# Patient Record
Sex: Female | Born: 1950 | Race: White | Hispanic: No | Smoking: Current every day smoker
Health system: Southern US, Community
[De-identification: ages and names within clinical notes are randomized; demographics above are authoritative.]

## PROBLEM LIST (undated history)

## (undated) DIAGNOSIS — J449 Chronic obstructive pulmonary disease, unspecified: Secondary | ICD-10-CM

## (undated) DIAGNOSIS — F17200 Nicotine dependence, unspecified, uncomplicated: Secondary | ICD-10-CM

## (undated) DIAGNOSIS — J189 Pneumonia, unspecified organism: Secondary | ICD-10-CM

## (undated) DIAGNOSIS — J45909 Unspecified asthma, uncomplicated: Secondary | ICD-10-CM

## (undated) DIAGNOSIS — M810 Age-related osteoporosis without current pathological fracture: Secondary | ICD-10-CM

## (undated) DIAGNOSIS — J439 Emphysema, unspecified: Secondary | ICD-10-CM

## (undated) DIAGNOSIS — T7840XA Allergy, unspecified, initial encounter: Secondary | ICD-10-CM

## (undated) DIAGNOSIS — U071 COVID-19: Secondary | ICD-10-CM

## (undated) DIAGNOSIS — F419 Anxiety disorder, unspecified: Secondary | ICD-10-CM

## (undated) DIAGNOSIS — H269 Unspecified cataract: Secondary | ICD-10-CM

## (undated) HISTORY — DX: Unspecified asthma, uncomplicated: J45.909

## (undated) HISTORY — DX: Allergy, unspecified, initial encounter: T78.40XA

## (undated) HISTORY — DX: Chronic obstructive pulmonary disease, unspecified: J44.9

## (undated) HISTORY — DX: Anxiety disorder, unspecified: F41.9

## (undated) HISTORY — PX: TONSILLECTOMY: SUR1361

## (undated) HISTORY — DX: Pneumonia, unspecified organism: J18.9

## (undated) HISTORY — DX: Emphysema, unspecified: J43.9

## (undated) HISTORY — DX: Age-related osteoporosis without current pathological fracture: M81.0

## (undated) HISTORY — DX: Unspecified cataract: H26.9

## (undated) HISTORY — DX: Nicotine dependence, unspecified, uncomplicated: F17.200

## (undated) HISTORY — DX: COVID-19: U07.1

---

## 2003-06-09 ENCOUNTER — Other Ambulatory Visit: Admission: RE | Admit: 2003-06-09 | Discharge: 2003-06-09 | Payer: Self-pay | Admitting: Family Medicine

## 2003-06-09 ENCOUNTER — Encounter: Payer: Self-pay | Admitting: Family Medicine

## 2004-08-13 ENCOUNTER — Ambulatory Visit: Payer: Self-pay | Admitting: Family Medicine

## 2004-11-01 ENCOUNTER — Ambulatory Visit: Payer: Self-pay | Admitting: Family Medicine

## 2005-02-02 ENCOUNTER — Ambulatory Visit: Payer: Self-pay | Admitting: Family Medicine

## 2005-02-11 ENCOUNTER — Ambulatory Visit: Payer: Self-pay | Admitting: Family Medicine

## 2005-03-14 ENCOUNTER — Ambulatory Visit: Payer: Self-pay | Admitting: Family Medicine

## 2005-04-12 ENCOUNTER — Ambulatory Visit: Payer: Self-pay | Admitting: Family Medicine

## 2005-09-16 ENCOUNTER — Ambulatory Visit: Payer: Self-pay | Admitting: Family Medicine

## 2006-05-16 ENCOUNTER — Ambulatory Visit: Payer: Self-pay | Admitting: Family Medicine

## 2006-05-29 ENCOUNTER — Ambulatory Visit: Payer: Self-pay | Admitting: Family Medicine

## 2006-10-24 DIAGNOSIS — J302 Other seasonal allergic rhinitis: Secondary | ICD-10-CM | POA: Insufficient documentation

## 2006-10-24 DIAGNOSIS — J309 Allergic rhinitis, unspecified: Secondary | ICD-10-CM | POA: Insufficient documentation

## 2006-10-31 ENCOUNTER — Ambulatory Visit: Payer: Self-pay | Admitting: Internal Medicine

## 2007-05-25 ENCOUNTER — Ambulatory Visit: Payer: Self-pay | Admitting: Family Medicine

## 2007-05-25 DIAGNOSIS — Z720 Tobacco use: Secondary | ICD-10-CM | POA: Insufficient documentation

## 2007-05-25 DIAGNOSIS — F17200 Nicotine dependence, unspecified, uncomplicated: Secondary | ICD-10-CM

## 2007-07-16 ENCOUNTER — Ambulatory Visit: Payer: Self-pay | Admitting: Internal Medicine

## 2007-07-16 ENCOUNTER — Encounter: Payer: Self-pay | Admitting: Family Medicine

## 2007-07-16 ENCOUNTER — Observation Stay (HOSPITAL_COMMUNITY): Admission: EM | Admit: 2007-07-16 | Discharge: 2007-07-17 | Payer: Self-pay | Admitting: Emergency Medicine

## 2007-07-16 ENCOUNTER — Telehealth (INDEPENDENT_AMBULATORY_CARE_PROVIDER_SITE_OTHER): Payer: Self-pay | Admitting: *Deleted

## 2007-07-17 ENCOUNTER — Encounter: Payer: Self-pay | Admitting: Family Medicine

## 2007-08-01 ENCOUNTER — Ambulatory Visit: Payer: Self-pay | Admitting: Family Medicine

## 2007-08-01 DIAGNOSIS — J4489 Other specified chronic obstructive pulmonary disease: Secondary | ICD-10-CM | POA: Insufficient documentation

## 2007-08-01 DIAGNOSIS — J449 Chronic obstructive pulmonary disease, unspecified: Secondary | ICD-10-CM | POA: Insufficient documentation

## 2007-08-30 ENCOUNTER — Ambulatory Visit: Payer: Self-pay | Admitting: Family Medicine

## 2007-08-30 DIAGNOSIS — J019 Acute sinusitis, unspecified: Secondary | ICD-10-CM | POA: Insufficient documentation

## 2007-09-05 ENCOUNTER — Telehealth: Payer: Self-pay | Admitting: Family Medicine

## 2008-01-21 ENCOUNTER — Ambulatory Visit: Payer: Self-pay | Admitting: Family Medicine

## 2008-01-21 DIAGNOSIS — J069 Acute upper respiratory infection, unspecified: Secondary | ICD-10-CM | POA: Insufficient documentation

## 2008-08-15 ENCOUNTER — Ambulatory Visit: Payer: Self-pay | Admitting: Family Medicine

## 2008-08-15 DIAGNOSIS — H6120 Impacted cerumen, unspecified ear: Secondary | ICD-10-CM | POA: Insufficient documentation

## 2008-09-09 ENCOUNTER — Telehealth: Payer: Self-pay | Admitting: Family Medicine

## 2009-05-29 ENCOUNTER — Encounter: Payer: Self-pay | Admitting: Family Medicine

## 2009-06-15 ENCOUNTER — Ambulatory Visit: Payer: Self-pay | Admitting: Family Medicine

## 2010-04-15 ENCOUNTER — Encounter: Payer: Self-pay | Admitting: Family Medicine

## 2010-07-27 NOTE — Miscellaneous (Signed)
Summary: flu vaccine   Clinical Lists Changes  Observations: Added new observation of FLU VAX: Historical at [facility] (04/08/2010 16:11)      Immunization History:  Influenza Immunization History:    Influenza:  historical at [facility] (04/08/2010)

## 2010-11-09 NOTE — Discharge Summary (Signed)
Sierra Brown, Sierra Brown NO.:  [id number]   MEDICAL RECORD NO.:  [id number]          PATIENT TYPE:  INP   LOCATION:  4705                         FACILITY:  [facility]   PHYSICIAN:  Valerie A. Felicity Coyer, MDDATE OF BIRTH:  [birth year]   DATE OF ADMISSION:  07/16/2007  DATE OF DISCHARGE:  07/17/2007                               DISCHARGE SUMMARY   DISCHARGE DIAGNOSES:  1. Chest pain/dyspnea on exertion, suspected secondary to mild chronic      obstructive pulmonary disease exacerbation plus or minus pleurisy.  2. Tobacco abuse.   HISTORY OF PRESENT ILLNESS:  Sierra Brown is a 60 year old female who  was admitted on 07/16/2007 with chief complaint of chest pain and  dyspnea on exertion.  She complained of sensation of a cinder block  sitting on her chest.  She also noted increased dyspnea on exertion  which had been present for 1 week prior to this admission.  She also  noted some left lateral chest discomfort which is worse when she is  smoking a cigarette.  She also complained of just no energy at all.  She reported recent upper respiratory infection/ear infection  approximately 1 month ago and stated this has not gotten better.  she  was admitted for further evaluation and treatment.   PAST MEDICAL HISTORY:  1. Tobacco abuse.  2. Underlying COPD which the patient was unaware of.   HOSPITAL COURSE:  Dyspnea on exertion/chest heaviness.  The patient was  admitted. She underwent serial cardiac enzymes. Her troponins were  negative.  Her room air saturations remain stable at 98 to 100 percent.  Chest x-ray performed on admission showed COPD and old granulomatous  disease without any acute changes.  EKG showed normal sinus rhythm  without any acute changes.  A D-dimer was performed which was within  normal limits.  A TSH was also within normal limits.  She had no white  counts and no fever.  She remained in normal sinus rhythm while on  telemetry.  She did respond to IV  Toradol as well as some nebulizer  treatments which improved her symptoms.  At this time, the patient is  stable for discharge to home.  We will initiate once daily Spiriva and I  will also give her a prescription for p.r.n. Xopenex.  Will continue  NSAIDs for 72 additional hours post discharge.   MEDICATIONS AT TIME OF DISCHARGE:  1. Ibuprofen 400 mg p.o. every 6 hours times 3 days.  2. Spiriva 18 micrograms 1 inhalation daily.  3. Xopenex 45 micrograms 2 puffs every 6 hours as needed.   DISPOSITION:  The patient is to be discharge to home .   FOLLOWUP:  She is to follow up with Dr. __________  in 1 to 2 weeks and  contact the office for an appointment.  She is also to call the  emergency room  should she develop worsening chest pain, shortness of  breath.      Sierra Craze, NP      Sierra Brown. Felicity Coyer, MD  Electronically Signed    MO/MEDQ  D:  07/17/2007  T:  07/17/2007  Job:  [id number]

## 2011-03-18 LAB — POCT CARDIAC MARKERS
CKMB, poc: 1.1
Myoglobin, poc: 57.4
Operator id: 198171
Troponin i, poc: <0.05

## 2011-03-18 LAB — I-STAT 8, (EC8 V) (CONVERTED LAB)
BUN: 11
Bicarbonate: 26.5 — ABNORMAL HIGH
Chloride: 107
Operator id: 198171
pCO2, Ven: 46.2
pH, Ven: 7.367 — ABNORMAL HIGH

## 2011-03-18 LAB — CK TOTAL AND CKMB (NOT AT ARMC)
CK, MB: 9.9 — ABNORMAL HIGH
Relative Index: 2.6 — ABNORMAL HIGH
Total CK: 374 — ABNORMAL HIGH

## 2011-03-18 LAB — DIFFERENTIAL
Basophils Absolute: 0.1
Eosinophils Absolute: 0.2
Eosinophils Relative: 3
Lymphocytes Relative: 30
Monocytes Absolute: 0.5

## 2011-03-18 LAB — CBC
HCT: 37.7
MCHC: 34.8
Platelets: 269
Platelets: 291
RBC: 4.13
RDW: 12.6
WBC: 6.7

## 2011-03-18 LAB — CARDIAC PANEL(CRET KIN+CKTOT+MB+TROPI)
CK, MB: 1.1
Total CK: 49

## 2011-03-18 LAB — POCT I-STAT CREATININE
Creatinine, Ser: 0.9
Operator id: 198171

## 2011-03-18 LAB — B-NATRIURETIC PEPTIDE (CONVERTED LAB): Pro B Natriuretic peptide (BNP): <30

## 2011-08-18 ENCOUNTER — Encounter: Payer: Self-pay | Admitting: Family Medicine

## 2011-08-18 ENCOUNTER — Ambulatory Visit (INDEPENDENT_AMBULATORY_CARE_PROVIDER_SITE_OTHER): Payer: BC Managed Care – PPO | Admitting: Family Medicine

## 2011-08-18 DIAGNOSIS — J019 Acute sinusitis, unspecified: Secondary | ICD-10-CM

## 2011-08-18 DIAGNOSIS — F17200 Nicotine dependence, unspecified, uncomplicated: Secondary | ICD-10-CM

## 2011-08-18 DIAGNOSIS — J0100 Acute maxillary sinusitis, unspecified: Secondary | ICD-10-CM

## 2011-08-18 MED ORDER — AZITHROMYCIN 250 MG PO TABS: ORAL_TABLET | ORAL | Status: AC

## 2011-08-18 NOTE — Progress Notes (Signed)
  Patient Name: Sierra Brown Date of Birth: January 06, 1951 Age: 61 y.o. Medical Record Number: [id number] Gender: female Date of Encounter: 08/18/2011  History of Present Illness:  Sierra Brown is a 61 y.o. very pleasant female patient who presents with the following:  Pulse 80 on recheck  Pressure and paon on the right, tight in chest, cough and thick. Ear pain. Taking some Allegra-D. Has been feeling bad fora week.  I initially started out mostly URI symptoms, runny nose and drainage. Now she has some quite significant and severe sinus pressure and pain, and maxillary greater than frontal on the right side.  She is having some chills and sweats. She took some Sudafed this morning and her pulses up to about 105 intermittently. She feels somewhat nauseous. She noticed this after taking her Sudafed.  Past Medical History, Surgical History, Social History, Family History, Problem List, Medications, and Allergies have been reviewed and updated if relevant.  Review of Systems: ROS: GEN: Acute illness details above GI: Tolerating PO intake GU: maintaining adequate hydration and urination Pulm: No SOB Interactive and getting along well at home.  Otherwise, ROS is as per the HPI.   Physical Examination: Filed Vitals:   08/18/11 1430  BP: 110/72  Pulse: 111  Temp: 98.1 F (36.7 C)  TempSrc: Oral  Height: 5\' 6"  (1.676 m)  Weight: 104 lb (47.174 kg)  SpO2: 95%    Body mass index is 16.79 kg/(m^2).   Gen: WDWN, NAD; alert,appropriate and cooperative throughout exam  HEENT: Normocephalic and atraumatic. Throat clear, w/o exudate, no LAD, R TM clear, L TM - good landmarks, No fluid present. rhinnorhea.  Left frontal and maxillary sinuses: non-Tender Right frontal and maxillary sinuses: Tendermax > frontal  Neck: No ant or post LAD CV: RRR, No M/G/R Pulm: Breathing comfortably in no resp distress. no w/c/r Abd: S,NT,ND,+BS Extr: no c/c/e Psych: full affect,  pleasant   Assessment and Plan: 1. Acute sinusitis  azithromycin (ZITHROMAX Z-PAK) 250 MG tablet    Acute sinusitis: ABX as below.  Refer to the patient instructions sections for details of plan shared with patient.  Reviewed symptomatic care as well as ABX in this case.

## 2013-04-14 ENCOUNTER — Ambulatory Visit: Payer: Self-pay

## 2019-06-18 ENCOUNTER — Ambulatory Visit: Payer: Medicare HMO | Attending: Internal Medicine

## 2019-06-18 DIAGNOSIS — Z20822 Contact with and (suspected) exposure to covid-19: Secondary | ICD-10-CM

## 2019-06-20 ENCOUNTER — Telehealth: Payer: Self-pay

## 2019-06-20 LAB — NOVEL CORONAVIRUS, NAA: SARS-CoV-2, NAA: NOT DETECTED

## 2019-06-20 NOTE — Telephone Encounter (Signed)
Caller given negative result and verbalized understanding  

## 2019-07-18 ENCOUNTER — Ambulatory Visit: Payer: Medicare HMO | Attending: Internal Medicine

## 2019-07-18 DIAGNOSIS — Z20822 Contact with and (suspected) exposure to covid-19: Secondary | ICD-10-CM

## 2019-07-19 LAB — NOVEL CORONAVIRUS, NAA: SARS-CoV-2, NAA: NOT DETECTED

## 2020-06-22 ENCOUNTER — Other Ambulatory Visit: Payer: Self-pay

## 2020-06-22 ENCOUNTER — Ambulatory Visit
Admission: EM | Admit: 2020-06-22 | Discharge: 2020-06-22 | Disposition: A | Discharge status: Home / Self Care | Payer: Medicare HMO | Attending: Family Medicine | Admitting: Family Medicine

## 2020-06-22 DIAGNOSIS — R030 Elevated blood-pressure reading, without diagnosis of hypertension: Secondary | ICD-10-CM

## 2020-06-22 DIAGNOSIS — R002 Palpitations: Secondary | ICD-10-CM | POA: Diagnosis not present

## 2020-06-22 DIAGNOSIS — I10 Essential (primary) hypertension: Secondary | ICD-10-CM

## 2020-06-22 NOTE — ED Triage Notes (Signed)
Pt states she didn't feel like herself and had husband check BP.  Was approx 150s/60s.  Intermittent episodes of not feeling right accompanied by nausea.  No CP.  Feels like heart is racing.  Provider notified.

## 2020-06-22 NOTE — Discharge Instructions (Signed)
Nothing concerning on your EKG today.  I am checking some basic lab work. Recommend Ensure that you are drinking fluids, stay hydrated.  Decrease caffeine, stimulants like smoking. Monitor your blood pressures throughout the day and keep a log Decrease sodium If your symptoms continue or worsen you will need to go to the ER for further evaluation.  Otherwise I am referring you to primary care for follow-up

## 2020-06-22 NOTE — ED Provider Notes (Addendum)
Renaldo Fiddler    CSN: [id number] Arrival date & time: 06/22/20  0941      History   Chief Complaint Chief Complaint  Patient presents with   Hypertension    HPI MARITSSA HAUGHTON is a 69 y.o. female.   Patient is a 69 year old female past medical history of pneumonia, COPD, tobacco use, allergy.  She presents today with complaints of elevated blood pressure readings at home, feels like her heart is racing at times.  She has had some mild nausea.  No chest pain, shortness of breath, cough, chest congestion, fevers.  No dizziness, headaches, blurred vision.  No trouble with speech, weakness in extremities, numbness, tingling.  No urinary symptoms.  She has been under a mild amount of stress recently with her husband had a car accident.  Blood pressures have been running in the 150 systolic at home.  No known history of high blood pressure.  Has not seen a primary care provider" a while".  Current everyday smoker.      Past Medical History:  Diagnosis Date   Allergy    allerlgic rhinitis   COPD (chronic obstructive pulmonary disease) (HCC)    Pneumonia    Tobacco use disorder     Patient Active Problem List   Diagnosis Date Noted   CERUMEN IMPACTION, BILATERAL 08/15/2008   URI 01/21/2008   SINUSITIS- ACUTE-NOS 08/30/2007   COPD 08/01/2007   TOBACCO ABUSE 05/25/2007   ALLERGIC RHINITIS 10/24/2006    Past Surgical History:  Procedure Laterality Date   CESAREAN SECTION     TONSILLECTOMY      OB History   No obstetric history on file.      Home Medications    Prior to Admission medications   Not on File    Family History Family History  Problem Relation Age of Onset   Ulcerative colitis Mother        colostomy    Social History Social History   Tobacco Use   Smoking status: Smoker, Current Status Unknown     Allergies   Codeine, Levofloxacin, Penicillins, and Sulfonamide derivatives   Review of Systems Review of  Systems   Physical Exam Triage Vital Signs ED Triage Vitals  Enc Vitals Group     BP 06/22/20 1010 (!) 164/80     Pulse Rate 06/22/20 1010 79     Resp 06/22/20 1010 16     Temp 06/22/20 1010 98.2 F (36.8 C)     Temp Source 06/22/20 1010 Oral     SpO2 06/22/20 1010 97 %     Weight --      Height --      Head Circumference --      Peak Flow --      Pain Score 06/22/20 1030 0     Pain Loc --      Pain Edu? --      Excl. in GC? --    No data found.  Updated Vital Signs BP (!) 164/80 (BP Location: Right Arm)    Pulse 79    Temp 98.2 F (36.8 C) (Oral)    Resp 16    SpO2 97%   Visual Acuity Right Eye Distance:   Left Eye Distance:   Bilateral Distance:    Right Eye Near:   Left Eye Near:    Bilateral Near:     Physical Exam Vitals and nursing note reviewed.  Constitutional:      General: She is not in acute  distress.    Appearance: Normal appearance. She is not ill-appearing, toxic-appearing or diaphoretic.  HENT:     Head: Normocephalic.     Nose: Nose normal.  Eyes:     Conjunctiva/sclera: Conjunctivae normal.  Cardiovascular:     Rate and Rhythm: Normal rate and regular rhythm.     Heart sounds: Normal heart sounds.  Pulmonary:     Effort: Pulmonary effort is normal.     Breath sounds: Normal breath sounds.  Musculoskeletal:        General: Normal range of motion.     Cervical back: Normal range of motion.  Skin:    General: Skin is warm and dry.     Findings: No rash.  Neurological:     General: No focal deficit present.     Mental Status: She is alert.  Psychiatric:        Mood and Affect: Mood normal.      UC Treatments / Results  Labs (all labs ordered are listed, but only abnormal results are displayed) Labs Reviewed  NOVEL CORONAVIRUS, NAA  TSH  CBC WITH DIFFERENTIAL/PLATELET  COMPREHENSIVE METABOLIC PANEL    EKG   Radiology No results found.  Procedures Procedures (including critical care time)  Medications Ordered in  UC Medications - No data to display  Initial Impression / Assessment and Plan / UC Course  I have reviewed the triage vital signs and the nursing notes.  Pertinent labs & imaging results that were available during my care of the patient were reviewed by me and considered in my medical decision making (see chart for details).     Palpitations, elevated blood pressure reading.  Blood pressure 164/80 here today.  She has been running in the 150s at home consistently. We will have her continue monitoring these at home. She is not having any concerning signs or symptoms today to include chest pain, shortness of breath, lower extremity swelling or edema EKG with normal sinus rhythm, normal rate.  Nothing concerning today.  No concern for ACS or PE today. Recommended decrease caffeine, stimulants like smoking cigarettes. Decrease sodium.  Make sure she stays hydrated. For any continued or worsening problems she will need to go to the ER. Referral placed for primary care Covid test pending Labs pending  Final Clinical Impressions(s) / UC Diagnoses   Final diagnoses:  Palpitations  Elevated BP without diagnosis of hypertension     Discharge Instructions     Nothing concerning on your EKG today.  I am checking some basic lab work. Recommend Ensure that you are drinking fluids, stay hydrated.  Decrease caffeine, stimulants like smoking. Monitor your blood pressures throughout the day and keep a log Decrease sodium If your symptoms continue or worsen you will need to go to the ER for further evaluation.  Otherwise I am referring you to primary care for follow-up    ED Prescriptions    None     PDMP not reviewed this encounter.      Janace Aris, NP 06/22/20 1058

## 2020-06-23 LAB — COMPREHENSIVE METABOLIC PANEL
ALT: 8 IU/L (ref 0–32)
AST: 14 IU/L (ref 0–40)
Albumin/Globulin Ratio: 2 (ref 1.2–2.2)
Albumin: 4.4 g/dL (ref 3.8–4.8)
Alkaline Phosphatase: 99 IU/L (ref 44–121)
BUN/Creatinine Ratio: 17 (ref 12–28)
BUN: 13 mg/dL (ref 8–27)
Bilirubin Total: 0.3 mg/dL (ref 0.0–1.2)
CO2: 21 mmol/L (ref 20–29)
Calcium: 9.6 mg/dL (ref 8.7–10.3)
Chloride: 105 mmol/L (ref 96–106)
Creatinine, Ser: 0.77 mg/dL (ref 0.57–1.00)
GFR calc Af Amer: 92 mL/min/{1.73_m2} (ref >59)
GFR calc non Af Amer: 80 mL/min/{1.73_m2} (ref >59)
Globulin, Total: 2.2 g/dL (ref 1.5–4.5)
Glucose: 88 mg/dL (ref 65–99)
Potassium: 4.5 mmol/L (ref 3.5–5.2)
Sodium: 141 mmol/L (ref 134–144)
Total Protein: 6.6 g/dL (ref 6.0–8.5)

## 2020-06-23 LAB — CBC WITH DIFFERENTIAL/PLATELET
Basophils Absolute: 0.1 10*3/uL (ref 0.0–0.2)
Basos: 1 %
EOS (ABSOLUTE): 0.1 10*3/uL (ref 0.0–0.4)
Eos: 1 %
Hematocrit: 42.3 % (ref 34.0–46.6)
Hemoglobin: 14.3 g/dL (ref 11.1–15.9)
Immature Grans (Abs): 0 10*3/uL (ref 0.0–0.1)
Immature Granulocytes: 0 %
Lymphocytes Absolute: 1.2 10*3/uL (ref 0.7–3.1)
Lymphs: 14 %
MCH: 31.6 pg (ref 26.6–33.0)
MCHC: 33.8 g/dL (ref 31.5–35.7)
MCV: 93 fL (ref 79–97)
Monocytes Absolute: 0.6 10*3/uL (ref 0.1–0.9)
Monocytes: 8 %
Neutrophils Absolute: 6.3 10*3/uL (ref 1.4–7.0)
Neutrophils: 76 %
Platelets: 287 10*3/uL (ref 150–450)
RBC: 4.53 x10E6/uL (ref 3.77–5.28)
RDW: 12 % (ref 11.7–15.4)
WBC: 8.3 10*3/uL (ref 3.4–10.8)

## 2020-06-23 LAB — TSH: TSH: 1.02 u[IU]/mL (ref 0.450–4.500)

## 2020-06-24 LAB — SARS-COV-2, NAA 2 DAY TAT

## 2020-06-24 LAB — NOVEL CORONAVIRUS, NAA: SARS-CoV-2, NAA: NOT DETECTED

## 2020-08-04 NOTE — Progress Notes (Addendum)
New patient visit   Patient: Sierra Brown   DOB: 02/20/1951   70 y.o. Female  MRN: [id number] Visit Date: 08/05/2020  Today's healthcare provider: Jairo Ben, FNP   Chief Complaint  Patient presents with  . New Patient (Initial Visit)   Subjective    [name] is a 70 y.o. female who presents today as a new patient to establish care.  HPI  Patient presents in office today accompanied by her husband. Patient reports that she feels poorly today and does have concerns to address. Patients spouse reports that for over the past 6 weeks or more patient has complained of heart palpitations and elevated heart rate.Patient reports that she was seen at walk in clinic for evaluation and had ekg and labs drawn. Patient denies chest pain but reports tightness feeling in her left arm, weakness, malaise and shortness of breath on exertion. Patients husband reports that blood pressure readings at home show a systolic of 160 and diastolic ranging anywhere between 52-110. Patient reports decreased appetite due to ongoing nausea, unexplained weight loss, sleeping over 12hrs a days and increased stress.    seen in ED on 06/22/21 for palpitations.  She has not had pain in chest. She has had papitations and she has had some since she was seen at the ED.  She is having palpitations everyday. Lasts 1- 2 days sometimes, and has no energy during this time. Husband " says this is not her"   She has nausea. she has been taking mecleazine and ensure.  Gatorade.  She has caffiene daily. she is trying to cut out caffiene 1- 3 coffee in the morning one pepsi in the evening.  smoker 1-2 ppd for over thirty years.  She has some stress looking after her mom.  She does not relate the palpittaions with the anxiety. she does have alot on her place.  She at times feels like her left arm has a tight blood pressure cuff on it.   She is busy.  She has a tooth that is bothering her, lower tooth   bothers her sometimes, not hurting now.  mother with atrial fibrillation.  She denies any current palpitations, or current , Patient  denies any fever, body aches,chills, rash, chest pain, shortness of breath, nausea, vomiting, or diarrhea.  Denies dizziness, lightheadedness, pre syncopal or syncopal episodes.     Past Medical History:  Diagnosis Date  . Allergy    allerlgic rhinitis  . Cataract   . COPD (chronic obstructive pulmonary disease) (HCC)   . Pneumonia   . Tobacco use disorder    Past Surgical History:  Procedure Laterality Date  . CESAREAN SECTION    . TONSILLECTOMY     Family Status  Relation Name Status  . Mother  (Not Specified)       colostomy  . Neg Hx  (Not Specified)   Family History  Problem Relation Age of Onset  . Ulcerative colitis Mother        colostomy  . Hypertension Mother   . Heart Problems Mother   . Breast cancer Neg Hx    Social History   Socioeconomic History  . Marital status: Married    Spouse name: Not on file  . Number of children: Not on file  . Years of education: Not on file  . Highest education level: Not on file  Occupational History  . Not on file  Tobacco Use  . Smoking status: Current Every Day Smoker  Packs/day: 0.75    Years: 49.00    Pack years: 36.75    Types: Cigarettes  . Smokeless tobacco: Never Used  Substance and Sexual Activity  . Alcohol use: Not on file  . Drug use: Not on file  . Sexual activity: Not on file  Other Topics Concern  . Not on file  Social History Narrative  . Not on file   Social Determinants of Health   Financial Resource Strain: Not on file  Food Insecurity: Not on file  Transportation Needs: Not on file  Physical Activity: Not on file  Stress: Not on file  Social Connections: Not on file   Outpatient Medications Prior to Visit  Medication Sig  . fluticasone (FLONASE) 50 MCG/ACT nasal spray Place into the nose.  . Loratadine 10 MG CAPS Take by mouth.   No  facility-administered medications prior to visit.   Allergies  Allergen Reactions  . Codeine     REACTION: nausea and vomiting Other reaction(s): Other (See Comments) REACTION: nausea and vomiting  . Levofloxacin     REACTION: nausea Other reaction(s): Other (See Comments) REACTION: nausea  . Sulfonamide Derivatives     REACTION: hives  . Penicillins Rash    REACTION: hives Other reaction(s): Other (See Comments) REACTION: hives   . Sulfa Antibiotics Rash    REACTION: hives    Immunization History  Administered Date(s) Administered  . Influenza Whole 03/28/2007, 04/08/2010  . Influenza-Unspecified 03/27/2013  . Pneumococcal Polysaccharide-23 08/01/2007  . Td 06/27/1997    Health Maintenance  Topic Date Due  . Hepatitis C Screening  Never done  . COVID-19 Vaccine (1) Never done  . COLONOSCOPY (Pts 45-5866yrs Insurance coverage will need to be confirmed)  Never done  . TETANUS/TDAP  06/28/2007  . DEXA SCAN  Never done  . PNA vac Low Risk Adult (1 of 2 - PCV13) 05/08/2018  . INFLUENZA VACCINE  01/26/2020  . MAMMOGRAM  09/02/2022  . HPV VACCINES  Aged Out    Patient Care Team: Idris Edmundson, Eula FriedMichelle S, FNP as PCP - General (Family Medicine)  Review of Systems  Constitutional: Positive for appetite change (decreased. ), fatigue and unexpected weight change. Negative for activity change, chills, diaphoresis and fever.  HENT: Negative for congestion, dental problem, drooling, ear discharge, ear pain, facial swelling, hearing loss, mouth sores, nosebleeds, postnasal drip, rhinorrhea, sinus pressure, sinus pain, sneezing, trouble swallowing and voice change.   Respiratory: Positive for shortness of breath. Negative for apnea, cough, choking, chest tightness, wheezing and stridor.   Cardiovascular: Positive for palpitations. Negative for chest pain and leg swelling.  Gastrointestinal: Positive for nausea.  Genitourinary: Negative.   Musculoskeletal: Positive for arthralgias.     Last CBC Lab Results  Component Value Date   WBC 10.9 (H) 08/05/2020   HGB 14.7 08/05/2020   HCT 43.2 08/05/2020   MCV 93 08/05/2020   MCH 31.5 08/05/2020   RDW 11.8 08/05/2020   PLT 335 08/05/2020   Last metabolic panel Lab Results  Component Value Date   GLUCOSE 68 08/05/2020   NA 141 08/05/2020   K 4.8 08/05/2020   CL 102 08/05/2020   CO2 23 08/05/2020   BUN 13 08/05/2020   CREATININE 0.76 08/05/2020   GFRNONAA 81 08/05/2020   GFRAA 94 08/05/2020   CALCIUM 10.0 08/05/2020   PROT 7.0 08/05/2020   ALBUMIN 4.7 08/05/2020   LABGLOB 2.3 08/05/2020   AGRATIO 2.0 08/05/2020   BILITOT 0.4 08/05/2020   ALKPHOS 94 08/05/2020   AST  17 08/05/2020   ALT 13 08/05/2020   Last lipids Lab Results  Component Value Date   CHOL 201 (H) 08/05/2020   HDL 56 08/05/2020   LDLCALC 111 (H) 08/05/2020   TRIG 196 (H) 08/05/2020   CHOLHDL 3.6 08/05/2020   Last hemoglobin A1c No results found for: HGBA1C Last thyroid functions Lab Results  Component Value Date   TSH 0.999 08/05/2020   Last vitamin D Lab Results  Component Value Date   VD25OH 23.3 (L) 08/05/2020   Last vitamin B12 and Folate Lab Results  Component Value Date   VITAMINB12 337 08/05/2020   FOLATE >20.0 08/05/2020      Objective    BP (!) 148/72   Pulse 93   Temp 98.2 F (36.8 C) (Oral)   Resp 18   Ht 5\' 4"  (1.626 m)   Wt 101 lb 6.4 oz (46 kg)   SpO2 98%   BMI 17.41 kg/m  Physical Exam Vitals and nursing note reviewed.  Constitutional:      General: She is not in acute distress.    Appearance: Normal appearance. She is not ill-appearing, toxic-appearing or diaphoretic.     Comments: Patient is alert and oriented and responsive to questions Engages in eye contact with provider. Speaks in full sentences without any pauses without any shortness of breath or distress.    HENT:     Head: Normocephalic and atraumatic.     Right Ear: Tympanic membrane, ear canal and external ear normal.     Left  Ear: Tympanic membrane, ear canal and external ear normal.     Nose: Nose normal.     Mouth/Throat:     Mouth: Mucous membranes are moist.     Pharynx: Oropharynx is clear.  Eyes:     General: No scleral icterus.       Right eye: No discharge.        Left eye: No discharge.     Conjunctiva/sclera: Conjunctivae normal.     Pupils: Pupils are equal, round, and reactive to light.  Neck:     Vascular: Carotid bruit (mild on right turbulence heard. ) present.  Cardiovascular:     Rate and Rhythm: Normal rate and regular rhythm.     Pulses: Normal pulses.     Heart sounds: Normal heart sounds. No murmur heard. No friction rub. No gallop.   Pulmonary:     Effort: Pulmonary effort is normal. No respiratory distress.     Breath sounds: Normal breath sounds. No stridor. No wheezing, rhonchi or rales.  Chest:     Chest wall: No tenderness.  Abdominal:     General: Bowel sounds are normal. There is no distension.     Palpations: Abdomen is soft. There is no mass.     Tenderness: There is no abdominal tenderness. There is no right CVA tenderness, left CVA tenderness, guarding or rebound.     Hernia: No hernia is present.  Genitourinary:    Comments: Deferred.  Musculoskeletal:        General: No tenderness. Normal range of motion.     Cervical back: Normal range of motion and neck supple.     Right lower leg: No edema.     Left lower leg: No edema.  Lymphadenopathy:     Cervical: No cervical adenopathy.  Skin:    General: Skin is warm.     Findings: No erythema, lesion or rash.  Neurological:     General: No focal deficit present.  Mental Status: She is alert and oriented to person, place, and time.     Motor: No weakness.     Gait: Gait normal.  Psychiatric:        Mood and Affect: Mood normal.        Behavior: Behavior normal.        Thought Content: Thought content normal.        Judgment: Judgment normal.      Depression Screen PHQ 2/9 Scores 08/05/2020  PHQ - 2 Score  0  PHQ- 9 Score 7   Results for orders placed or performed in visit on 08/05/20  CBC with Differential/Platelet  Result Value Ref Range   WBC 10.9 (H) 3.4 - 10.8 x10E3/uL   RBC 4.66 3.77 - 5.28 x10E6/uL   Hemoglobin 14.7 11.1 - 15.9 g/dL   Hematocrit 13.0 86.5 - 46.6 %   MCV 93 79 - 97 fL   MCH 31.5 26.6 - 33.0 pg   MCHC 34.0 31.5 - 35.7 g/dL   RDW 78.4 69.6 - 29.5 %   Platelets 335 150 - 450 x10E3/uL   Neutrophils 74 Not Estab. %   Lymphs 15 Not Estab. %   Monocytes 8 Not Estab. %   Eos 1 Not Estab. %   Basos 1 Not Estab. %   Neutrophils Absolute 8.2 (H) 1.4 - 7.0 x10E3/uL   Lymphocytes Absolute 1.6 0.7 - 3.1 x10E3/uL   Monocytes Absolute 0.9 0.1 - 0.9 x10E3/uL   EOS (ABSOLUTE) 0.1 0.0 - 0.4 x10E3/uL   Basophils Absolute 0.1 0.0 - 0.2 x10E3/uL   Immature Granulocytes 1 Not Estab. %   Immature Grans (Abs) 0.1 0.0 - 0.1 x10E3/uL  Comprehensive metabolic panel  Result Value Ref Range   Glucose 68 65 - 99 mg/dL   BUN 13 8 - 27 mg/dL   Creatinine, Ser 2.84 0.57 - 1.00 mg/dL   GFR calc non Af Amer 81 >59 mL/min/1.73   GFR calc Af Amer 94 >59 mL/min/1.73   BUN/Creatinine Ratio 17 12 - 28   Sodium 141 134 - 144 mmol/L   Potassium 4.8 3.5 - 5.2 mmol/L   Chloride 102 96 - 106 mmol/L   CO2 23 20 - 29 mmol/L   Calcium 10.0 8.7 - 10.3 mg/dL   Total Protein 7.0 6.0 - 8.5 g/dL   Albumin 4.7 3.8 - 4.8 g/dL   Globulin, Total 2.3 1.5 - 4.5 g/dL   Albumin/Globulin Ratio 2.0 1.2 - 2.2   Bilirubin Total 0.4 0.0 - 1.2 mg/dL   Alkaline Phosphatase 94 44 - 121 IU/L   AST 17 0 - 40 IU/L   ALT 13 0 - 32 IU/L  Lipid panel  Result Value Ref Range   Cholesterol, Total 201 (H) 100 - 199 mg/dL   Triglycerides 132 (H) 0 - 149 mg/dL   HDL 56 >44 mg/dL   VLDL Cholesterol Cal 34 5 - 40 mg/dL   LDL Chol Calc (NIH) 010 (H) 0 - 99 mg/dL   Chol/HDL Ratio 3.6 0.0 - 4.4 ratio  TSH  Result Value Ref Range   TSH 0.999 0.450 - 4.500 uIU/mL  Lyme Ab/Western Blot Reflex  Result Value Ref Range   Lyme  IgG/IgM Ab <0.91 0.00 - 0.90 ISR   LYME DISEASE AB, QUANT, IGM <0.80 0.00 - 0.79 index  VITAMIN D 25 Hydroxy (Vit-D Deficiency, Fractures)  Result Value Ref Range   Vit D, 25-Hydroxy 23.3 (L) 30.0 - 100.0 ng/mL  Urine Microscopic  Result Value Ref  Range   WBC, UA None seen 0 - 5 /hpf   RBC 0-2 0 - 2 /hpf   Epithelial Cells (non renal) None seen 0 - 10 /hpf   Casts None seen None seen /lpf   Bacteria, UA None seen None seen/Few  B12 and Folate Panel  Result Value Ref Range   Vitamin B-12 337 232 - 1,245 pg/mL   Folate >20.0 >3.0 ng/mL  POCT urinalysis dipstick  Result Value Ref Range   Color, UA yellow    Clarity, UA clear    Glucose, UA Negative Negative   Bilirubin, UA negative    Ketones, UA negative    Spec Grav, UA <=1.005 (A) 1.010 - 1.025   Blood, UA non hemolyzed moderate    pH, UA 5.0 5.0 - 8.0   Protein, UA Negative Negative   Urobilinogen, UA 0.2 0.2 or 1.0 E.U./dL   Nitrite, UA negative    Leukocytes, UA Negative Negative   Appearance     Odor      Assessment & Plan       Bruit of right carotid artery - Plan: CBC with Differential/Platelet, Comprehensive metabolic panel, Lipid panel, TSH, Ambulatory referral to Cardiology, hydrochlorothiazide (HYDRODIURIL) 12.5 MG tablet, aspirin EC 81 MG tablet  Screening for blood or protein in urine - Plan: POCT urinalysis dipstick  History of palpitations in adulthood  Tobacco abuse - Plan: CT CHEST LUNG CA SCREEN LOW DOSE W/O CM  Palpitations - Plan: B12 and Folate Panel, US Carotid Duplex Bilateral, EKG 12-Lead  Tick bite, unspecified site, sequela - Plan: Lyme Ab/Western Blot Reflex, VITAMIN D 25 Hydroxy (Vit-D Deficiency, Fractures)  Screening mammogram for breast cancer - Plan: MM 3D SCREEN BREAST BILATERAL, CANCELED: MM Digital Screening  Nausea - Plan: Ambulatory referral to Gastroenterology, omeprazole (PRILOSEC) 20 MG capsule, B12 and Folate Panel  Dental infection  Hematuria, unspecified type - Plan:  Urine Microscopic  . Meds ordered this encounter  Medications  . DISCONTD: clarithromycin (BIAXIN) 500 MG tablet    Sig: Take 1 tablet (500 mg total) by mouth 2 (two) times daily.    Dispense:  20 tablet    Refill:  0  . hydrochlorothiazide (HYDRODIURIL) 12.5 MG tablet    Sig: Take 1 tablet (12.5 mg total) by mouth daily.    Dispense:  90 tablet    Refill:  0  . omeprazole (PRILOSEC) 20 MG capsule    Sig: Take 1 capsule (20 mg total) by mouth daily.    Dispense:  90 capsule    Refill:  0  . aspirin EC 81 MG tablet    Sig: Take 1 tablet (81 mg total) by mouth daily. Swallow whole.    Dispense:  90 tablet    Refill:  2   EKG reviewed by me, improved since previous in ED. Non specific changes.   She will seek dental care for tooth that is hurting, she denies ay jaw pain.  She denies any pain during office visit.    Orders Placed This Encounter  Procedures  . CT CHEST LUNG CA SCREEN LOW DOSE W/O CM    Order Specific Question:   Reason for Exam (SYMPTOM  OR DIAGNOSIS REQUIRED)    Answer:   history of smoking  . US Carotid Duplex Bilateral    Order Specific Question:   Reason for exam:    Answer:   mild left carotid bruit heard.    Order Specific Question:   Preferred imaging location?  Answer:   [facility]  . MM 3D SCREEN BREAST BILATERAL    Order Specific Question:   Reason for Exam (SYMPTOM  OR DIAGNOSIS REQUIRED)    Answer:   screening    Order Specific Question:   Preferred imaging location?    Answer:   [facility]  . CBC with Differential/Platelet  . Comprehensive metabolic panel  . Lipid panel  . TSH  . Lyme Ab/Western Blot Reflex  . VITAMIN D 25 Hydroxy (Vit-D Deficiency, Fractures)  . B12 and Folate Panel  . Urine Microscopic  . B12 and Folate Panel  . Ambulatory referral to Cardiology    Referral Priority:   Urgent    Referral Type:   Consultation    Referral Reason:   Specialty Services Required    Referred to Provider:   Chrystie Nose,  MD    Requested Specialty:   Cardiology    Number of Visits Requested:   1  . Ambulatory referral to Gastroenterology    Referral Priority:   Routine    Referral Type:   Consultation    Referral Reason:   Specialty Services Required    Referred to Provider:   Midge Minium, MD    Number of Visits Requested:   1  . POCT urinalysis dipstick  . EKG 12-Lead   Return in about 1 week (around 08/12/2020), or if symptoms worsen or fail to improve, for at any time for any worsening symptoms, Go to Emergency room/ urgent care if worse.      The entirety of the information documented in the History of Present Illness, Review of Systems and Physical Exam were personally obtained by me. Portions of this information were initially documented by the CMA and reviewed by me for thoroughness and accuracy.   Red Flags discussed. The patient was given clear instructions to go to ER or return to medical center if any red flags develop, symptoms do not improve, worsen or new problems develop. They verbalized understanding.    Jairo Ben, FNP  [facility] [phone] (phone) [phone] (fax)  [facility]

## 2020-08-05 ENCOUNTER — Other Ambulatory Visit: Payer: Self-pay

## 2020-08-05 ENCOUNTER — Ambulatory Visit (INDEPENDENT_AMBULATORY_CARE_PROVIDER_SITE_OTHER): Payer: Medicare HMO | Admitting: Adult Health

## 2020-08-05 ENCOUNTER — Encounter: Payer: Self-pay | Admitting: Adult Health

## 2020-08-05 VITALS — BP 148/72 | HR 93 | Temp 98.2°F | Resp 18 | Ht 64.0 in | Wt 101.4 lb

## 2020-08-05 DIAGNOSIS — R110 Nausea: Secondary | ICD-10-CM

## 2020-08-05 DIAGNOSIS — W57XXXS Bitten or stung by nonvenomous insect and other nonvenomous arthropods, sequela: Secondary | ICD-10-CM | POA: Diagnosis not present

## 2020-08-05 DIAGNOSIS — R0989 Other specified symptoms and signs involving the circulatory and respiratory systems: Secondary | ICD-10-CM | POA: Insufficient documentation

## 2020-08-05 DIAGNOSIS — R319 Hematuria, unspecified: Secondary | ICD-10-CM | POA: Diagnosis not present

## 2020-08-05 DIAGNOSIS — W57XXXA Bitten or stung by nonvenomous insect and other nonvenomous arthropods, initial encounter: Secondary | ICD-10-CM | POA: Insufficient documentation

## 2020-08-05 DIAGNOSIS — Z8679 Personal history of other diseases of the circulatory system: Secondary | ICD-10-CM

## 2020-08-05 DIAGNOSIS — Z720 Tobacco use: Secondary | ICD-10-CM

## 2020-08-05 DIAGNOSIS — R002 Palpitations: Secondary | ICD-10-CM | POA: Diagnosis not present

## 2020-08-05 DIAGNOSIS — Z1231 Encounter for screening mammogram for malignant neoplasm of breast: Secondary | ICD-10-CM | POA: Diagnosis not present

## 2020-08-05 DIAGNOSIS — K047 Periapical abscess without sinus: Secondary | ICD-10-CM | POA: Insufficient documentation

## 2020-08-05 DIAGNOSIS — Z1389 Encounter for screening for other disorder: Secondary | ICD-10-CM | POA: Insufficient documentation

## 2020-08-05 LAB — POCT URINALYSIS DIPSTICK
Bilirubin, UA: NEGATIVE
Glucose, UA: NEGATIVE
Ketones, UA: NEGATIVE
Leukocytes, UA: NEGATIVE
Nitrite, UA: NEGATIVE
Protein, UA: NEGATIVE
Spec Grav, UA: <=1.005 — AB (ref 1.010–1.025)
Urobilinogen, UA: 0.2 E.U./dL
pH, UA: 5 (ref 5.0–8.0)

## 2020-08-05 MED ORDER — HYDROCHLOROTHIAZIDE 12.5 MG PO TABS: 12.5000 mg | ORAL_TABLET | Freq: Every day | ORAL | 0 refills | Status: DC

## 2020-08-05 MED ORDER — CLARITHROMYCIN 500 MG PO TABS: 500.0000 mg | ORAL_TABLET | Freq: Two times a day (BID) | ORAL | 0 refills | Status: DC

## 2020-08-05 MED ORDER — ASPIRIN EC 81 MG PO TBEC: 81.0000 mg | DELAYED_RELEASE_TABLET | Freq: Every day | ORAL | 2 refills | Status: DC

## 2020-08-05 MED ORDER — OMEPRAZOLE 20 MG PO CPDR
20.0000 mg | DELAYED_RELEASE_CAPSULE | Freq: Every day | ORAL | 0 refills | Status: DC
Start: 2020-08-05 — End: 2021-02-15

## 2020-08-05 NOTE — Patient Instructions (Signed)
Call to schedule your screening mammogram. Your orders have been placed for your exam.  Let our office know if you have questions, concerns, or any difficulty scheduling.  If normal results then yearly screening mammograms are recommended unless you notice  Changes in your breast then you should schedule a follow up office visit. If abnormal results  Further imaging will be warranted and sooner follow up as determined by the radiologist at the [facility].   [facility] [address] [place], [place] [address]  Main: [phone]     Palpitations Palpitations are feelings that your heartbeat is not normal. Your heartbeat may feel like it is:  Uneven.  Faster than normal.  Fluttering.  Skipping a beat. This is usually not a serious problem. In some cases, you may need tests to rule out any serious problems. Follow these instructions at home: Pay attention to any changes in your condition. Take these actions to help manage your symptoms: Eating and drinking  Avoid: ? Coffee, tea, soft drinks, and energy drinks. ? Chocolate. ? Alcohol. ? Diet pills. Lifestyle  Try to lower your stress. These things can help you relax: ? Yoga. ? Deep breathing and meditation. ? Exercise. ? Using words and images to create positive thoughts (guided imagery). ? Using your mind to control things in your body (biofeedback).  Do not use drugs.  Get plenty of rest and sleep. Keep a regular bed time.   General instructions  Take over-the-counter and prescription medicines only as told by your doctor.  Do not use any products that contain nicotine or tobacco, such as cigarettes and e-cigarettes. If you need help quitting, ask your doctor.  Keep all follow-up visits as told by your doctor. This is important. You may need more tests if palpitations do not go away or get worse.   Contact a doctor if:  Your symptoms last more than 24 hours.  Your symptoms  occur more often. Get help right away if you:  Have chest pain.  Feel short of breath.  Have a very bad headache.  Feel dizzy.  Pass out (faint). Summary  Palpitations are feelings that your heartbeat is uneven or faster than normal. It may feel like your heart is fluttering or skipping a beat.  Avoid food and drinks that may cause palpitations. These include caffeine, chocolate, and alcohol.  Try to lower your stress. Do not smoke or use drugs.  Get help right away if you faint or have chest pain, shortness of breath, a severe headache, or dizziness. This information is not intended to replace advice given to you by your health care provider. Make sure you discuss any questions you have with your health care provider. Document Revised: 07/26/2017 Document Reviewed: 07/26/2017 Elsevier Patient Education  2021 [employer].

## 2020-08-06 ENCOUNTER — Other Ambulatory Visit: Payer: Self-pay | Admitting: Adult Health

## 2020-08-06 DIAGNOSIS — E559 Vitamin D deficiency, unspecified: Secondary | ICD-10-CM

## 2020-08-06 DIAGNOSIS — D72829 Elevated white blood cell count, unspecified: Secondary | ICD-10-CM

## 2020-08-06 DIAGNOSIS — E538 Deficiency of other specified B group vitamins: Secondary | ICD-10-CM

## 2020-08-06 LAB — URINALYSIS, MICROSCOPIC ONLY
Bacteria, UA: NONE SEEN
Casts: NONE SEEN /lpf
Epithelial Cells (non renal): NONE SEEN /hpf (ref 0–10)
WBC, UA: NONE SEEN /hpf (ref 0–5)

## 2020-08-06 MED ORDER — VITAMIN D (ERGOCALCIFEROL) 1.25 MG (50000 UNIT) PO CAPS: 50000.0000 [IU] | ORAL_CAPSULE | ORAL | 0 refills | Status: DC

## 2020-08-06 MED ORDER — B-12 1000 MCG SL SUBL: 1.0000 | SUBLINGUAL_TABLET | Freq: Every day | SUBLINGUAL | 1 refills | Status: DC

## 2020-08-06 NOTE — Progress Notes (Signed)
Meds ordered this encounter  Medications  . Vitamin D, Ergocalciferol, (DRISDOL) 1.25 MG (50000 UNIT) CAPS capsule    Sig: Take 1 capsule (50,000 Units total) by mouth every 7 (seven) days. (taking one tablet per week) walk in lab in office 1-2 weeks after completing prescription.    Dispense:  12 capsule    Refill:  0  . Cyanocobalamin (B-12) 1000 MCG SUBL    Sig: Place 1 tablet under the tongue daily.    Dispense:  90 tablet    Refill:  1   Cbc in one month = 09/03/20 others  In 3 months around 11/03/20. Orders Placed This Encounter  Procedures  . CBC  . VITAMIN D 25 Hydroxy (Vit-D Deficiency, Fractures)    Standing Status:   Future    Standing Expiration Date:   12/04/2020  . B12    Standing Status:   Future    Standing Expiration Date:   12/04/2020

## 2020-08-06 NOTE — Progress Notes (Signed)
CBC has mild elevation in WBC and neutrophils, take antibiotic for suspected dental infection and lets repeat CBC in one month.  CMP is within normal limits.  Total cholesterol. Triglycerides and LDL elevated.  Discuss lifestyle modification with patient e.g. increase exercise, fiber, fruits, vegetables, lean meat, and omega 3/fish intake and decrease saturated fat.  If patient following strict diet and exercise program already please schedule follow up appointment with primary care physician TSH within normal limits.   Vitamin  D is low, this can contribute to poor sleep and fatigue, will send in prescription for Vitamin D at 50,000 units by mouth once every 7 days/(once weekly) for 12 weeks. Advise recheck lab Vitamin D in 1-2 weeks after completing vitamin d prescription. Lab iis walk in and is closed during lunch during regular office hours.  B12 is low end normal will send in prescription to see if helps with energy.  Urine microscopic within normal.  Lyme test still pending.   Recheck CBC in one months.  Will need recheck on vitamin D and b12 in 3- 4 months.

## 2020-08-07 LAB — COMPREHENSIVE METABOLIC PANEL
ALT: 13 IU/L (ref 0–32)
AST: 17 IU/L (ref 0–40)
Albumin/Globulin Ratio: 2 (ref 1.2–2.2)
Albumin: 4.7 g/dL (ref 3.8–4.8)
Alkaline Phosphatase: 94 IU/L (ref 44–121)
BUN/Creatinine Ratio: 17 (ref 12–28)
BUN: 13 mg/dL (ref 8–27)
Bilirubin Total: 0.4 mg/dL (ref 0.0–1.2)
CO2: 23 mmol/L (ref 20–29)
Calcium: 10 mg/dL (ref 8.7–10.3)
Chloride: 102 mmol/L (ref 96–106)
Creatinine, Ser: 0.76 mg/dL (ref 0.57–1.00)
GFR calc Af Amer: 94 mL/min/{1.73_m2} (ref >59)
GFR calc non Af Amer: 81 mL/min/{1.73_m2} (ref >59)
Globulin, Total: 2.3 g/dL (ref 1.5–4.5)
Glucose: 68 mg/dL (ref 65–99)
Potassium: 4.8 mmol/L (ref 3.5–5.2)
Sodium: 141 mmol/L (ref 134–144)
Total Protein: 7 g/dL (ref 6.0–8.5)

## 2020-08-07 LAB — CBC WITH DIFFERENTIAL/PLATELET
Basophils Absolute: 0.1 10*3/uL (ref 0.0–0.2)
Basos: 1 %
EOS (ABSOLUTE): 0.1 10*3/uL (ref 0.0–0.4)
Eos: 1 %
Hematocrit: 43.2 % (ref 34.0–46.6)
Hemoglobin: 14.7 g/dL (ref 11.1–15.9)
Immature Grans (Abs): 0.1 10*3/uL (ref 0.0–0.1)
Immature Granulocytes: 1 %
Lymphocytes Absolute: 1.6 10*3/uL (ref 0.7–3.1)
Lymphs: 15 %
MCH: 31.5 pg (ref 26.6–33.0)
MCHC: 34 g/dL (ref 31.5–35.7)
MCV: 93 fL (ref 79–97)
Monocytes Absolute: 0.9 10*3/uL (ref 0.1–0.9)
Monocytes: 8 %
Neutrophils Absolute: 8.2 10*3/uL — ABNORMAL HIGH (ref 1.4–7.0)
Neutrophils: 74 %
Platelets: 335 10*3/uL (ref 150–450)
RBC: 4.66 x10E6/uL (ref 3.77–5.28)
RDW: 11.8 % (ref 11.7–15.4)
WBC: 10.9 10*3/uL — ABNORMAL HIGH (ref 3.4–10.8)

## 2020-08-07 LAB — VITAMIN D 25 HYDROXY (VIT D DEFICIENCY, FRACTURES): Vit D, 25-Hydroxy: 23.3 ng/mL — ABNORMAL LOW (ref 30.0–100.0)

## 2020-08-07 LAB — LYME AB/WESTERN BLOT REFLEX
LYME DISEASE AB, QUANT, IGM: <0.8 index (ref 0.00–0.79)
Lyme IgG/IgM Ab: <0.91 {ISR} (ref 0.00–0.90)

## 2020-08-07 LAB — TSH: TSH: 0.999 u[IU]/mL (ref 0.450–4.500)

## 2020-08-07 LAB — B12 AND FOLATE PANEL
Folate: >20 ng/mL (ref >3.0)
Vitamin B-12: 337 pg/mL (ref 232–1245)

## 2020-08-07 LAB — LIPID PANEL
Chol/HDL Ratio: 3.6 ratio (ref 0.0–4.4)
Cholesterol, Total: 201 mg/dL — ABNORMAL HIGH (ref 100–199)
HDL: 56 mg/dL (ref >39)
LDL Chol Calc (NIH): 111 mg/dL — ABNORMAL HIGH (ref 0–99)
Triglycerides: 196 mg/dL — ABNORMAL HIGH (ref 0–149)
VLDL Cholesterol Cal: 34 mg/dL (ref 5–40)

## 2020-08-08 NOTE — Progress Notes (Signed)
Lyme's negative

## 2020-08-13 ENCOUNTER — Telehealth: Payer: Self-pay | Admitting: *Deleted

## 2020-08-13 NOTE — Telephone Encounter (Signed)
Attempted to contact and schedule lung screening scan. However there is no voicemail or answer.

## 2020-08-19 ENCOUNTER — Telehealth: Payer: Self-pay | Admitting: Adult Health

## 2020-08-19 NOTE — Telephone Encounter (Signed)
Copied from CRM [id number]. Topic: Medicare AWV >> Aug 19, 2020  3:17 PM Claudette Laws R wrote: Reason for CRM:   Left message for patient to call back and schedule Medicare Annual Wellness Visit (AWV) in office.   If not able to come in the office, please offer to do virtually or by telephone.   No hx of AWV - AWV-I eligible as of 04/28/2019  Please schedule at anytime with [facility] Health Advisor.   40 minute appointment  Any questions, please contact me at [phone]

## 2020-08-20 ENCOUNTER — Encounter: Payer: Self-pay | Admitting: *Deleted

## 2020-08-20 ENCOUNTER — Telehealth: Payer: Self-pay

## 2020-08-20 ENCOUNTER — Telehealth: Payer: Self-pay | Admitting: *Deleted

## 2020-08-20 DIAGNOSIS — Z122 Encounter for screening for malignant neoplasm of respiratory organs: Secondary | ICD-10-CM

## 2020-08-20 DIAGNOSIS — F17200 Nicotine dependence, unspecified, uncomplicated: Secondary | ICD-10-CM

## 2020-08-20 DIAGNOSIS — Z87891 Personal history of nicotine dependence: Secondary | ICD-10-CM

## 2020-08-20 NOTE — Telephone Encounter (Signed)
Copied from CRM [id number]. Topic: Quick Communication - See Telephone Encounter >> Aug 20, 2020  8:35 AM Aretta Nip wrote: CRM for notification. See Telephone encounter for: 08/20/20. Copied CRM.Marland KitchenMarland KitchenCopied from CRM [id number]. Topic: Medicare AWV >> Aug 19, 2020  3:17 PM Claudette Laws R wrote: Reason for CRM:   Left message for patient to call back and schedule Medicare Annual Wellness Visit (AWV) in office.   If not able to come in the office, please offer to do virtually or by telephone.   No hx of AWV - AWV-I eligible as of 04/28/2019  Please schedule at anytime with [facility] Health Advisor.   40 minute appointment  Any questions, please contact me at [phone]  Pt is calling back to sch and wants to sch with her appt on 3/9 if possible call back [phone] if possible call today

## 2020-08-20 NOTE — Telephone Encounter (Signed)
Received referral for initial lung cancer screening scan. Contacted patient and obtained smoking history,(current, 36.75 pack year) as well as answering questions related to screening process. Patient denies signs of lung cancer such as weight loss or hemoptysis. Patient denies comorbidity that would prevent curative treatment if lung cancer were found. Patient is scheduled for shared decision making visit and CT scan on 09/08/20 at 130pm.

## 2020-08-24 ENCOUNTER — Other Ambulatory Visit: Payer: Self-pay

## 2020-08-24 ENCOUNTER — Ambulatory Visit: Payer: Medicare HMO | Admitting: Internal Medicine

## 2020-08-24 ENCOUNTER — Encounter: Payer: Self-pay | Admitting: Internal Medicine

## 2020-08-24 VITALS — BP 124/60 | HR 85 | Ht 64.0 in | Wt 106.6 lb

## 2020-08-24 DIAGNOSIS — R0989 Other specified symptoms and signs involving the circulatory and respiratory systems: Secondary | ICD-10-CM

## 2020-08-24 DIAGNOSIS — F17200 Nicotine dependence, unspecified, uncomplicated: Secondary | ICD-10-CM | POA: Diagnosis not present

## 2020-08-24 DIAGNOSIS — Z8249 Family history of ischemic heart disease and other diseases of the circulatory system: Secondary | ICD-10-CM

## 2020-08-24 DIAGNOSIS — R69 Illness, unspecified: Secondary | ICD-10-CM | POA: Diagnosis not present

## 2020-08-24 NOTE — Patient Instructions (Signed)
Medication Instructions:  Your physician recommends that you continue on your current medications as directed. Please refer to the Current Medication list given to you today.  *If you need a refill on your cardiac medications before your next appointment, please call your pharmacy*   Testing/Procedures: Your physician has requested that you have a carotid duplex. This test is an ultrasound of the carotid arteries in your neck. It looks at blood flow through these arteries that supply the brain with blood. Allow one hour for this exam. There are no restrictions or special instructions.  -- [address] (Dr. Hilty's office)   Follow-Up: At [facility], you and your health needs are our priority.  As part of our continuing mission to provide you with exceptional heart care, we have created designated Provider Care Teams.  These Care Teams include your primary Cardiologist (physician) and Advanced Practice Providers (APPs -  Physician Assistants and Nurse Practitioners) who all work together to provide you with the care you need, when you need it.  We recommend signing up for the patient portal called "MyChart".  Sign up information is provided on this After Visit Summary.  MyChart is used to connect with patients for Virtual Visits (Telemedicine).  Patients are able to view lab/test results, encounter notes, upcoming appointments, etc.  Non-urgent messages can be sent to your provider as well.   To learn more about what you can do with MyChart, go to [email or web address].    Your next appointment:   4 week(s) - after testing  The format for your next appointment:   In Person  Provider:   You may see Dr. Rennis Golden or one of the following Advanced Practice Providers on your designated Care Team:    Azalee Course, PA-C  Micah Flesher, [place] or   Judy Pimple, [place]    Other Instructions

## 2020-08-24 NOTE — Progress Notes (Signed)
OFFICE CONSULT NOTE  Chief Complaint:  Carotid bruit  Primary Care Physician: Berniece Pap, FNP  HPI:  Sierra Brown is a 70 y.o. female who is being seen today for the evaluation of carotid bruit at the request of Flinchum, Eula Fried, F*. This is a pleasant 70 year old female whose husband is a patient of mine.  She has a history of COPD, prior pneumonia and tobacco abuse and recently had an exam by her PCP which noted a carotid bruit.  She was then referred to cardiology for further evaluation.  Heart disease is in her family including mother who had hypertension, PAD and A. fib and her brother who had a stroke.  She also reports a recent episode of increased blood pressure and palpitations.  She was started on HCTZ which definitely improved her symptoms and now her palpitations are almost gone.  Blood pressure was excellent today at 124/60.  Her recent lipid profile does show some increased cholesterol with total 201, triglycerides 196, HDL 56 and LDL 111.  PMHx:  Past Medical History:  Diagnosis Date  . Allergy    allerlgic rhinitis  . Cataract   . COPD (chronic obstructive pulmonary disease) (HCC)   . Pneumonia   . Tobacco use disorder     Past Surgical History:  Procedure Laterality Date  . CESAREAN SECTION    . TONSILLECTOMY      FAMHx:  Family History  Problem Relation Age of Onset  . Ulcerative colitis Mother        colostomy  . Hypertension Mother   . Heart Problems Mother     SOCHx:   reports that she has been smoking cigarettes. She has a 36.75 pack-year smoking history. She has never used smokeless tobacco. No history on file for alcohol use and drug use.  ALLERGIES:  Allergies  Allergen Reactions  . Codeine     REACTION: nausea and vomiting Other reaction(s): Other (See Comments) REACTION: nausea and vomiting  . Levofloxacin     REACTION: nausea Other reaction(s): Other (See Comments) REACTION: nausea  . Sulfonamide Derivatives      REACTION: hives  . Penicillins Rash    REACTION: hives Other reaction(s): Other (See Comments) REACTION: hives   . Sulfa Antibiotics Rash    REACTION: hives    ROS: Pertinent items noted in HPI and remainder of comprehensive ROS otherwise negative.  HOME MEDS: Current Outpatient Medications on File Prior to Visit  Medication Sig Dispense Refill  . aspirin EC 81 MG tablet Take 1 tablet (81 mg total) by mouth daily. Swallow whole. 90 tablet 2  . Cyanocobalamin (B-12) 1000 MCG SUBL Place 1 tablet under the tongue daily. 90 tablet 1  . fluticasone (FLONASE) 50 MCG/ACT nasal spray Place into the nose.    . hydrochlorothiazide (HYDRODIURIL) 12.5 MG tablet Take 1 tablet (12.5 mg total) by mouth daily. 90 tablet 0  . Loratadine 10 MG CAPS Take by mouth.    Marland Kitchen omeprazole (PRILOSEC) 20 MG capsule Take 1 capsule (20 mg total) by mouth daily. 90 capsule 0  . Vitamin D, Ergocalciferol, (DRISDOL) 1.25 MG (50000 UNIT) CAPS capsule Take 1 capsule (50,000 Units total) by mouth every 7 (seven) days. (taking one tablet per week) walk in lab in office 1-2 weeks after completing prescription. 12 capsule 0   No current facility-administered medications on file prior to visit.    LABS/IMAGING: No results found for this or any previous visit (from the past 48 hour(s)). No results found.  LIPID PANEL:    Component Value Date/Time   CHOL 201 (H) 08/05/2020 1116   TRIG 196 (H) 08/05/2020 1116   HDL 56 08/05/2020 1116   CHOLHDL 3.6 08/05/2020 1116   LDLCALC 111 (H) 08/05/2020 1116    WEIGHTS: Wt Readings from Last 3 Encounters:  08/24/20 106 lb 9.6 oz (48.4 kg)  08/05/20 101 lb 6.4 oz (46 kg)  08/18/11 104 lb (47.2 kg)    VITALS: BP 124/60   Pulse 85   Ht 5\' 4"  (1.626 m)   Wt 106 lb 9.6 oz (48.4 kg)   SpO2 94%   BMI 18.30 kg/m   EXAM: General appearance: alert and no distress Neck: no carotid bruit, no JVD and thyroid not enlarged, symmetric, no tenderness/mass/nodules Lungs: clear to  auscultation bilaterally Heart: regular rate and rhythm, S1, S2 normal, no murmur, click, rub or gallop Abdomen: soft, non-tender; bowel sounds normal; no masses,  no organomegaly Extremities: extremities normal, atraumatic, no cyanosis or edema Pulses: 2+ and symmetric Skin: Skin color, texture, turgor normal. No rashes or lesions Neurologic: Grossly normal Psych: Pleasant  EKG: Sinus rhythm at 89 (08/05/2020)- personally reviewed  ASSESSMENT: 1. Possible carotid bruit 2. Hypertension 3. Family history of coronary disease 4. COPD with tobacco abuse  PLAN: 1.   Ms. Fogelman was noted to have a carotid bruit however I could not auscultated on exam today.  Blood pressure is better controlled and this may have been a flow murmur.  There is a family history of coronary disease and PAD.  I like to get carotid Dopplers either way.  In addition she has COPD and tobacco abuse which is ongoing.  Plan follow-up with me afterwards.  She is also scheduled to get a screening CT due to her smoking history.  If there is evidence of coronary calcium on the scan then would consider more aggressive therapy.  Thanks again for the kind referral.  10/03/2020, MD, [facility]  [place]  [facility]  Medical Director of the [facility]  Cardiovascular Risk Reduction Clinic Diplomate of the American Board of Clinical Lipidology Attending Cardiologist  Direct Dial: [phone]  Fax: [phone]  Website:  [email or web address] 08/24/2020, 9:40 PM

## 2020-08-25 ENCOUNTER — Telehealth: Payer: Self-pay | Admitting: *Deleted

## 2020-08-25 NOTE — Telephone Encounter (Signed)
   [facility] Pre-operative Risk Assessment    HEARTCARE STAFF: - Please ensure there is not already an duplicate clearance open for this procedure. - Under Visit Info/Reason for Call, type in Other and utilize the format Clearance MM/DD/YY or Clearance TBD. Do not use dashes or single digits. - If request is for dental extraction, please clarify the # of teeth to be extracted.  Request for surgical clearance:  1. What type of surgery is being performed? 1 dental extraction   2. When is this surgery scheduled? TBD   3. What type of clearance is required (medical clearance vs. Pharmacy clearance to hold med vs. Both)? medical  4. Are there any medications that need to be held prior to surgery and how long?none   5. Practice name and name of physician performing surgery? [facility]  6. What is the office phone number? [phone]   7.   What is the office fax number? [phone]  8.   Anesthesia type (None, local, MAC, general) ? general   Fredia Beets 08/25/2020, 4:25 PM  _________________________________________________________________   (provider comments below)

## 2020-08-26 NOTE — Telephone Encounter (Signed)
   Primary Cardiologist: Dr. Rennis Golden, MD   Chart reviewed as part of pre-operative protocol coverage.   Simple dental extractions are considered low risk procedures per guidelines and generally do not require any specific cardiac clearance. It is also generally accepted that for simple extractions and dental cleanings, there is no need to interrupt blood thinner therapy.   SBE prophylaxis is not required for the patient from a cardiac standpoint.  I will route this recommendation to the requesting party via Epic fax function and remove from pre-op pool.  Please call with questions.  Georgie Chard, NP 08/26/2020, 8:20 AM

## 2020-09-01 ENCOUNTER — Ambulatory Visit
Admission: RE | Admit: 2020-09-01 | Discharge: 2020-09-01 | Disposition: A | Discharge status: Home / Self Care | Payer: Medicare HMO | Source: Ambulatory Visit | Attending: Adult Health | Admitting: Adult Health

## 2020-09-01 ENCOUNTER — Other Ambulatory Visit: Payer: Self-pay

## 2020-09-01 DIAGNOSIS — Z1231 Encounter for screening mammogram for malignant neoplasm of breast: Secondary | ICD-10-CM | POA: Diagnosis not present

## 2020-09-02 ENCOUNTER — Ambulatory Visit: Payer: Self-pay | Admitting: Adult Health

## 2020-09-02 NOTE — Progress Notes (Deleted)
  {  This patient's chart is due for periodic physician review. Please check 'Cosign Required' and forward to your supervising physician.:1}  Established patient visit   Patient: Sierra Brown   DOB: [birth year]   70 y.o. Female  MRN: [id number] Visit Date: 09/03/2020  Today's healthcare provider: Jairo Ben, FNP   No chief complaint on file.  Subjective    HPI  Follow up for bruit of left coronary artery   The patient was last seen for this 1 months ago. Changes made at last visit include labs were ordered, patient was referred to cardiology, patient was started on Aspirin 81mg  .  She reports {excellent/good/fair/poor:19665} compliance with treatment. She feels that condition is {improved/worse/unchanged:3041574}. She {is/is not:21021397} having side effects. ***  -----------------------------------------------------------------------------------------    {Show patient history (optional):23778::" "}   Medications: Outpatient Medications Prior to Visit  Medication Sig  . aspirin EC 81 MG tablet Take 1 tablet (81 mg total) by mouth daily. Swallow whole.  . Cyanocobalamin (B-12) 1000 MCG SUBL Place 1 tablet under the tongue daily.  . fluticasone (FLONASE) 50 MCG/ACT nasal spray Place into the nose.  . hydrochlorothiazide (HYDRODIURIL) 12.5 MG tablet Take 1 tablet (12.5 mg total) by mouth daily.  . Loratadine 10 MG CAPS Take by mouth.  omeprazole (PRILOSEC) 20 MG capsule Take 1 capsule (20 mg total) by mouth daily.  . Vitamin D, Ergocalciferol, (DRISDOL) 1.25 MG (50000 UNIT) CAPS capsule Take 1 capsule (50,000 Units total) by mouth every 7 (seven) days. (taking one tablet per week) walk in lab in office 1-2 weeks after completing prescription.   No facility-administered medications prior to visit.    Review of Systems  {Labs  Heme  Chem  Endocrine  Serology  Results Review (optional):23779::" "}   Objective    There were no vitals taken for this  visit. {Show previous vital signs (optional):23777::" "}   Physical Exam  ***  No results found for any visits on 09/03/20.  Assessment & Plan     ***  No follow-ups on file.      {provider attestation***:1}   11/03/20, FNP  [facility] [phone] (phone) [phone] (fax)  [facility]

## 2020-09-03 ENCOUNTER — Ambulatory Visit: Payer: Self-pay | Admitting: Adult Health

## 2020-09-07 ENCOUNTER — Other Ambulatory Visit: Payer: Self-pay

## 2020-09-07 ENCOUNTER — Ambulatory Visit (HOSPITAL_COMMUNITY)
Admission: RE | Admit: 2020-09-07 | Discharge: 2020-09-07 | Disposition: A | Discharge status: Home / Self Care | Payer: Medicare HMO | Source: Ambulatory Visit | Attending: Internal Medicine | Admitting: Internal Medicine

## 2020-09-07 DIAGNOSIS — F17200 Nicotine dependence, unspecified, uncomplicated: Secondary | ICD-10-CM | POA: Diagnosis not present

## 2020-09-07 DIAGNOSIS — R0989 Other specified symptoms and signs involving the circulatory and respiratory systems: Secondary | ICD-10-CM | POA: Insufficient documentation

## 2020-09-07 DIAGNOSIS — Z8249 Family history of ischemic heart disease and other diseases of the circulatory system: Secondary | ICD-10-CM | POA: Diagnosis not present

## 2020-09-07 DIAGNOSIS — R69 Illness, unspecified: Secondary | ICD-10-CM | POA: Diagnosis not present

## 2020-09-08 ENCOUNTER — Inpatient Hospital Stay: Payer: Medicare HMO | Attending: Nurse Practitioner | Admitting: Nurse Practitioner

## 2020-09-08 ENCOUNTER — Ambulatory Visit
Admission: RE | Admit: 2020-09-08 | Discharge: 2020-09-08 | Disposition: A | Discharge status: Home / Self Care | Payer: Medicare HMO | Source: Ambulatory Visit | Attending: Nurse Practitioner | Admitting: Nurse Practitioner

## 2020-09-08 ENCOUNTER — Other Ambulatory Visit: Payer: Self-pay

## 2020-09-08 DIAGNOSIS — Z87891 Personal history of nicotine dependence: Secondary | ICD-10-CM

## 2020-09-08 DIAGNOSIS — Z122 Encounter for screening for malignant neoplasm of respiratory organs: Secondary | ICD-10-CM | POA: Insufficient documentation

## 2020-09-08 DIAGNOSIS — F17200 Nicotine dependence, unspecified, uncomplicated: Secondary | ICD-10-CM | POA: Insufficient documentation

## 2020-09-08 DIAGNOSIS — F17210 Nicotine dependence, cigarettes, uncomplicated: Secondary | ICD-10-CM

## 2020-09-08 DIAGNOSIS — R69 Illness, unspecified: Secondary | ICD-10-CM | POA: Diagnosis not present

## 2020-09-08 IMAGING — CT CT CHEST LUNG CANCER SCREENING LOW DOSE W/O CM
2 of 5 series · 14 of 40 positions shown, 17 images · non-contrast
Comparison: Chest radiograph [DATE].

CLINICAL DATA: Current smoker, 37 pack-year history.

EXAM:
CT CHEST WITHOUT CONTRAST LOW-DOSE FOR LUNG CANCER SCREENING
TECHNIQUE: Multidetector CT imaging of the chest was performed following the
standard protocol without IV contrast.

[Series 3: lung 1.00 · axial · 0.49mm/px · z∈[-1262,-919]mm · 11 of 381 slices shown, 14 images]
[im 19/381  mediastinal]
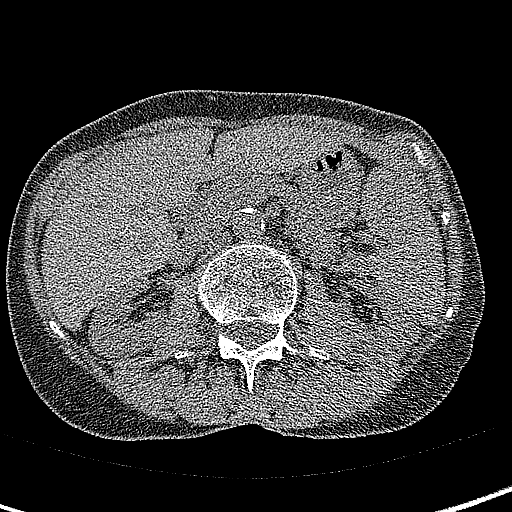
[im 19/381  lung]
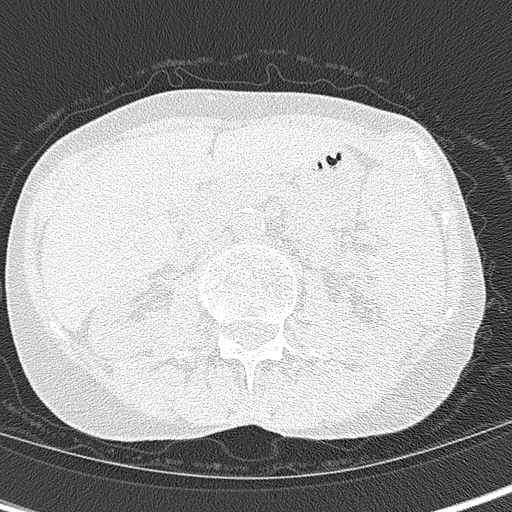
[im 55/381  lung]
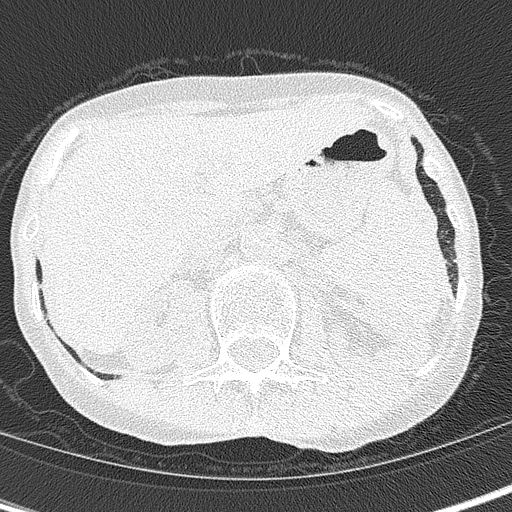
[im 91/381  lung]
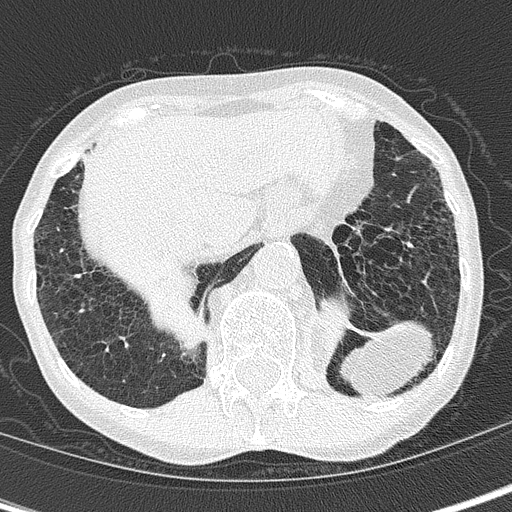
[im 127/381  lung]
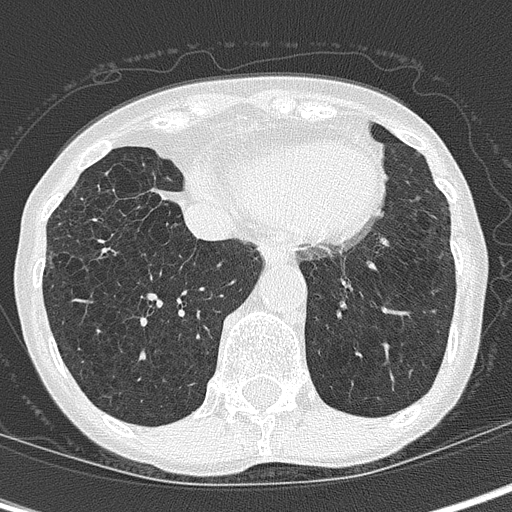
[im 163/381  mediastinal]
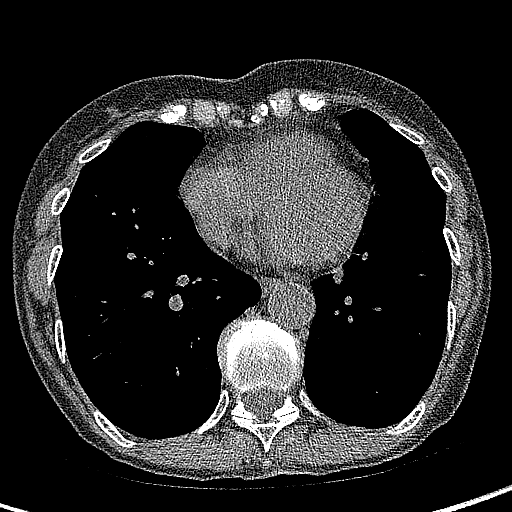
[im 163/381  lung]
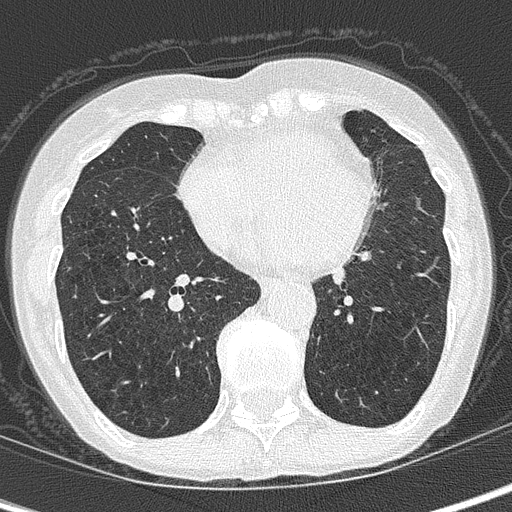
[im 200/381  lung]
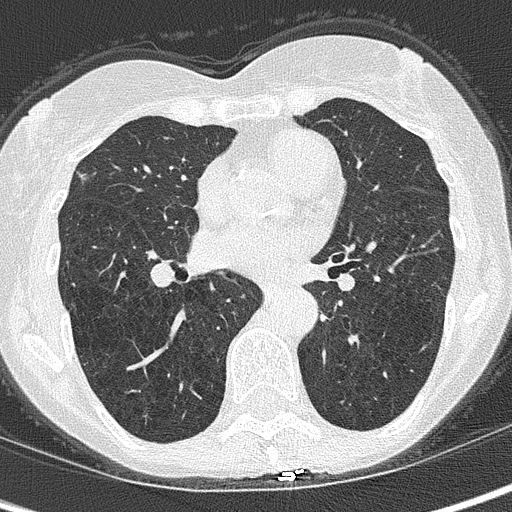
[im 218/381  lung]
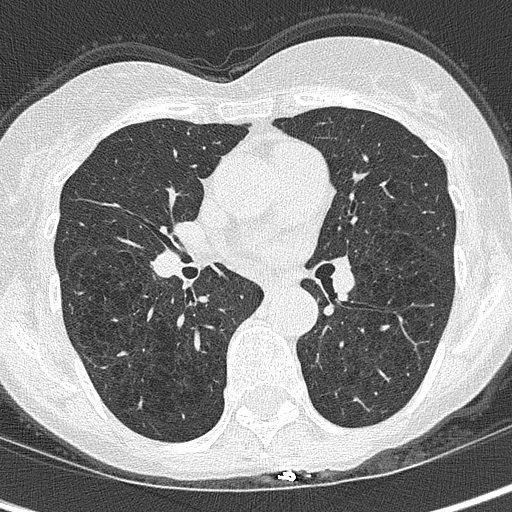
[im 254/381  lung]
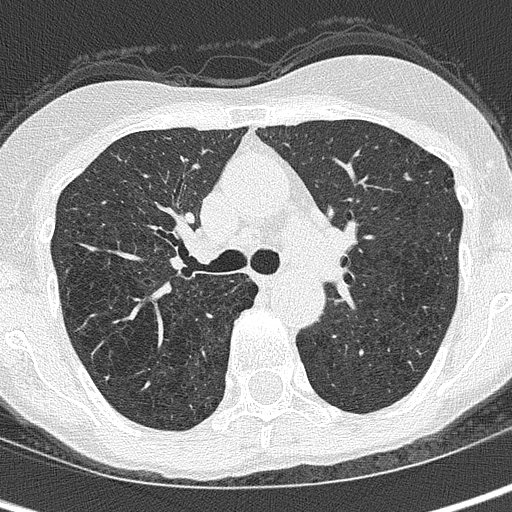
[im 290/381  mediastinal]
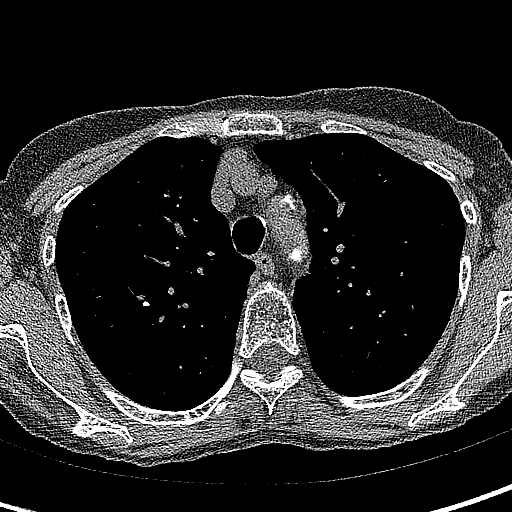
[im 290/381  lung]
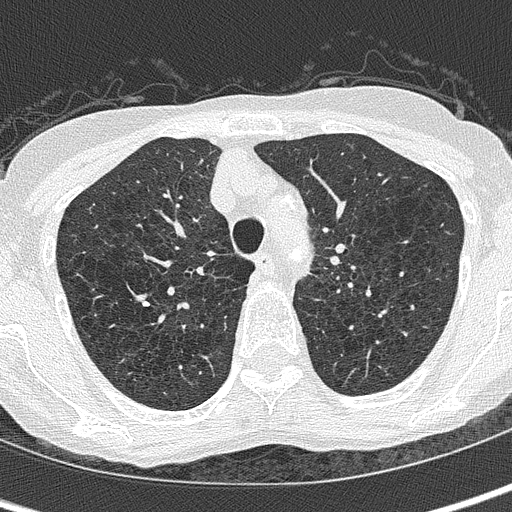
[im 326/381  lung]
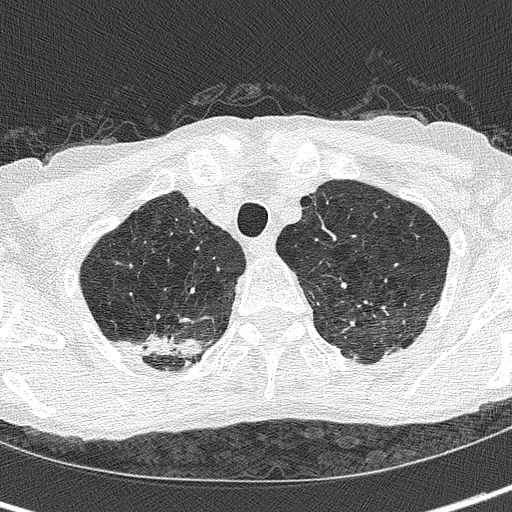
[im 362/381  lung]
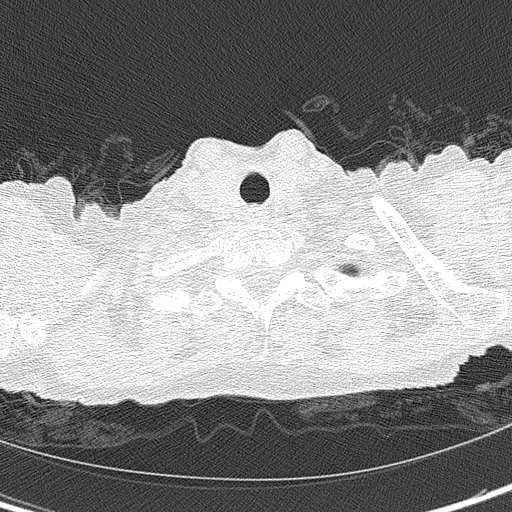

[Series 5: coronals lung 1.00 cor · coronal · 0.49mm/px · 3 of 222 slices shown]
[im 45/222  lung]
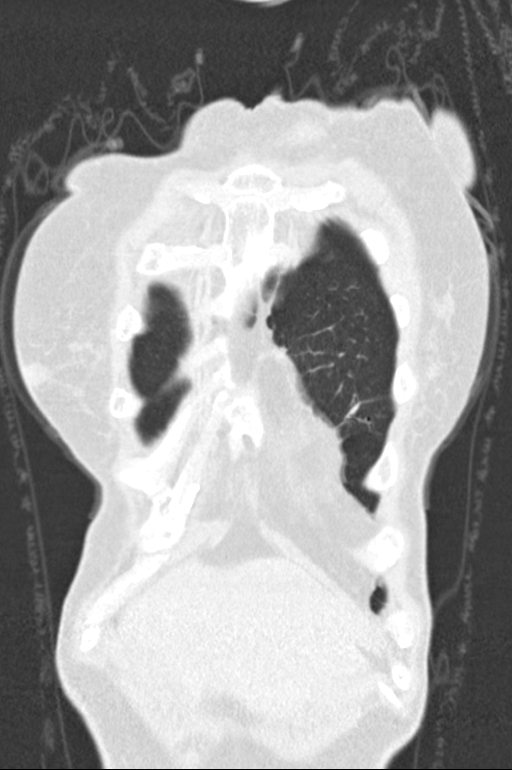
[im 89/222  lung]
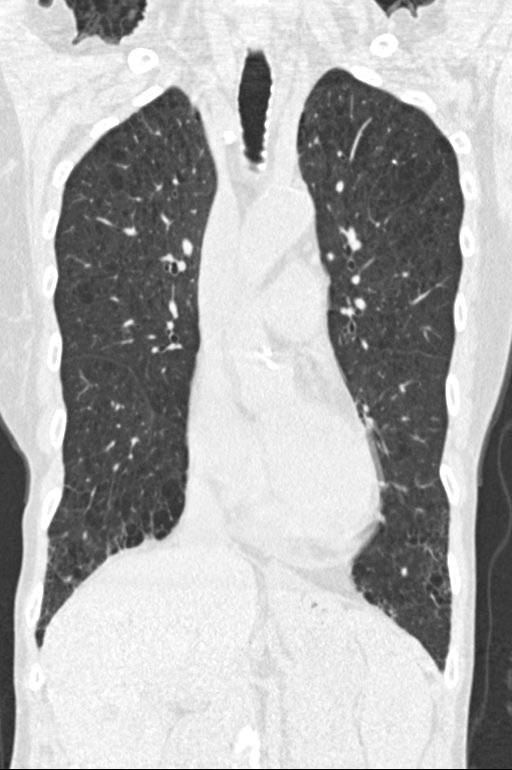
[im 133/222  lung]
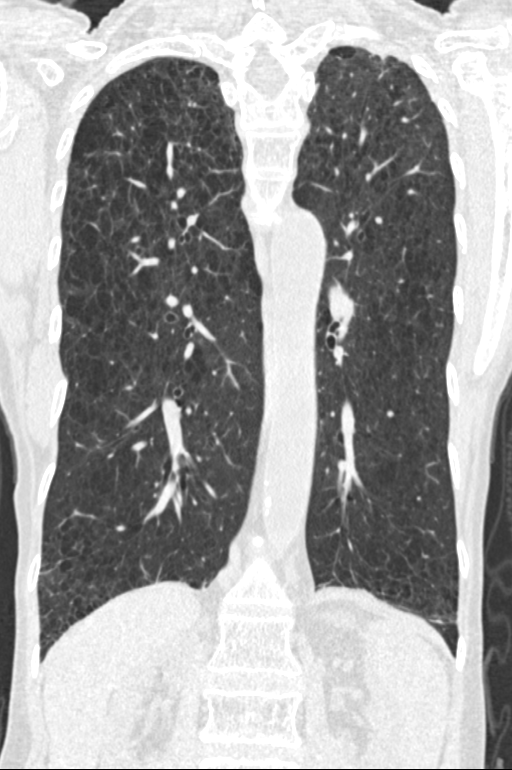

[14 of 40 positions shown; findings below may reference images not displayed]

FINDINGS: Cardiovascular: Atherosclerotic calcification of the aorta and
coronary arteries. Heart size normal. No pericardial effusion.

Mediastinum/Nodes: No pathologically enlarged mediastinal or
axillary lymph nodes. Hilar regions are difficult to definitively
evaluate without IV contrast but appear grossly unremarkable.
Esophagus is grossly unremarkable.

Lungs/Pleura: Biapical pleuroparenchymal scarring, nodular on the
right, similar to chest radiograph [DATE]. Centrilobular and
paraseptal emphysema. Calcified granulomas. 4.5 mm subpleural
posterior left upper lobe nodule. No suspicious pulmonary nodules.
No pleural fluid. Airway is unremarkable.

Upper Abdomen: Low-attenuation lesions in the liver measure up to 11
mm and are likely cysts. Visualized portions of the liver and
adrenal glands are unremarkable. Stones are seen in the kidneys
bilaterally. Visualized portions of the spleen, pancreas and stomach
are grossly unremarkable.

Musculoskeletal: No worrisome lytic or sclerotic lesions.
Osteopenia.
IMPRESSION: 1. Lung-RADS 2, benign appearance or behavior. Continue annual
screening with low-dose chest CT without contrast in 12 months.
2. Bilateral renal stones.
3. Aortic atherosclerosis ([ZP]-[ZP]). Coronary artery
calcification.
4.  Emphysema ([ZP]-[ZP]).

## 2020-09-08 NOTE — Progress Notes (Signed)
Virtual Visit via Video Enabled Telemedicine Note   I connected with Sierra Brown on 09/08/20 at 1:30 PM EST by video enabled telemedicine visit and verified that I am speaking with the correct person using two identifiers.   I discussed the limitations, risks, security and privacy concerns of performing an evaluation and management service by telemedicine and the availability of in-person appointments. I also discussed with the patient that there may be a patient responsible charge related to this service. The patient expressed understanding and agreed to proceed.   Other persons participating in the visit and their role in the encounter: Burgess Estelle, RN- checking in patient & navigation  Patient's location: [place]  Provider's location: Clinic  Chief Complaint: Low Dose CT Screening  Patient agreed to evaluation by telemedicine to discuss shared decision making for consideration of low dose CT lung cancer screening.    In accordance with CMS guidelines, patient has met eligibility criteria including age, absence of signs or symptoms of lung cancer.  Social History   Tobacco Use  . Smoking status: Current Every Day Smoker    Packs/day: 0.75    Years: 49.00    Pack years: 36.75    Types: Cigarettes  . Smokeless tobacco: Never Used  Substance Use Topics  . Alcohol use: Not on file     A shared decision-making session was conducted prior to the performance of CT scan. This includes one or more decision aids, includes benefits and harms of screening, follow-up diagnostic testing, over-diagnosis, false positive rate, and total radiation exposure.   Counseling on the importance of adherence to annual lung cancer LDCT screening, impact of co-morbidities, and ability or willingness to undergo diagnosis and treatment is imperative for compliance of the program.   Counseling on the importance of continued smoking cessation for former smokers; the importance of smoking cessation  for current smokers, and information about tobacco cessation interventions have been given to patient including [place] and [address] [employer] [place] programs.   Written order for lung cancer screening with LDCT has been given to the patient and any and all questions have been answered to the best of my abilities.    Yearly follow up will be coordinated by Burgess Estelle, Thoracic Navigator.  I discussed the assessment and treatment plan with the patient. The patient was provided an opportunity to ask questions and all were answered. The patient agreed with the plan and demonstrated an understanding of the instructions.   The patient was advised to call back or seek an in-person evaluation if the symptoms worsen or if the condition fails to improve as anticipated.   I provided 15 minutes of face-to-face video visit time dedicated to the care of this patient on the date of this encounter to include pre-visit review of smoking history, face-to-face time with the patient, and post visit ordering of testing/documentation.   Beckey Rutter, DNP, AGNP-C [place] [facility] [phone] (clinic)

## 2020-09-09 ENCOUNTER — Encounter: Payer: Self-pay | Admitting: Adult Health

## 2020-09-09 ENCOUNTER — Ambulatory Visit (INDEPENDENT_AMBULATORY_CARE_PROVIDER_SITE_OTHER): Payer: Medicare HMO | Admitting: Adult Health

## 2020-09-09 VITALS — BP 139/49 | HR 76 | Resp 15

## 2020-09-09 DIAGNOSIS — Z8679 Personal history of other diseases of the circulatory system: Secondary | ICD-10-CM

## 2020-09-09 DIAGNOSIS — D72829 Elevated white blood cell count, unspecified: Secondary | ICD-10-CM | POA: Diagnosis not present

## 2020-09-09 DIAGNOSIS — Z720 Tobacco use: Secondary | ICD-10-CM

## 2020-09-09 NOTE — Patient Instructions (Addendum)
Mediterranean Diet A Mediterranean diet refers to food and lifestyle choices that are based on the traditions of countries located on the [place]. This way of eating has been shown to help prevent certain conditions and improve outcomes for people who have chronic diseases, like kidney disease and heart disease. What are tips for following this plan? Lifestyle  Cook and eat meals together with your family, when possible.  Drink enough fluid to keep your urine clear or pale yellow.  Be physically active every day. This includes: ? Aerobic exercise like running or swimming. ? Leisure activities like gardening, walking, or housework.  Get 7-8 hours of sleep each night.  If recommended by your health care provider, drink red wine in moderation. This means 1 glass a day for nonpregnant women and 2 glasses a day for men. A glass of wine equals 5 oz (150 mL). Reading food labels  Check the serving size of packaged foods. For foods such as rice and pasta, the serving size refers to the amount of cooked product, not dry.  Check the total fat in packaged foods. Avoid foods that have saturated fat or trans fats.  Check the ingredients list for added sugars, such as corn syrup.   Shopping  At the grocery store, buy most of your food from the areas near the walls of the store. This includes: ? Fresh fruits and vegetables (produce). ? Grains, beans, nuts, and seeds. Some of these may be available in unpackaged forms or large amounts (in bulk). ? Fresh seafood. ? Poultry and eggs. ? Low-fat dairy products.  Buy whole ingredients instead of prepackaged foods.  Buy fresh fruits and vegetables in-season from local farmers markets.  Buy frozen fruits and vegetables in resealable bags.  If you do not have access to quality fresh seafood, buy precooked frozen shrimp or canned fish, such as tuna, salmon, or sardines.  Buy small amounts of raw or cooked vegetables, salads, or olives from  the deli or salad bar at your store.  Stock your pantry so you always have certain foods on hand, such as olive oil, canned tuna, canned tomatoes, rice, pasta, and beans. Cooking  Cook foods with extra-virgin olive oil instead of using butter or other vegetable oils.  Have meat as a side dish, and have vegetables or grains as your main dish. This means having meat in small portions or adding small amounts of meat to foods like pasta or stew.  Use beans or vegetables instead of meat in common dishes like chili or lasagna.  Experiment with different cooking methods. Try roasting or broiling vegetables instead of steaming or sauteing them.  Add frozen vegetables to soups, stews, pasta, or rice.  Add nuts or seeds for added healthy fat at each meal. You can add these to yogurt, salads, or vegetable dishes.  Marinate fish or vegetables using olive oil, lemon juice, garlic, and fresh herbs. Meal planning  Plan to eat 1 vegetarian meal one day each week. Try to work up to 2 vegetarian meals, if possible.  Eat seafood 2 or more times a week.  Have healthy snacks readily available, such as: ? Vegetable sticks with hummus. ? Greek yogurt. ? Fruit and nut trail mix.  Eat balanced meals throughout the week. This includes: ? Fruit: 2-3 servings a day ? Vegetables: 4-5 servings a day ? Low-fat dairy: 2 servings a day ? Fish, poultry, or lean meat: 1 serving a day ? Beans and legumes: 2 or more servings a week ?   Nuts and seeds: 1-2 servings a day ? Whole grains: 6-8 servings a day ? Extra-virgin olive oil: 3-4 servings a day  Limit red meat and sweets to only a few servings a month   What are my food choices?  Mediterranean diet ? Recommended  Grains: Whole-grain pasta. Brown rice. Bulgar wheat. Polenta. Couscous. Whole-wheat bread. Orpah Cobb.  Vegetables: Artichokes. Beets. Broccoli. Cabbage. Carrots. Eggplant. Green beans. Chard. Kale. Spinach. Onions. Leeks. Peas. Squash.  Tomatoes. Peppers. Radishes.  Fruits: Apples. Apricots. Avocado. Berries. Bananas. Cherries. Dates. Figs. Grapes. Lemons. Melon. Oranges. Peaches. Plums. Pomegranate.  Meats and other protein foods: Beans. Almonds. Sunflower seeds. Pine nuts. Peanuts. Cod. Salmon. Scallops. Shrimp. Tuna. Tilapia. Clams. Oysters. Eggs.  Dairy: Low-fat milk. Cheese. Greek yogurt.  Beverages: Water. Red wine. Herbal tea.  Fats and oils: Extra virgin olive oil. Avocado oil. Grape seed oil.  Sweets and desserts: [place] yogurt with honey. Baked apples. Poached pears. Trail mix.  Seasoning and other foods: Basil. Cilantro. Coriander. Cumin. Mint. Parsley. Sage. Rosemary. Tarragon. Garlic. Oregano. Thyme. Pepper. Balsalmic vinegar. Tahini. Hummus. Tomato sauce. Olives. Mushrooms. ? Limit these  Grains: Prepackaged pasta or rice dishes. Prepackaged cereal with added sugar.  Vegetables: Deep fried potatoes (french fries).  Fruits: Fruit canned in syrup.  Meats and other protein foods: Beef. Pork. Lamb. Poultry with skin. Hot dogs. Tomasa Blase.  Dairy: Ice cream. Sour cream. Whole milk.  Beverages: Juice. Sugar-sweetened soft drinks. Beer. Liquor and spirits.  Fats and oils: Butter. Canola oil. Vegetable oil. Beef fat (tallow). Lard.  Sweets and desserts: Cookies. Cakes. Pies. Candy.  Seasoning and other foods: Mayonnaise. Premade sauces and marinades. The items listed may not be a complete list. Talk with your dietitian about what dietary choices are right for you. Summary  The Mediterranean diet includes both food and lifestyle choices.  Eat a variety of fresh fruits and vegetables, beans, nuts, seeds, and whole grains.  Limit the amount of red meat and sweets that you eat.  Talk with your health care provider about whether it is safe for you to drink red wine in moderation. This means 1 glass a day for nonpregnant women and 2 glasses a day for men. A glass of wine equals 5 oz (150 mL). This information  is not intended to replace advice given to you by your health care provider. Make sure you discuss any questions you have with your health care provider. Document Revised: 02/11/2016 Document Reviewed: 02/04/2016 Elsevier Patient Education  2020 [employer] Fat and Cholesterol Restricted Eating Plan Getting too much fat and cholesterol in your diet may cause health problems. Choosing the right foods helps keep your fat and cholesterol at normal levels. This can keep you from getting certain diseases. Your doctor may recommend an eating plan that includes:  Total fat: ______% or less of total calories a day.  Saturated fat: ______% or less of total calories a day.  Cholesterol: less than _________mg a day.  Fiber: ______g a day. What are tips for following this plan? Meal planning  At meals, divide your plate into four equal parts: ? Fill one-half of your plate with vegetables and green salads. ? Fill one-fourth of your plate with whole grains. ? Fill one-fourth of your plate with low-fat (lean) protein foods.  Eat fish that is high in omega-3 fats at least two times a week. This includes mackerel, tuna, sardines, and salmon.  Eat foods that are high in fiber, such as whole grains, beans, apples, broccoli, carrots, peas, and  barley. General tips  Work with your doctor to lose weight if you need to.  Avoid: ? Foods with added sugar. ? Fried foods. ? Foods with partially hydrogenated oils.  Limit alcohol intake to no more than 1 drink a day for nonpregnant women and 2 drinks a day for men. One drink equals 12 oz of beer, 5 oz of wine, or 1 oz of hard liquor.   Reading food labels  Check food labels for: ? Trans fats. ? Partially hydrogenated oils. ? Saturated fat (g) in each serving. ? Cholesterol (mg) in each serving. ? Fiber (g) in each serving.  Choose foods with healthy fats, such as: ? Monounsaturated fats. ? Polyunsaturated fats. ? Omega-3 fats.  Choose grain  products that have whole grains. Look for the word "whole" as the first word in the ingredient list. Cooking  Cook foods using low-fat methods. These include baking, boiling, grilling, and broiling.  Eat more home-cooked foods. Eat at restaurants and buffets less often.  Avoid cooking using saturated fats, such as butter, cream, palm oil, palm kernel oil, and coconut oil. Recommended foods Fruits  All fresh, canned (in natural juice), or frozen fruits. Vegetables  Fresh or frozen vegetables (raw, steamed, roasted, or grilled). Green salads. Grains  Whole grains, such as whole wheat or whole grain breads, crackers, cereals, and pasta. Unsweetened oatmeal, bulgur, barley, quinoa, or brown rice. Corn or whole wheat flour tortillas. Meats and other protein foods  Ground beef (85% or leaner), grass-fed beef, or beef trimmed of fat. Skinless chicken or [place]. Ground chicken or [place]. Pork trimmed of fat. All fish and seafood. Egg whites. Dried beans, peas, or lentils. Unsalted nuts or seeds. Unsalted canned beans. Nut butters without added sugar or oil. Dairy  Low-fat or nonfat dairy products, such as skim or 1% milk, 2% or reduced-fat cheeses, low-fat and fat-free ricotta or cottage cheese, or plain low-fat and nonfat yogurt. Fats and oils  Tub margarine without trans fats. Light or reduced-fat mayonnaise and salad dressings. Avocado. Olive, canola, sesame, or safflower oils. The items listed above may not be a complete list of foods and beverages you can eat. Contact a dietitian for more information.   Foods to avoid Fruits  Canned fruit in heavy syrup. Fruit in cream or butter sauce. Fried fruit. Vegetables  Vegetables cooked in cheese, cream, or butter sauce. Fried vegetables. Grains  White bread. White pasta. White rice. Cornbread. Bagels, pastries, and croissants. Crackers and snack foods that contain trans fat and hydrogenated oils. Meats and other protein foods  Fatty cuts  of meat. Ribs, chicken wings, bacon, sausage, bologna, salami, chitterlings, fatback, hot dogs, bratwurst, and packaged lunch meats. Liver and organ meats. Whole eggs and egg yolks. Chicken and [place] with skin. Fried meat. Dairy  Whole or 2% milk, cream, half-and-half, and cream cheese. Whole milk cheeses. Whole-fat or sweetened yogurt. Full-fat cheeses. Nondairy creamers and whipped toppings. Processed cheese, cheese spreads, and cheese curds. Beverages  Alcohol. Sugar-sweetened drinks such as sodas, lemonade, and fruit drinks. Fats and oils  Butter, stick margarine, lard, shortening, ghee, or bacon fat. Coconut, palm kernel, and palm oils. Sweets and desserts  Corn syrup, sugars, honey, and molasses. Candy. Jam and jelly. Syrup. Sweetened cereals. Cookies, pies, cakes, donuts, muffins, and ice cream. The items listed above may not be a complete list of foods and beverages you should avoid. Contact a dietitian for more information. Summary  Choosing the right foods helps keep your fat and cholesterol at normal  levels. This can keep you from getting certain diseases.  At meals, fill one-half of your plate with vegetables and green salads.  Eat high-fiber foods, like whole grains, beans, apples, carrots, peas, and barley.  Limit added sugar, saturated fats, alcohol, and fried foods. This information is not intended to replace advice given to you by your health care provider. Make sure you discuss any questions you have with your health care provider. Document Revised: 10/16/2019 Document Reviewed: 10/16/2019 Elsevier Patient Education  2021 [employer] Fat and Cholesterol Restricted Eating Plan Getting too much fat and cholesterol in your diet may cause health problems. Choosing the right foods helps keep your fat and cholesterol at normal levels. This can keep you from getting certain diseases. Your doctor may recommend an eating plan that includes:  Total fat: ______% or less of  total calories a day.  Saturated fat: ______% or less of total calories a day.  Cholesterol: less than _________mg a day.  Fiber: ______g a day. What are tips for following this plan? Meal planning  At meals, divide your plate into four equal parts: ? Fill one-half of your plate with vegetables and green salads. ? Fill one-fourth of your plate with whole grains. ? Fill one-fourth of your plate with low-fat (lean) protein foods.  Eat fish that is high in omega-3 fats at least two times a week. This includes mackerel, tuna, sardines, and salmon.  Eat foods that are high in fiber, such as whole grains, beans, apples, broccoli, carrots, peas, and barley. General tips  Work with your doctor to lose weight if you need to.  Avoid: ? Foods with added sugar. ? Fried foods. ? Foods with partially hydrogenated oils.  Limit alcohol intake to no more than 1 drink a day for nonpregnant women and 2 drinks a day for men. One drink equals 12 oz of beer, 5 oz of wine, or 1 oz of hard liquor.   Reading food labels  Check food labels for: ? Trans fats. ? Partially hydrogenated oils. ? Saturated fat (g) in each serving. ? Cholesterol (mg) in each serving. ? Fiber (g) in each serving.  Choose foods with healthy fats, such as: ? Monounsaturated fats. ? Polyunsaturated fats. ? Omega-3 fats.  Choose grain products that have whole grains. Look for the word "whole" as the first word in the ingredient list. Cooking  Cook foods using low-fat methods. These include baking, boiling, grilling, and broiling.  Eat more home-cooked foods. Eat at restaurants and buffets less often.  Avoid cooking using saturated fats, such as butter, cream, palm oil, palm kernel oil, and coconut oil. Recommended foods Fruits  All fresh, canned (in natural juice), or frozen fruits. Vegetables  Fresh or frozen vegetables (raw, steamed, roasted, or grilled). Green salads. Grains  Whole grains, such as whole  wheat or whole grain breads, crackers, cereals, and pasta. Unsweetened oatmeal, bulgur, barley, quinoa, or brown rice. Corn or whole wheat flour tortillas. Meats and other protein foods  Ground beef (85% or leaner), grass-fed beef, or beef trimmed of fat. Skinless chicken or [place]. Ground chicken or [place]. Pork trimmed of fat. All fish and seafood. Egg whites. Dried beans, peas, or lentils. Unsalted nuts or seeds. Unsalted canned beans. Nut butters without added sugar or oil. Dairy  Low-fat or nonfat dairy products, such as skim or 1% milk, 2% or reduced-fat cheeses, low-fat and fat-free ricotta or cottage cheese, or plain low-fat and nonfat yogurt. Fats and oils  Tub margarine without trans fats.  Light or reduced-fat mayonnaise and salad dressings. Avocado. Olive, canola, sesame, or safflower oils. The items listed above may not be a complete list of foods and beverages you can eat. Contact a dietitian for more information.   Foods to avoid Fruits  Canned fruit in heavy syrup. Fruit in cream or butter sauce. Fried fruit. Vegetables  Vegetables cooked in cheese, cream, or butter sauce. Fried vegetables. Grains  White bread. White pasta. White rice. Cornbread. Bagels, pastries, and croissants. Crackers and snack foods that contain trans fat and hydrogenated oils. Meats and other protein foods  Fatty cuts of meat. Ribs, chicken wings, bacon, sausage, bologna, salami, chitterlings, fatback, hot dogs, bratwurst, and packaged lunch meats. Liver and organ meats. Whole eggs and egg yolks. Chicken and [place] with skin. Fried meat. Dairy  Whole or 2% milk, cream, half-and-half, and cream cheese. Whole milk cheeses. Whole-fat or sweetened yogurt. Full-fat cheeses. Nondairy creamers and whipped toppings. Processed cheese, cheese spreads, and cheese curds. Beverages  Alcohol. Sugar-sweetened drinks such as sodas, lemonade, and fruit drinks. Fats and oils  Butter, stick margarine, lard,  shortening, ghee, or bacon fat. Coconut, palm kernel, and palm oils. Sweets and desserts  Corn syrup, sugars, honey, and molasses. Candy. Jam and jelly. Syrup. Sweetened cereals. Cookies, pies, cakes, donuts, muffins, and ice cream. The items listed above may not be a complete list of foods and beverages you should avoid. Contact a dietitian for more information. Summary  Choosing the right foods helps keep your fat and cholesterol at normal levels. This can keep you from getting certain diseases.  At meals, fill one-half of your plate with vegetables and green salads.  Eat high-fiber foods, like whole grains, beans, apples, carrots, peas, and barley.  Limit added sugar, saturated fats, alcohol, and fried foods. This information is not intended to replace advice given to you by your health care provider. Make sure you discuss any questions you have with your health care provider. Document Revised: 10/16/2019 Document Reviewed: 10/16/2019 Elsevier Patient Education  2021 [employer]. High Cholesterol  High cholesterol is a condition in which the blood has high levels of a white, waxy substance similar to fat (cholesterol). The liver makes all the cholesterol that the body needs. The human body needs small amounts of cholesterol to help build cells. A person gets extra or excess cholesterol from the food that he or she eats. The blood carries cholesterol from the liver to the rest of the body. If you have high cholesterol, deposits (plaques) may build up on the walls of your arteries. Arteries are the blood vessels that carry blood away from your heart. These plaques make the arteries narrow and stiff. Cholesterol plaques increase your risk for heart attack and stroke. Work with your health care provider to keep your cholesterol levels in a healthy range. What increases the risk? The following factors may make you more likely to develop this condition:  Eating foods that are high in animal  fat (saturated fat) or cholesterol.  Being overweight.  Not getting enough exercise.  A family history of high cholesterol (familial hypercholesterolemia).  Use of tobacco products.  Having diabetes. What are the signs or symptoms? There are no symptoms of this condition. How is this diagnosed? This condition may be diagnosed based on the results of a blood test.  If you are older than 68 years of age, your health care provider may check your cholesterol levels every 4-6 years.  You may be checked more often if you have  high cholesterol or other risk factors for heart disease. The blood test for cholesterol measures:  "Bad" cholesterol, or LDL cholesterol. This is the main type of cholesterol that causes heart disease. The desired level is less than 100 mg/dL.  "Good" cholesterol, or HDL cholesterol. HDL helps protect against heart disease by cleaning the arteries and carrying the LDL to the liver for processing. The desired level for HDL is 60 mg/dL or higher.  Triglycerides. These are fats that your body can store or burn for energy. The desired level is less than 150 mg/dL.  Total cholesterol. This measures the total amount of cholesterol in your blood and includes LDL, HDL, and triglycerides. The desired level is less than 200 mg/dL. How is this treated? This condition may be treated with:  Diet changes. You may be asked to eat foods that have more fiber and less saturated fats or added sugar.  Lifestyle changes. These may include regular exercise, maintaining a healthy weight, and quitting use of tobacco products.  Medicines. These are given when diet and lifestyle changes have not worked. You may be prescribed a statin medicine to help lower your cholesterol levels. Follow these instructions at home: Eating and drinking  Eat a healthy, balanced diet. This diet includes: ? Daily servings of a variety of fresh, frozen, or canned fruits and vegetables. ? Daily servings of  whole grain foods that are rich in fiber. ? Foods that are low in saturated fats and trans fats. These include poultry and fish without skin, lean cuts of meat, and low-fat dairy products. ? A variety of fish, especially oily fish that contain omega-3 fatty acids. Aim to eat fish at least 2 times a week.  Avoid foods and drinks that have added sugar.  Use healthy cooking methods, such as roasting, grilling, broiling, baking, poaching, steaming, and stir-frying. Do not fry your food except for stir-frying.   Lifestyle  Get regular exercise. Aim to exercise for a total of 150 minutes a week. Increase your activity level by doing activities such as gardening, walking, and taking the stairs.  Do not use any products that contain nicotine or tobacco, such as cigarettes, e-cigarettes, and chewing tobacco. If you need help quitting, ask your health care provider.   General instructions  Take over-the-counter and prescription medicines only as told by your health care provider.  Keep all follow-up visits as told by your health care provider. This is important. Where to find more information  American Heart Association: www.heart.org  National Heart, Lung, and Blood Institute: [employer] Contact a health care provider if:  You have trouble achieving or maintaining a healthy diet or weight.  You are starting an exercise program.  You are unable to stop smoking. Get help right away if:  You have chest pain.  You have trouble breathing.  You have any symptoms of a stroke. "BE FAST" is an easy way to remember the main warning signs of a stroke: ? B - Balance. Signs are dizziness, sudden trouble walking, or loss of balance. ? E - Eyes. Signs are trouble seeing or a sudden change in vision. ? F - Face. Signs are sudden weakness or numbness of the face, or the face or eyelid drooping on one side. ? A - Arms. Signs are weakness or numbness in an arm. This happens suddenly and usually on  one side of the body. ? S - Speech. Signs are sudden trouble speaking, slurred speech, or trouble understanding what people say. ? T -  Time. Time to call emergency services. Write down what time symptoms started.  You have other signs of a stroke, such as: ? A sudden, severe headache with no known cause. ? Nausea or vomiting. ? Seizure. These symptoms may represent a serious problem that is an emergency. Do not wait to see if the symptoms will go away. Get medical help right away. Call your local emergency services (911 in the [place] Do not drive yourself to the hospital. Summary  Cholesterol plaques increase your risk for heart attack and stroke. Work with your health care provider to keep your cholesterol levels in a healthy range.  Eat a healthy, balanced diet, get regular exercise, and maintain a healthy weight.  Do not use any products that contain nicotine or tobacco, such as cigarettes, e-cigarettes, and chewing tobacco.  Get help right away if you have any symptoms of a stroke. This information is not intended to replace advice given to you by your health care provider. Make sure you discuss any questions you have with your health care provider. Document Revised: 05/13/2019 Document Reviewed: 05/13/2019 Elsevier Patient Education  2021 [employer].

## 2020-09-09 NOTE — Progress Notes (Signed)
Established patient visit   Patient: Sierra Brown   DOB: 1950/08/01   70 y.o. Female  MRN: [id number] Visit Date: 09/09/2020  Today's healthcare provider: Jairo Ben, FNP   Chief Complaint  Patient presents with  . Follow-up   Subjective    HPI HPI    Patient returns back to clinic today for one month follow up for palpitations and bruit of right carotid artery. Patient had follow up with cardiology on 08/24/20 who ordered patient to have carotid duplex. Patient states that palpitations and chest pain have resolved. Patient reports that symptoms of fatigue are also improving   Last edited by Fonda Kinder, CMA on 09/09/2020 10:54 AM. (History)     She is feeling better at today's visit.   Patient  denies any fever, body aches,chills, rash, chest pain, shortness of breath, nausea, vomiting, or diarrhea.  Denies dizziness, lightheadedness, pre syncopal or syncopal episodes.       Medications: Outpatient Medications Prior to Visit  Medication Sig  . aspirin EC 81 MG tablet Take 1 tablet (81 mg total) by mouth daily. Swallow whole.  . Cyanocobalamin (B-12) 1000 MCG SUBL Place 1 tablet under the tongue daily.  . fluticasone (FLONASE) 50 MCG/ACT nasal spray Place into the nose.  . hydrochlorothiazide (HYDRODIURIL) 12.5 MG tablet Take 1 tablet (12.5 mg total) by mouth daily.  . Loratadine 10 MG CAPS Take by mouth.  Marland Kitchen omeprazole (PRILOSEC) 20 MG capsule Take 1 capsule (20 mg total) by mouth daily.  . Vitamin D, Ergocalciferol, (DRISDOL) 1.25 MG (50000 UNIT) CAPS capsule Take 1 capsule (50,000 Units total) by mouth every 7 (seven) days. (taking one tablet per week) walk in lab in office 1-2 weeks after completing prescription.   No facility-administered medications prior to visit.    Review of Systems  Constitutional: Negative.   Respiratory: Negative.   Cardiovascular: Negative.   Genitourinary: Negative.   Hematological: Negative.         Objective    BP (!) 139/49   Pulse 76   Resp 15   SpO2 100%  BP Readings from Last 3 Encounters:  09/09/20 (!) 139/49  08/24/20 124/60  08/05/20 (!) 148/72   Wt Readings from Last 3 Encounters:  09/08/20 102 lb (46.3 kg)  08/24/20 106 lb 9.6 oz (48.4 kg)  08/05/20 101 lb 6.4 oz (46 kg)       Physical Exam Vitals reviewed.  Constitutional:      General: She is not in acute distress.    Appearance: She is well-developed. She is not diaphoretic.     Interventions: She is not intubated. HENT:     Head: Normocephalic and atraumatic.     Right Ear: External ear normal.     Left Ear: External ear normal.     Nose: Nose normal.     Mouth/Throat:     Pharynx: No oropharyngeal exudate.  Eyes:     General: Lids are normal. No scleral icterus.       Right eye: No discharge.        Left eye: No discharge.     Conjunctiva/sclera: Conjunctivae normal.     Right eye: Right conjunctiva is not injected. No exudate or hemorrhage.    Left eye: Left conjunctiva is not injected. No exudate or hemorrhage.    Pupils: Pupils are equal, round, and reactive to light.  Neck:     Thyroid: No thyroid mass or thyromegaly.     Vascular:  Normal carotid pulses. No carotid bruit, hepatojugular reflux or JVD.     Trachea: Trachea and phonation normal. No tracheal tenderness or tracheal deviation.     Meningeal: Brudzinski's sign and Kernig's sign absent.  Cardiovascular:     Rate and Rhythm: Normal rate and regular rhythm.     Pulses: Normal pulses.          Radial pulses are 2+ on the right side and 2+ on the left side.       Dorsalis pedis pulses are 2+ on the right side and 2+ on the left side.       Posterior tibial pulses are 2+ on the right side and 2+ on the left side.     Heart sounds: Normal heart sounds, S1 normal and S2 normal. Heart sounds not distant. No murmur heard. No friction rub. No gallop.   Pulmonary:     Effort: Pulmonary effort is normal. No tachypnea, bradypnea, accessory  muscle usage or respiratory distress. She is not intubated.     Breath sounds: Normal breath sounds. No stridor. No wheezing or rales.  Chest:     Chest wall: No tenderness.  Breasts:     Right: No supraclavicular adenopathy.     Left: No supraclavicular adenopathy.    Abdominal:     General: Bowel sounds are normal. There is no distension or abdominal bruit.     Palpations: Abdomen is soft. There is no shifting dullness, fluid wave, hepatomegaly, splenomegaly, mass or pulsatile mass.     Tenderness: There is no abdominal tenderness. There is no guarding or rebound.     Hernia: No hernia is present.  Musculoskeletal:        General: No tenderness or deformity. Normal range of motion.     Cervical back: Full passive range of motion without pain, normal range of motion and neck supple. No edema, erythema or rigidity. No spinous process tenderness or muscular tenderness. Normal range of motion.  Lymphadenopathy:     Head:     Right side of head: No submental, submandibular, tonsillar, preauricular, posterior auricular or occipital adenopathy.     Left side of head: No submental, submandibular, tonsillar, preauricular, posterior auricular or occipital adenopathy.     Cervical: No cervical adenopathy.     Right cervical: No superficial, deep or posterior cervical adenopathy.    Left cervical: No superficial, deep or posterior cervical adenopathy.     Upper Body:     Right upper body: No supraclavicular or pectoral adenopathy.     Left upper body: No supraclavicular or pectoral adenopathy.  Skin:    General: Skin is warm and dry.     Coloration: Skin is not pale.     Findings: No abrasion, bruising, burn, ecchymosis, erythema, lesion, petechiae or rash.     Nails: There is no clubbing.  Neurological:     Mental Status: She is alert and oriented to person, place, and time.     GCS: GCS eye subscore is 4. GCS verbal subscore is 5. GCS motor subscore is 6.     Cranial Nerves: No cranial  nerve deficit.     Sensory: No sensory deficit.     Motor: No tremor, atrophy, abnormal muscle tone or seizure activity.     Coordination: Coordination normal.     Gait: Gait normal.     Deep Tendon Reflexes: Reflexes are normal and symmetric. Reflexes normal. Babinski sign absent on the right side. Babinski sign absent on the left side.  Reflex Scores:      Tricep reflexes are 2+ on the right side and 2+ on the left side.      Bicep reflexes are 2+ on the right side and 2+ on the left side.      Brachioradialis reflexes are 2+ on the right side and 2+ on the left side.      Patellar reflexes are 2+ on the right side and 2+ on the left side.      Achilles reflexes are 2+ on the right side and 2+ on the left side. Psychiatric:        Speech: Speech normal.        Behavior: Behavior normal.        Thought Content: Thought content normal.        Judgment: Judgment normal.       Results for orders placed or performed in visit on 09/09/20  CBC with Differential/Platelet  Result Value Ref Range   WBC 7.1 3.4 - 10.8 x10E3/uL   RBC 4.32 3.77 - 5.28 x10E6/uL   Hemoglobin 14.1 11.1 - 15.9 g/dL   Hematocrit 16.1 09.6 - 46.6 %   MCV 92 79 - 97 fL   MCH 32.6 26.6 - 33.0 pg   MCHC 35.6 31.5 - 35.7 g/dL   RDW 04.5 40.9 - 81.1 %   Platelets 266 150 - 450 x10E3/uL   Neutrophils 64 Not Estab. %   Lymphs 22 Not Estab. %   Monocytes 10 Not Estab. %   Eos 3 Not Estab. %   Basos 1 Not Estab. %   Neutrophils Absolute 4.5 1.4 - 7.0 x10E3/uL   Lymphocytes Absolute 1.6 0.7 - 3.1 x10E3/uL   Monocytes Absolute 0.7 0.1 - 0.9 x10E3/uL   EOS (ABSOLUTE) 0.2 0.0 - 0.4 x10E3/uL   Basophils Absolute 0.1 0.0 - 0.2 x10E3/uL   Immature Granulocytes 0 Not Estab. %   Immature Grans (Abs) 0.0 0.0 - 0.1 x10E3/uL    Assessment & Plan     Leukocytosis, unspecified type - Plan: CBC with Differential/Platelet  History of palpitations in adulthood  Tobacco abuse CBC shows normal WBC now. Palpitations  resolved has cardiology to follow as well.   Smoking cessation instruction/counseling given:  counseled patient on the dangers of tobacco use, advised patient to stop smoking, and reviewed strategies to maximize success  Red Flags discussed. The patient was given clear instructions to go to ER or return to medical center if any red flags develop, symptoms do not improve, worsen or new problems develop. They verbalized understanding.   Return if symptoms worsen or fail to improve, for at any time for any worsening symptoms, Go to Emergency room/ urgent care if worse.      The entirety of the information documented in the History of Present Illness, Review of Systems and Physical Exam were personally obtained by me. Portions of this information were initially documented by the CMA and reviewed by me for thoroughness and accuracy.      Jairo Ben, FNP  [facility] [phone] (phone) [phone] (fax)  [facility]

## 2020-09-11 ENCOUNTER — Telehealth: Payer: Self-pay | Admitting: *Deleted

## 2020-09-11 DIAGNOSIS — D72829 Elevated white blood cell count, unspecified: Secondary | ICD-10-CM | POA: Diagnosis not present

## 2020-09-11 NOTE — Telephone Encounter (Signed)
Notified patient of LDCT lung cancer screening program results with recommendation for 12 month follow up imaging. Also notified of incidental findings noted below and is encouraged to discuss further with PCP who will receive a copy of this note and/or the CT report. Patient verbalizes understanding.   IMPRESSION: 1. Lung-RADS 2, benign appearance or behavior. Continue annual screening with low-dose chest CT without contrast in 12 months. 2. Bilateral renal stones. 3. Aortic atherosclerosis (ICD10-I70.0). Coronary artery calcification. 4.  Emphysema (ICD10-J43.9).

## 2020-09-12 LAB — CBC WITH DIFFERENTIAL/PLATELET
Basophils Absolute: 0.1 10*3/uL (ref 0.0–0.2)
Basos: 1 %
EOS (ABSOLUTE): 0.2 10*3/uL (ref 0.0–0.4)
Eos: 3 %
Hematocrit: 39.6 % (ref 34.0–46.6)
Hemoglobin: 14.1 g/dL (ref 11.1–15.9)
Immature Grans (Abs): 0 10*3/uL (ref 0.0–0.1)
Immature Granulocytes: 0 %
Lymphocytes Absolute: 1.6 10*3/uL (ref 0.7–3.1)
Lymphs: 22 %
MCH: 32.6 pg (ref 26.6–33.0)
MCHC: 35.6 g/dL (ref 31.5–35.7)
MCV: 92 fL (ref 79–97)
Monocytes Absolute: 0.7 10*3/uL (ref 0.1–0.9)
Monocytes: 10 %
Neutrophils Absolute: 4.5 10*3/uL (ref 1.4–7.0)
Neutrophils: 64 %
Platelets: 266 10*3/uL (ref 150–450)
RBC: 4.32 x10E6/uL (ref 3.77–5.28)
RDW: 12 % (ref 11.7–15.4)
WBC: 7.1 10*3/uL (ref 3.4–10.8)

## 2020-09-14 NOTE — Progress Notes (Signed)
CBC is within normal limits.

## 2020-09-14 NOTE — Telephone Encounter (Signed)
Noted Shawn thank you.  Will monitor renal stones, currently she has no symptoms, offered further work up she declined at this time.  We did discuss emphysema.   Will CC: Dr. Rennis Golden her cardiologist as well on this scan as well with the arteriosclerosis.

## 2020-09-14 NOTE — Telephone Encounter (Signed)
Thanks .. given her coronary calcium findings on CT, would recommend moderate potency statin - we will start Atorvastatin 40 mg daily -repeat lipid in 3 months.  Dr. Rexene Edison

## 2020-09-16 ENCOUNTER — Other Ambulatory Visit: Payer: Self-pay | Admitting: Adult Health

## 2020-09-16 DIAGNOSIS — I2510 Atherosclerotic heart disease of native coronary artery without angina pectoris: Secondary | ICD-10-CM | POA: Insufficient documentation

## 2020-09-16 MED ORDER — ATORVASTATIN CALCIUM 40 MG PO TABS: 40.0000 mg | ORAL_TABLET | Freq: Every day | ORAL | 3 refills | Status: DC

## 2020-09-16 NOTE — Telephone Encounter (Signed)
Attempted to reach patient home phone is disconnected, cell phone voicmail box has not been set up yet. If patient returns call okay for [facility] triage nurse to speak with patient and advise them of providers message as below.KW

## 2020-09-16 NOTE — Telephone Encounter (Signed)
Meds ordered this encounter  Medications  . atorvastatin (LIPITOR) 40 MG tablet    Sig: Take 1 tablet (40 mg total) by mouth daily.    Dispense:  90 tablet    Refill:  3   Discussed coronary calcifications on CT Scan with her cardiologist who recommends statin as above.  Recheck lipid panel and CMP around 3 months.  Also please inform patient to schedule office visit if any unusual joint pain, myalgias, or any unwanted side effects. Future lab order were placed.   Pt given above results and recommendations per Sierra Brown; she verbalized understanding and will pick meds up.

## 2020-09-16 NOTE — Telephone Encounter (Signed)
Meds ordered this encounter  Medications   atorvastatin (LIPITOR) 40 MG tablet    Sig: Take 1 tablet (40 mg total) by mouth daily.    Dispense:  90 tablet    Refill:  3   Discussed coronary calcifications on CT Scan with her cardiologist who recommends statin as above.  Recheck lipid panel and CMP around 3 months.  Also please inform patient to schedule office visit if any unusual joint pain, myalgias, or any unwanted side effects. Future lab order were placed.

## 2020-09-16 NOTE — Progress Notes (Signed)
Meds ordered this encounter  Medications  . atorvastatin (LIPITOR) 40 MG tablet    Sig: Take 1 tablet (40 mg total) by mouth daily.    Dispense:  90 tablet    Refill:  3   Orders Placed This Encounter  Procedures  . Lipid Panel w/o Chol/HDL Ratio  . Comprehensive Metabolic Panel (CMET)

## 2020-09-16 NOTE — Telephone Encounter (Signed)
Thanks for taking care of that!  [place]

## 2020-09-24 ENCOUNTER — Other Ambulatory Visit: Payer: Self-pay

## 2020-09-24 ENCOUNTER — Encounter: Payer: Self-pay | Admitting: Gastroenterology

## 2020-09-24 ENCOUNTER — Ambulatory Visit: Payer: Medicare HMO | Admitting: Gastroenterology

## 2020-09-24 VITALS — BP 143/63 | HR 76 | Ht 64.0 in | Wt 104.6 lb

## 2020-09-24 DIAGNOSIS — R112 Nausea with vomiting, unspecified: Secondary | ICD-10-CM | POA: Diagnosis not present

## 2020-09-24 NOTE — Progress Notes (Signed)
Gastroenterology Consultation  Referring Provider:     Stephanie Acre* Primary Care Physician:  Berniece Pap, FNP Primary Gastroenterologist:  Dr. Servando Snare     Reason for Consultation:     Nausea        HPI:   Sierra Brown is a 70 y.o. y/o female referred for consultation & management of nausea by Dr. Ashok Norris, Eula Fried, FNP.  Patient comes in today with a history of nausea.  The patient was seen by her primary care provider who started her on omeprazole and she states that all of her nausea went away.  The patient also reports that her mother had ulcerative colitis and the patient has never had a screening colonoscopy.  The patient denies any heartburn she also denies any vomiting with the nausea.  There is no report of any unexplained weight loss black stools or bloody stools.  She also denies any dysphagia.  Past Medical History:  Diagnosis Date  . Allergy    allerlgic rhinitis  . Cataract   . COPD (chronic obstructive pulmonary disease) (HCC)   . Pneumonia   . Tobacco use disorder     Past Surgical History:  Procedure Laterality Date  . CESAREAN SECTION    . TONSILLECTOMY      Prior to Admission medications   Medication Sig Start Date End Date Taking? Authorizing Provider  aspirin EC 81 MG tablet Take 1 tablet (81 mg total) by mouth daily. Swallow whole. 08/05/20  Yes Flinchum, Eula Fried, FNP  atorvastatin (LIPITOR) 40 MG tablet Take 1 tablet (40 mg total) by mouth daily. 09/16/20  Yes Flinchum, Eula Fried, FNP  Cyanocobalamin (B-12) 1000 MCG SUBL Place 1 tablet under the tongue daily. 08/06/20  Yes Flinchum, Eula Fried, FNP  fluticasone (FLONASE) 50 MCG/ACT nasal spray Place into the nose.   Yes [provider]  hydrochlorothiazide (HYDRODIURIL) 12.5 MG tablet Take 1 tablet (12.5 mg total) by mouth daily. 08/05/20  Yes Flinchum, Eula Fried, FNP  Loratadine 10 MG CAPS Take by mouth.   Yes [provider]  omeprazole (PRILOSEC) 20 MG capsule  Take 1 capsule (20 mg total) by mouth daily. 08/05/20  Yes Flinchum, Eula Fried, FNP  Vitamin D, Ergocalciferol, (DRISDOL) 1.25 MG (50000 UNIT) CAPS capsule Take 1 capsule (50,000 Units total) by mouth every 7 (seven) days. (taking one tablet per week) walk in lab in office 1-2 weeks after completing prescription. 08/06/20  Yes Flinchum, Eula Fried, FNP    Family History  Problem Relation Age of Onset  . Ulcerative colitis Mother        colostomy  . Hypertension Mother   . Heart Problems Mother   . Breast cancer Neg Hx      Social History   Tobacco Use  . Smoking status: Current Every Day Smoker    Packs/day: 0.75    Years: 49.00    Pack years: 36.75    Types: Cigarettes  . Smokeless tobacco: Never Used  Substance Use Topics  . Alcohol use: Never    Allergies as of 09/24/2020 - Review Complete 09/24/2020  Allergen Reaction Noted  . Codeine  05/25/2007  . Levofloxacin  09/05/2007  . Sulfonamide derivatives  05/25/2007  . Penicillins Rash 05/25/2007  . Sulfa antibiotics Rash 05/25/2007    Review of Systems:    All systems reviewed and negative except where noted in HPI.   Physical Exam:  BP (!) 143/63   Pulse 76   Ht 5\' 4"  (  1.626 m)   Wt 104 lb 9.6 oz (47.4 kg)   BMI 17.95 kg/m  No LMP recorded. Patient is postmenopausal. General:   Alert,  Well-developed, well-nourished, pleasant and cooperative in NAD Head:  Normocephalic and atraumatic. Eyes:  Sclera clear, no icterus.   Conjunctiva pink. Ears:  Normal auditory acuity. Neck:  Supple; no masses or thyromegaly. Lungs:  Respirations even and unlabored.  Clear throughout to auscultation.   No wheezes, crackles, or rhonchi. No acute distress. Heart:  Regular rate and rhythm; no murmurs, clicks, rubs, or gallops. Abdomen:  Normal bowel sounds.  No bruits.  Soft, non-tender and non-distended without masses, hepatosplenomegaly or hernias noted.  No guarding or rebound tenderness.  Negative Carnett sign.   Rectal:   Deferred.  Pulses:  Normal pulses noted. Extremities:  No clubbing or edema.  No cyanosis. Neurologic:  Alert and oriented x3;  grossly normal neurologically. Skin:  Intact without significant lesions or rashes.  No jaundice. Lymph Nodes:  No significant cervical adenopathy. Psych:  Alert and cooperative. Normal mood and affect.  Imaging Studies: MM 3D SCREEN BREAST BILATERAL  Result Date: 09/03/2020 CLINICAL DATA:  Screening. EXAM: DIGITAL SCREENING BILATERAL MAMMOGRAM WITH TOMOSYNTHESIS AND CAD TECHNIQUE: Bilateral screening digital craniocaudal and mediolateral oblique mammograms were obtained. Bilateral screening digital breast tomosynthesis was performed. The images were evaluated with computer-aided detection. COMPARISON:  None. ACR Breast Density Category b: There are scattered areas of fibroglandular density. FINDINGS: There are no findings suspicious for malignancy. The images were evaluated with computer-aided detection. IMPRESSION: No mammographic evidence of malignancy. A result letter of this screening mammogram will be mailed directly to the patient. RECOMMENDATION: Screening mammogram in one year. (Code:[id number]-B-01Y) BI-RADS CATEGORY  1: Negative. Electronically Signed   By: Emmaline Kluver M.D.   On: 09/03/2020 10:35   CT CHEST LUNG CANCER SCREENING LOW DOSE WO CONTRAST  Result Date: 09/09/2020 CLINICAL DATA:  Current smoker, 37 pack-year history. EXAM: CT CHEST WITHOUT CONTRAST LOW-DOSE FOR LUNG CANCER SCREENING TECHNIQUE: Multidetector CT imaging of the chest was performed following the standard protocol without IV contrast. COMPARISON:  Chest radiograph 04/14/2013. FINDINGS: Cardiovascular: Atherosclerotic calcification of the aorta and coronary arteries. Heart size normal. No pericardial effusion. Mediastinum/Nodes: No pathologically enlarged mediastinal or axillary lymph nodes. Hilar regions are difficult to definitively evaluate without IV contrast but appear grossly unremarkable.  Esophagus is grossly unremarkable. Lungs/Pleura: Biapical pleuroparenchymal scarring, nodular on the right, similar to chest radiograph 04/14/2013. Centrilobular and paraseptal emphysema. Calcified granulomas. 4.5 mm subpleural posterior left upper lobe nodule. No suspicious pulmonary nodules. No pleural fluid. Airway is unremarkable. Upper Abdomen: Low-attenuation lesions in the liver measure up to 11 mm and are likely cysts. Visualized portions of the liver and adrenal glands are unremarkable. Stones are seen in the kidneys bilaterally. Visualized portions of the spleen, pancreas and stomach are grossly unremarkable. Musculoskeletal: No worrisome lytic or sclerotic lesions. Osteopenia. IMPRESSION: 1. Lung-RADS 2, benign appearance or behavior. Continue annual screening with low-dose chest CT without contrast in 12 months. 2. Bilateral renal stones. 3. Aortic atherosclerosis (ICD10-I70.0). Coronary artery calcification. 4.  Emphysema (ICD10-J43.9). Electronically Signed   By: Leanna Battles M.D.   On: 09/09/2020 08:43   VAS US CAROTID  Result Date: 09/08/2020 Carotid Arterial Duplex Study Indications:       Carotid bruit and patient denies any cerebrovascular                    symptoms. Risk Factors:      Current smoker.  Other Factors:     COPD. Comparison Study:  NA Performing Technologist: Tyna JakschKeisha Barrett RVT  Examination Guidelines: A complete evaluation includes B-mode imaging, spectral Doppler, color Doppler, and power Doppler as needed of all accessible portions of each vessel. Bilateral testing is considered an integral part of a complete examination. Limited examinations for reoccurring indications may be performed as noted.  Right Carotid Findings: +----------+--------+--------+--------+------------------+---------+           PSV cm/sEDV cm/sStenosisPlaque DescriptionComments  +----------+--------+--------+--------+------------------+---------+ CCA Prox  109     13                                           +----------+--------+--------+--------+------------------+---------+ CCA Distal93      14                                          +----------+--------+--------+--------+------------------+---------+ ICA Prox  91      13      1-39%   heterogenous                +----------+--------+--------+--------+------------------+---------+ ICA Mid   81      25                                          +----------+--------+--------+--------+------------------+---------+ ICA Distal113     38                                          +----------+--------+--------+--------+------------------+---------+ ECA       127     8               heterogenous      shadowing +----------+--------+--------+--------+------------------+---------+ +----------+--------+-------+---------+-------------------+           PSV cm/sEDV cmsDescribe Arm Pressure (mmHG) +----------+--------+-------+---------+-------------------+ [id number]            Turbulent130                 +----------+--------+-------+---------+-------------------+ +---------+--------+-+--------+-+------+ VertebralPSV cm/s0EDV cm/s0Absent +---------+--------+-+--------+-+------+  Left Carotid Findings: +----------+--------+--------+--------+------------------+------------------+           PSV cm/sEDV cm/sStenosisPlaque DescriptionComments           +----------+--------+--------+--------+------------------+------------------+ CCA Prox  82      11                                                   +----------+--------+--------+--------+------------------+------------------+ CCA Distal72      15      <50%    heterogenous                         +----------+--------+--------+--------+------------------+------------------+ ICA Prox  60      15      Normal                                       +----------+--------+--------+--------+------------------+------------------+ ICA  Mid   78      24                                                    +----------+--------+--------+--------+------------------+------------------+ ICA Distal141     41                                tortuous           +----------+--------+--------+--------+------------------+------------------+ ECA       111     8                                 intimal thickening +----------+--------+--------+--------+------------------+------------------+ +----------+--------+--------+----------------+-------------------+           PSV cm/sEDV cm/sDescribe        Arm Pressure (mmHG) +----------+--------+--------+----------------+-------------------+ [id number]             Multiphasic, [id number]                 +----------+--------+--------+----------------+-------------------+ +---------+--------+--+--------+--+---------+ VertebralPSV cm/s81EDV cm/s23Antegrade +---------+--------+--+--------+--+---------+   Summary: Right Carotid: Velocities in the right ICA are consistent with a 1-39% stenosis. Left Carotid: There is no evidence of stenosis in the left ICA.               Non-hemodynamically significant plaque <50% noted in the CCA. The               extracranial vessels were near-normal with only minimal wall               thickening or plaque. Vertebrals:  Left vertebral artery demonstrates antegrade flow. Right vertebral              artery demonstrates an occlusion. Subclavians: Right subclavian artery flow was disturbed. Normal flow              hemodynamics were seen in the left subclavian artery. *See table(s) above for measurements and observations. Suggest follow up PRN. Electronically signed by Nanetta Batty MD on 09/08/2020 at 11:10:26 AM.    Final     Assessment and Plan:   SATOMI BUDA is a 70 y.o. y/o female who comes in today with a history of nausea that is completely resolved with omeprazole.  The patient has never had a screening colonoscopy and has a family history of inflammatory  bowel disease in her mother with ulcerative colitis.  The patient will be set up for a screening colonoscopy.  The patient has been explained the plan and agrees with it.    Sierra Minium, MD. Clementeen Graham    Note: This dictation was prepared with Dragon dictation along with smaller phrase technology. Any transcriptional errors that result from this process are unintentional.

## 2020-09-28 ENCOUNTER — Telehealth: Payer: Self-pay | Admitting: Adult Health

## 2020-09-28 NOTE — Telephone Encounter (Signed)
Try Crestor 5 mg daily instead.  Okay to send new prescription for #90 with 1 refill.  Please advise patient to take co-Q10 100 to 200 mg daily along with the statin to avoid myalgias.

## 2020-09-28 NOTE — Telephone Encounter (Signed)
Please advise 

## 2020-09-28 NOTE — Telephone Encounter (Signed)
Pt called to report adverse side effects of Lipitor, she has muscle/joint pain in her legs. States that she was instructed to call the office in the case of complications. Feels like her legs are aching all the time.   Needs alternative option  Best contact: [phone]

## 2020-09-29 MED ORDER — ROSUVASTATIN CALCIUM 5 MG PO TABS: 5.0000 mg | ORAL_TABLET | Freq: Every day | ORAL | 1 refills | Status: DC

## 2020-09-29 NOTE — Telephone Encounter (Signed)
Patient advised as below. Patient verbalizes understanding and is in agreement with treatment plan.  

## 2020-10-08 ENCOUNTER — Other Ambulatory Visit: Payer: Self-pay

## 2020-10-08 ENCOUNTER — Ambulatory Visit: Payer: Medicare HMO | Admitting: Internal Medicine

## 2020-10-08 ENCOUNTER — Encounter: Payer: Self-pay | Admitting: Internal Medicine

## 2020-10-08 VITALS — BP 132/76 | HR 75 | Ht 65.0 in | Wt 102.4 lb

## 2020-10-08 DIAGNOSIS — I2510 Atherosclerotic heart disease of native coronary artery without angina pectoris: Secondary | ICD-10-CM

## 2020-10-08 DIAGNOSIS — I10 Essential (primary) hypertension: Secondary | ICD-10-CM

## 2020-10-08 DIAGNOSIS — F17200 Nicotine dependence, unspecified, uncomplicated: Secondary | ICD-10-CM | POA: Diagnosis not present

## 2020-10-08 DIAGNOSIS — R69 Illness, unspecified: Secondary | ICD-10-CM | POA: Diagnosis not present

## 2020-10-08 DIAGNOSIS — I779 Disorder of arteries and arterioles, unspecified: Secondary | ICD-10-CM

## 2020-10-08 DIAGNOSIS — E782 Mixed hyperlipidemia: Secondary | ICD-10-CM | POA: Diagnosis not present

## 2020-10-08 NOTE — Progress Notes (Signed)
OFFICE CONSULT NOTE  Chief Complaint:  Follow-up carotid bruit  Primary Care Physician: Berniece Pap, FNP  HPI:  Sierra Brown is a 70 y.o. female who is being seen today for the evaluation of carotid bruit at the request of Flinchum, Eula Fried, F*. This is a pleasant 70 year old female whose husband is a patient of mine.  She has a history of COPD, prior pneumonia and tobacco abuse and recently had an exam by her PCP which noted a carotid bruit.  She was then referred to cardiology for further evaluation.  Heart disease is in her family including mother who had hypertension, PAD and A. fib and her brother who had a stroke.  She also reports a recent episode of increased blood pressure and palpitations.  She was started on HCTZ which definitely improved her symptoms and now her palpitations are almost gone.  Blood pressure was excellent today at 124/60.  Her recent lipid profile does show some increased cholesterol with total 201, triglycerides 196, HDL 56 and LDL 111.  10/08/2020  Sierra Brown returns today for follow-up of her carotid bruit.  She underwent carotid Dopplers which showed very minimal bilateral carotid artery disease.  Somewhat worse on the right than the left.  Overall very reassuring and follow-up was recommended as needed.  She does have ongoing tobacco use and is working to try to quit.  Recent lipids showed an LDL of 111 and total cholesterol 201.  She underwent a screening CT of the chest which was negative for any malignancy however she was noted to have multivessel coronary calcification and aortic atherosclerosis.  Based on these findings I agree that she will need more intensive lipid-lowering.  Her PCP had started her on atorvastatin 40 mg however this caused severe myalgias and it was discontinued.  Subsequently she has been switched to rosuvastatin 5 mg and this seems to be working well for her.  I suspect she will ultimately need more dose in order to get to  target but would recommend repeat lipids in about 3 months.  PMHx:  Past Medical History:  Diagnosis Date  . Allergy    allerlgic rhinitis  . Cataract   . COPD (chronic obstructive pulmonary disease) (HCC)   . Pneumonia   . Tobacco use disorder     Past Surgical History:  Procedure Laterality Date  . CESAREAN SECTION    . TONSILLECTOMY      FAMHx:  Family History  Problem Relation Age of Onset  . Ulcerative colitis Mother        colostomy  . Hypertension Mother   . Heart Problems Mother   . Breast cancer Neg Hx     SOCHx:   reports that she has been smoking cigarettes. She has a 36.75 pack-year smoking history. She has never used smokeless tobacco. She reports that she does not drink alcohol. No history on file for drug use.  ALLERGIES:  Allergies  Allergen Reactions  . Codeine     REACTION: nausea and vomiting Other reaction(s): Other (See Comments) REACTION: nausea and vomiting  . Levofloxacin     REACTION: nausea Other reaction(s): Other (See Comments) REACTION: nausea  . Sulfonamide Derivatives     REACTION: hives  . Penicillins Rash    REACTION: hives Other reaction(s): Other (See Comments) REACTION: hives   . Sulfa Antibiotics Rash    REACTION: hives    ROS: Pertinent items noted in HPI and remainder of comprehensive ROS otherwise negative.  HOME MEDS: Current  Outpatient Medications on File Prior to Visit  Medication Sig Dispense Refill  . aspirin EC 81 MG tablet Take 1 tablet (81 mg total) by mouth daily. Swallow whole. 90 tablet 2  . Cyanocobalamin (B-12) 1000 MCG SUBL Place 1 tablet under the tongue daily. 90 tablet 1  . fluticasone (FLONASE) 50 MCG/ACT nasal spray Place into the nose.    . hydrochlorothiazide (HYDRODIURIL) 12.5 MG tablet Take 1 tablet (12.5 mg total) by mouth daily. 90 tablet 0  . Loratadine 10 MG CAPS Take by mouth.    Marland Kitchen omeprazole (PRILOSEC) 20 MG capsule Take 1 capsule (20 mg total) by mouth daily. 90 capsule 0  .  rosuvastatin (CRESTOR) 5 MG tablet Take 1 tablet (5 mg total) by mouth daily. 90 tablet 1  . Vitamin D, Ergocalciferol, (DRISDOL) 1.25 MG (50000 UNIT) CAPS capsule Take 1 capsule (50,000 Units total) by mouth every 7 (seven) days. (taking one tablet per week) walk in lab in office 1-2 weeks after completing prescription. 12 capsule 0   No current facility-administered medications on file prior to visit.    LABS/IMAGING: No results found for this or any previous visit (from the past 48 hour(s)). No results found.  LIPID PANEL:    Component Value Date/Time   CHOL 201 (H) 08/05/2020 1116   TRIG 196 (H) 08/05/2020 1116   HDL 56 08/05/2020 1116   CHOLHDL 3.6 08/05/2020 1116   LDLCALC 111 (H) 08/05/2020 1116    WEIGHTS: Wt Readings from Last 3 Encounters:  10/08/20 102 lb 6.4 oz (46.4 kg)  09/24/20 104 lb 9.6 oz (47.4 kg)  09/08/20 102 lb (46.3 kg)    VITALS: BP 132/76   Pulse 75   Ht 5\' 5"  (1.651 m)   Wt 102 lb 6.4 oz (46.4 kg)   SpO2 100%   BMI 17.04 kg/m   EXAM: Deferred  EKG: Deferred  ASSESSMENT: 1. Possible carotid bruit -minimal bilateral carotid artery disease (08/2020) 2. Multivessel coronary artery calcification, aortic atherosclerosis (CT chest, 08/2020) 3. Hypertension 4. Dyslipidemia, goal LDL less than 70 5. Family history of coronary disease 6. COPD with tobacco abuse  PLAN: 1.   Sierra Brown is doing well and unfortunately was found to have some very mild carotid artery disease.  Blood pressures well controlled but her cholesterol is elevated in comparison to the fact that she was found to have multivessel coronary calcium and atherosclerosis based on a screening lung CT scan recently.  Would target her LDL to less than 70.  Unfortunately she could not tolerate atorvastatin which caused significant myalgias.  She is currently on rosuvastatin and I would recommend a repeat lipid in about 3 months.  If she is still not at target, would consider increasing the  dose further possibly up to 20 mg daily which probably will help her reach her goal.  Follow-up with me in 6 months.  Algie Coffer, MD, [facility], FACP  [place]  [facility]  Medical Director of the [facility]id [facility]  Cardiovascular [facility] Reduction Clinic Diplomate of the American Board of Clinical Lipidology Attending Cardiologist  Direct Dial: [phone]  Fax: [phone]  Website:  [email or web address] Zhania Shaheen 10/08/2020, 11:51 AM

## 2020-10-08 NOTE — Patient Instructions (Signed)
Medication Instructions:  Your physician recommends that you continue on your current medications as directed. Please refer to the Current Medication list given to you today.  *If you need a refill on your cardiac medications before your next appointment, please call your pharmacy*   Lab Work: None ordered  If you have labs (blood work) drawn today and your tests are completely normal, you will receive your results only by: Marland Kitchen MyChart Message (if you have MyChart) OR . A paper copy in the mail If you have any lab test that is abnormal or we need to change your treatment, we will call you to review the results.   Testing/Procedures: None ordered   Follow-Up: At [facility], you and your health needs are our priority.  As part of our continuing mission to provide you with exceptional heart care, we have created designated Provider Care Teams.  These Care Teams include your primary Cardiologist (physician) and Advanced Practice Providers (APPs -  Physician Assistants and Nurse Practitioners) who all work together to provide you with the care you need, when you need it.  We recommend signing up for the patient portal called "MyChart".  Sign up information is provided on this After Visit Summary.  MyChart is used to connect with patients for Virtual Visits (Telemedicine).  Patients are able to view lab/test results, encounter notes, upcoming appointments, etc.  Non-urgent messages can be sent to your provider as well.   To learn more about what you can do with MyChart, go to [email or web address].    Your next appointment:   6 month(s)  The format for your next appointment:   In Person  Provider:   K. Italy Hilty, MD

## 2020-10-27 ENCOUNTER — Telehealth: Payer: Self-pay | Admitting: Adult Health

## 2020-10-28 ENCOUNTER — Other Ambulatory Visit: Payer: Self-pay | Admitting: Adult Health

## 2020-10-28 DIAGNOSIS — R0989 Other specified symptoms and signs involving the circulatory and respiratory systems: Secondary | ICD-10-CM

## 2020-10-28 NOTE — Telephone Encounter (Signed)
Requested medication (s) are due for refill today:  yes   Requested medication (s) are on the active medication list: yes   Last refill:  08/06/2020  Future visit scheduled: yes  Notes to clinic:  this refill cannot be delegated    Requested Prescriptions  Pending Prescriptions Disp Refills   Vitamin D, Ergocalciferol, (DRISDOL) 1.25 MG (50000 UNIT) CAPS capsule [Pharmacy Med Name: VITAMIN D2 50,000IU (ERGO) CAP RX] 12 capsule 0    Sig: TAKE ONE CAPSULE BY MOUTH EVERY 7 DAYS      Endocrinology:  Vitamins - Vitamin D Supplementation Failed - 10/27/2020  5:07 PM      Failed - 50,000 IU strengths are not delegated      Failed - Phosphate in normal range and within 360 days    No results found for: PHOS        Failed - Vitamin D in normal range and within 360 days    Vit D, 25-Hydroxy  Date Value Ref Range Status  08/05/2020 23.3 (L) 30.0 - 100.0 ng/mL Final    Comment:    Vitamin D deficiency has been defined by the Institute of Medicine and an Endocrine Society practice guideline as a level of serum 25-OH vitamin D less than 20 ng/mL (1,2). The Endocrine Society went on to further define vitamin D insufficiency as a level between 21 and 29 ng/mL (2). 1. IOM (Institute of Medicine). 2010. Dietary reference    intakes for calcium and D. [place]: The    [employer]. 2. Holick MF, Binkley [place], Bischoff-Ferrari HA, et al.    Evaluation, treatment, and prevention of vitamin D    deficiency: an Endocrine Society clinical practice    guideline. JCEM. 2011 Jul; 96(7):1911-30.           Passed - Ca in normal range and within 360 days    Calcium  Date Value Ref Range Status  08/05/2020 10.0 8.7 - 10.3 mg/dL Final          Passed - Valid encounter within last 12 months    Recent Outpatient Visits           1 month ago Leukocytosis, unspecified type   [facility] Flinchum, Eula Fried, FNP   2 months ago Bruit of right carotid artery    [facility] Flinchum, Eula Fried, FNP       Future Appointments             In 1 month Flinchum, Eula Fried, FNP [facility]

## 2020-10-28 NOTE — Telephone Encounter (Signed)
No longer a [facility] pt.

## 2020-10-29 ENCOUNTER — Other Ambulatory Visit: Payer: Self-pay | Admitting: Adult Health

## 2020-10-29 DIAGNOSIS — E559 Vitamin D deficiency, unspecified: Secondary | ICD-10-CM

## 2020-10-29 MED ORDER — VITAMIN D (ERGOCALCIFEROL) 1.25 MG (50000 UNIT) PO CAPS: 50000.0000 [IU] | ORAL_CAPSULE | ORAL | 0 refills | Status: DC

## 2020-10-29 NOTE — Telephone Encounter (Signed)
Received and handled thanks.

## 2020-12-10 ENCOUNTER — Ambulatory Visit: Payer: Medicare HMO | Admitting: Adult Health

## 2020-12-21 ENCOUNTER — Ambulatory Visit: Payer: Medicare HMO | Admitting: Adult Health

## 2020-12-22 ENCOUNTER — Telehealth: Payer: Self-pay

## 2020-12-22 NOTE — Telephone Encounter (Signed)
Copied from CRM [id number]. Topic: Appointment Scheduling - Scheduling Inquiry for Clinic >> Dec 22, 2020 10:07 AM Daphine Deutscher D wrote: Reason for CRM: Pt called saying she use to see Marcelino Duster Flinchum at [facility].  She wanted to stay with Marcelino Duster so when she left she schd an appt with her at [facility].  They have cancelled two appts with her and now they tell her she cant see Marcelino Duster until August.  Pt wants to know if she can come back to [facility] and see someone.  She did ask for Traci Sermon when she called.  CB# [phone]

## 2020-12-24 NOTE — Telephone Encounter (Signed)
Pt calling again to speak with someone regarding this. Please advise.

## 2020-12-24 NOTE — Telephone Encounter (Signed)
Patient scheduled with Merita Norton.

## 2020-12-24 NOTE — Telephone Encounter (Signed)
Since she did not establish at [facility], she is still a [facility] patient. We can see if someone has availability to see her or she could see Robynn Pane when she starts next month.

## 2020-12-27 ENCOUNTER — Other Ambulatory Visit: Payer: Self-pay | Admitting: Family Medicine

## 2020-12-27 DIAGNOSIS — R0989 Other specified symptoms and signs involving the circulatory and respiratory systems: Secondary | ICD-10-CM

## 2020-12-27 NOTE — Telephone Encounter (Signed)
last RF 10/28/20 #90///requested too soon

## 2021-01-20 ENCOUNTER — Ambulatory Visit (INDEPENDENT_AMBULATORY_CARE_PROVIDER_SITE_OTHER): Payer: Medicare HMO | Admitting: Family Medicine

## 2021-01-20 ENCOUNTER — Encounter: Payer: Self-pay | Admitting: Family Medicine

## 2021-01-20 ENCOUNTER — Other Ambulatory Visit: Payer: Self-pay

## 2021-01-20 VITALS — BP 157/51 | HR 65 | Resp 16 | Wt 103.5 lb

## 2021-01-20 DIAGNOSIS — I10 Essential (primary) hypertension: Secondary | ICD-10-CM | POA: Insufficient documentation

## 2021-01-20 DIAGNOSIS — Z720 Tobacco use: Secondary | ICD-10-CM

## 2021-01-20 DIAGNOSIS — E559 Vitamin D deficiency, unspecified: Secondary | ICD-10-CM | POA: Diagnosis not present

## 2021-01-20 DIAGNOSIS — R002 Palpitations: Secondary | ICD-10-CM

## 2021-01-20 DIAGNOSIS — E538 Deficiency of other specified B group vitamins: Secondary | ICD-10-CM | POA: Diagnosis not present

## 2021-01-20 DIAGNOSIS — E785 Hyperlipidemia, unspecified: Secondary | ICD-10-CM | POA: Diagnosis not present

## 2021-01-20 MED ORDER — LISINOPRIL 5 MG PO TABS: 5.0000 mg | ORAL_TABLET | Freq: Every day | ORAL | 3 refills | Status: DC

## 2021-01-20 NOTE — Assessment & Plan Note (Signed)
Pt tolerating change in statin. Denies any complaints of myalgias Had previously taken with Co-Q 10 as recommended by peer to assist with side effects Lipid panel done today given elevated ASCVD score and pt aware of potential need for increase dose. Low fat, high fiber diet encouraged.

## 2021-01-20 NOTE — Assessment & Plan Note (Signed)
Pt stopped HCTZ within last week Complaints of palpitations and dizzy spells; felt unsafe driving alone. Denies syncope or pre-syncope. No CP or SOB, endorses tingling over complete L side of her body. Denies dehydration- encouraged water and drinks without caffeine to assist with prevention Record of home Bps provided by pt: SBPs 110-131 and DBPs 54-67, HR 60-80s. Pt started on low dose ACEi today, plan to f/u in 3 months or sooner if needed; HCTZ not restarted

## 2021-01-20 NOTE — Progress Notes (Signed)
Established patient visit   Patient: Sierra Brown   DOB: [birth year]   70 y.o. Female  MRN: [id number] Visit Date: 01/20/2021  Today's healthcare provider: Jacky KindleElise T Jalesha Plotz, FNP   Chief Complaint  Patient presents with   Hypertension   Vitamin D Defiency   Subjective    HPI  Hypertension, follow-up  BP Readings from Last 3 Encounters:  01/20/21 (!) 157/51  10/08/20 132/76  09/24/20 (!) 143/63   Wt Readings from Last 3 Encounters:  01/20/21 103 lb 8 oz (46.9 kg)  10/08/20 102 lb 6.4 oz (46.4 kg)  09/24/20 104 lb 9.6 oz (47.4 kg)       She reports fair compliance with treatment. Patient reports that she discontinued HCTZ 8 days ago due to side effects.  She is having side effects. Patient reports that she had decreased energy, light headed/dizzy and reports tingling down left arm. She is following a Regular diet. She is exercising. She does smoke.  Use of agents associated with hypertension: NSAIDS.   Outside blood pressures are systolic ranging from 110-135 and diastolic 54-67. Symptoms: No chest pain No chest pressure  No palpitations No syncope  Yes dyspnea No orthopnea  No paroxysmal nocturnal dyspnea No lower extremity edema   Pertinent labs: Lab Results  Component Value Date   CHOL 201 (H) 08/05/2020   HDL 56 08/05/2020   LDLCALC 111 (H) 08/05/2020   TRIG 196 (H) 08/05/2020   CHOLHDL 3.6 08/05/2020   Lab Results  Component Value Date   NA 141 08/05/2020   K 4.8 08/05/2020   CREATININE 0.76 08/05/2020   GFRNONAA 81 08/05/2020   GFRAA 94 08/05/2020   GLUCOSE 68 08/05/2020     The 10-year ASCVD risk score Denman George(Goff DC Jr., et al., 2013) is: 22.7%   ---------------------------------------------------------------------------------------------------  Vitamin D deficiency, follow-up  Lab Results  Component Value Date   VD25OH 23.3 (L) 08/05/2020   CALCIUM 10.0 08/05/2020   CALCIUM 9.6 06/22/2020  ) Wt Readings from Last 3 Encounters:   01/20/21 103 lb 8 oz (46.9 kg)  10/08/20 102 lb 6.4 oz (46.4 kg)  09/24/20 104 lb 9.6 oz (47.4 kg)    She was last seen for vitamin D deficiency 5 months ago.  Management since that visit includes none. She reports excellent compliance with treatment. She is not having side effects.   Symptoms: Yes change in energy level Yes numbness or tingling  No bone pain No unexplained fracture   --------------------------------------------------------------------------------------------------- Lipid/Cholesterol, Follow-up  Last lipid panel Other pertinent labs  Lab Results  Component Value Date   CHOL 201 (H) 08/05/2020   HDL 56 08/05/2020   LDLCALC 111 (H) 08/05/2020   TRIG 196 (H) 08/05/2020   CHOLHDL 3.6 08/05/2020   Lab Results  Component Value Date   ALT 13 08/05/2020   AST 17 08/05/2020   PLT 266 09/11/2020   TSH 0.999 08/05/2020     She was last seen for this 5 months ago.  Management since that visit includes 09/28/20  started patient on Crestor 5 mg.  She reports fair compliance with treatment. She is having side effects.   Symptoms: No chest pain No chest pressure/discomfort  No dyspnea No lower extremity edema  Yes numbness or tingling of extremity No orthopnea  Yes palpitations No paroxysmal nocturnal dyspnea  No speech difficulty No syncope   Current diet: in general, a "healthy" diet   Current exercise: housecleaning  The 10-year ASCVD risk score Denman George(Goff DC  Montez Hageman., et al., 2013) is: 22.7%  ---------------------------------------------------------------------------------------------------  Patient Active Problem List   Diagnosis Date Noted   Vitamin D deficiency 01/20/2021   B12 deficiency 01/20/2021   Hypertension 01/20/2021   Hyperlipidemia 01/20/2021   Coronary artery calcification seen on CT scan 09/16/2020   Dental infection 08/05/2020   Nausea 08/05/2020   Screening for blood or protein in urine 08/05/2020   Tick bite 08/05/2020   Palpitations  08/05/2020   Bruit of left carotid artery 08/05/2020   History of palpitations in adulthood 08/05/2020   Hematuria 08/05/2020   CERUMEN IMPACTION, BILATERAL 08/15/2008   URI 01/21/2008   SINUSITIS- ACUTE-NOS 08/30/2007   Tobacco abuse 05/25/2007   Allergic rhinitis 10/24/2006   Past Medical History:  Diagnosis Date   Allergy    allerlgic rhinitis   Cataract    COPD (chronic obstructive pulmonary disease) (HCC)    Pneumonia    Tobacco use disorder    Allergies  Allergen Reactions   Atorvastatin Other (See Comments)    Myalgias   Codeine     REACTION: nausea and vomiting Other reaction(s): Other (See Comments) REACTION: nausea and vomiting   Levofloxacin     REACTION: nausea Other reaction(s): Other (See Comments) REACTION: nausea   Sulfonamide Derivatives     REACTION: hives   Penicillins Rash    REACTION: hives Other reaction(s): Other (See Comments) REACTION: hives    Sulfa Antibiotics Rash    REACTION: hives       Medications: Outpatient Medications Prior to Visit  Medication Sig   aspirin EC 81 MG tablet Take 1 tablet (81 mg total) by mouth daily. Swallow whole.   Cyanocobalamin (B-12) 1000 MCG SUBL Place 1 tablet under the tongue daily.   fluticasone (FLONASE) 50 MCG/ACT nasal spray Place into the nose.   Loratadine 10 MG CAPS Take by mouth.   omeprazole (PRILOSEC) 20 MG capsule Take 1 capsule (20 mg total) by mouth daily.   rosuvastatin (CRESTOR) 5 MG tablet Take 1 tablet (5 mg total) by mouth daily.   Vitamin D, Ergocalciferol, (DRISDOL) 1.25 MG (50000 UNIT) CAPS capsule Take 1 capsule (50,000 Units total) by mouth every 7 (seven) days. (taking one tablet per week) keep follow up and check vitamin d level.   hydrochlorothiazide (HYDRODIURIL) 12.5 MG tablet TAKE 1 TABLET(12.5 MG) BY MOUTH DAILY (Patient not taking: Reported on 01/20/2021)   No facility-administered medications prior to visit.    Review of Systems  Constitutional:  Positive for fatigue.  Negative for activity change and appetite change.  Respiratory: Negative.    Cardiovascular:  Positive for palpitations. Negative for chest pain and leg swelling.  Musculoskeletal:  Positive for arthralgias. Negative for myalgias.  Skin: Negative.   Neurological:  Positive for dizziness and numbness. Negative for weakness, light-headedness and headaches.  Psychiatric/Behavioral: Negative.     Last metabolic panel Lab Results  Component Value Date   GLUCOSE 68 08/05/2020   NA 141 08/05/2020   K 4.8 08/05/2020   CL 102 08/05/2020   CO2 23 08/05/2020   BUN 13 08/05/2020   CREATININE 0.76 08/05/2020   GFRNONAA 81 08/05/2020   GFRAA 94 08/05/2020   CALCIUM 10.0 08/05/2020   PROT 7.0 08/05/2020   ALBUMIN 4.7 08/05/2020   LABGLOB 2.3 08/05/2020   AGRATIO 2.0 08/05/2020   BILITOT 0.4 08/05/2020   ALKPHOS 94 08/05/2020   AST 17 08/05/2020   ALT 13 08/05/2020   Last lipids Lab Results  Component Value Date   CHOL 201 (H)  08/05/2020   HDL 56 08/05/2020   LDLCALC 111 (H) 08/05/2020   TRIG 196 (H) 08/05/2020   CHOLHDL 3.6 08/05/2020       Objective    BP (!) 157/51   Pulse 65   Resp 16   Wt 103 lb 8 oz (46.9 kg)   SpO2 100%   BMI 17.22 kg/m  BP Readings from Last 3 Encounters:  01/20/21 (!) 157/51  10/08/20 132/76  09/24/20 (!) 143/63   Wt Readings from Last 3 Encounters:  01/20/21 103 lb 8 oz (46.9 kg)  10/08/20 102 lb 6.4 oz (46.4 kg)  09/24/20 104 lb 9.6 oz (47.4 kg)       Physical Exam Constitutional:      General: She is not in acute distress.    Appearance: She is underweight. She is not toxic-appearing.  HENT:     Head: Normocephalic and atraumatic.  Neck:     Thyroid: No thyroid mass, thyromegaly or thyroid tenderness.     Vascular: Carotid bruit present.     Comments: Right side; known Carotid US done Cardiovascular:     Rate and Rhythm: Normal rate and regular rhythm.     Pulses: Normal pulses.          Carotid pulses are 2+ on the right side  and 2+ on the left side.      Radial pulses are 2+ on the right side and 2+ on the left side.       Femoral pulses are 2+ on the right side and 2+ on the left side.      Popliteal pulses are 2+ on the right side and 2+ on the left side.       Dorsalis pedis pulses are 2+ on the right side and 2+ on the left side.       Posterior tibial pulses are 2+ on the right side and 2+ on the left side.     Heart sounds: Normal heart sounds, S1 normal and S2 normal. No murmur heard.   No friction rub. No gallop.  Pulmonary:     Effort: Pulmonary effort is normal.     Breath sounds: Normal breath sounds.  Musculoskeletal:     Right lower leg: No edema.     Left lower leg: No edema.  Neurological:     Mental Status: She is alert.      No results found for any visits on 01/20/21.  Assessment & Plan     Problem List Items Addressed This Visit       Cardiovascular and Mediastinum   Hypertension    Pt stopped HCTZ within last week Complaints of palpitations and dizzy spells; felt unsafe driving alone. Denies syncope or pre-syncope. No CP or SOB, endorses tingling over complete L side of her body. Denies dehydration- encouraged water and drinks without caffeine to assist with prevention Record of home Bps provided by pt: SBPs 110-131 and DBPs 54-67, HR 60-80s. Pt started on low dose ACEi today, plan to f/u in 3 months or sooner if needed; HCTZ not restarted        Relevant Medications   lisinopril (ZESTRIL) 5 MG tablet   Other Relevant Orders   CBC with Differential/Platelet   Comprehensive metabolic panel   Magnesium     Other   Tobacco abuse    Low dose CT completed this year; Stable- no signs of lung cancer Pt continues to smoke, knows concerns related to smoking given HTN and HLD Refused smoking  cessation at this time; ensured pt that it is never too late to quit SPO2 normal with RA; Lungs CTAB       Palpitations - Primary    Infrequent, has had previously with benign  workup Associated with dizziness and concern for safety with driving Pt self discontinued HCTZ for BP mgmt; started on ACEi today given elevated BP in office and access to home BP cuff as well as elevated ASCVD risk- pt educated on this as risk of heart or stroke occurrence within the next ten years.   The 10-year ASCVD risk score Denman George DC Jr., et al., 2013) is: 22.7%   Values used to calculate the score:     Age: 70 years     Sex: Female     Is Non-Hispanic African American: No     Diabetic: No     Tobacco smoker: Yes     Systolic Blood Pressure: 157 mmHg     Is BP treated: Yes     HDL Cholesterol: 56 mg/dL     Total Cholesterol: 201 mg/dL  TSH and Mag levels also drawn       Relevant Orders   Magnesium   TSH   Vitamin D deficiency    Continues to have complaints of fatigue Missed previous lab work to check effectiveness of supplementation Pt continues to take without difficulty 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       Relevant Orders   VITAMIN D 25 Hydroxy (Vit-D Deficiency, Fractures)   B12 deficiency    Known deficiency; no symptoms at this time Continue to eat balanced diet       Hyperlipidemia    Pt tolerating change in statin. Denies any complaints of myalgias Had previously taken with Co-Q 10 as recommended by peer to assist with side effects Lipid panel done today given elevated ASCVD score and pt aware of potential need for increase dose. Low fat, high fiber diet encouraged.       Relevant Medications   lisinopril (ZESTRIL) 5 MG tablet   Other Relevant Orders   Lipid panel     3 month f/u for HTN or sooner if needed.  Leilani Merl, FNP, have reviewed all documentation for this visit. The documentation on 01/20/21 for the exam, diagnosis, procedures, and orders are all accurate and complete.  Jacky Kindle, FNP  [facility] [phone] (phone) [phone] (fax)  [facility]

## 2021-01-20 NOTE — Assessment & Plan Note (Signed)
Known deficiency; no symptoms at this time Continue to eat balanced diet

## 2021-01-20 NOTE — Assessment & Plan Note (Signed)
Infrequent, has had previously with benign workup Associated with dizziness and concern for safety with driving Pt self discontinued HCTZ for BP mgmt; started on ACEi today given elevated BP in office and access to home BP cuff as well as elevated ASCVD risk- pt educated on this as risk of heart or stroke occurrence within the next ten years.   The 10-year ASCVD risk score Denman George DC Montez Hageman., et al., 2013) is: 22.7%   Values used to calculate the score:     Age: 70 years     Sex: Female     Is Non-Hispanic African American: No     Diabetic: No     Tobacco smoker: Yes     Systolic Blood Pressure: 157 mmHg     Is BP treated: Yes     HDL Cholesterol: 56 mg/dL     Total Cholesterol: 201 mg/dL  TSH and Mag levels also drawn

## 2021-01-20 NOTE — Assessment & Plan Note (Signed)
Low dose CT completed this year; Stable- no signs of lung cancer Pt continues to smoke, knows concerns related to smoking given HTN and HLD Refused smoking cessation at this time; ensured pt that it is never too late to quit SPO2 normal with RA; Lungs CTAB

## 2021-01-20 NOTE — Assessment & Plan Note (Signed)
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

## 2021-01-21 LAB — COMPREHENSIVE METABOLIC PANEL
ALT: 10 IU/L (ref 0–32)
AST: 15 IU/L (ref 0–40)
Albumin/Globulin Ratio: 2.4 — ABNORMAL HIGH (ref 1.2–2.2)
Albumin: 4.4 g/dL (ref 3.8–4.8)
Alkaline Phosphatase: 81 IU/L (ref 44–121)
BUN/Creatinine Ratio: 11 — ABNORMAL LOW (ref 12–28)
BUN: 9 mg/dL (ref 8–27)
Bilirubin Total: 0.7 mg/dL (ref 0.0–1.2)
CO2: 22 mmol/L (ref 20–29)
Calcium: 9.6 mg/dL (ref 8.7–10.3)
Chloride: 105 mmol/L (ref 96–106)
Creatinine, Ser: 0.81 mg/dL (ref 0.57–1.00)
Globulin, Total: 1.8 g/dL (ref 1.5–4.5)
Glucose: 94 mg/dL (ref 65–99)
Potassium: 4.8 mmol/L (ref 3.5–5.2)
Sodium: 141 mmol/L (ref 134–144)
Total Protein: 6.2 g/dL (ref 6.0–8.5)
eGFR: 80 mL/min/{1.73_m2} (ref >59)

## 2021-01-21 LAB — CBC WITH DIFFERENTIAL/PLATELET
Basophils Absolute: 0.1 10*3/uL (ref 0.0–0.2)
Basos: 1 %
EOS (ABSOLUTE): 0.1 10*3/uL (ref 0.0–0.4)
Eos: 2 %
Hematocrit: 41.7 % (ref 34.0–46.6)
Hemoglobin: 14.1 g/dL (ref 11.1–15.9)
Immature Grans (Abs): 0 10*3/uL (ref 0.0–0.1)
Immature Granulocytes: 0 %
Lymphocytes Absolute: 1.1 10*3/uL (ref 0.7–3.1)
Lymphs: 12 %
MCH: 31.6 pg (ref 26.6–33.0)
MCHC: 33.8 g/dL (ref 31.5–35.7)
MCV: 94 fL (ref 79–97)
Monocytes Absolute: 0.5 10*3/uL (ref 0.1–0.9)
Monocytes: 6 %
Neutrophils Absolute: 7.3 10*3/uL — ABNORMAL HIGH (ref 1.4–7.0)
Neutrophils: 79 %
Platelets: 271 10*3/uL (ref 150–450)
RBC: 4.46 x10E6/uL (ref 3.77–5.28)
RDW: 12 % (ref 11.7–15.4)
WBC: 9.2 10*3/uL (ref 3.4–10.8)

## 2021-01-21 LAB — LIPID PANEL
Chol/HDL Ratio: 2.6 ratio (ref 0.0–4.4)
Cholesterol, Total: 153 mg/dL (ref 100–199)
HDL: 60 mg/dL (ref >39)
LDL Chol Calc (NIH): 73 mg/dL (ref 0–99)
Triglycerides: 109 mg/dL (ref 0–149)
VLDL Cholesterol Cal: 20 mg/dL (ref 5–40)

## 2021-01-21 LAB — MAGNESIUM: Magnesium: 2.3 mg/dL (ref 1.6–2.3)

## 2021-01-21 LAB — TSH: TSH: 1.25 u[IU]/mL (ref 0.450–4.500)

## 2021-01-21 LAB — VITAMIN D 25 HYDROXY (VIT D DEFICIENCY, FRACTURES): Vit D, 25-Hydroxy: 114 ng/mL — ABNORMAL HIGH (ref 30.0–100.0)

## 2021-01-22 ENCOUNTER — Telehealth: Payer: Self-pay | Admitting: Internal Medicine

## 2021-01-22 NOTE — Telephone Encounter (Signed)
-----   Message from Chrystie Nose, MD sent at 01/22/2021 10:40 AM EDT ----- Regarding: RE: check on labs from PCP Labs look pretty good - no changes  Dr Rexene Edison ----- Message ----- From: Lindell Spar, RN Sent: 01/21/2021  10:59 AM EDT To: Chrystie Nose, MD Subject: FW: check on labs from PCP                     [facility]! She had labs with PCP yesterday - in epic  From your last note:  Would target her LDL to less than 70.  Unfortunately she could not tolerate atorvastatin which caused significant myalgias.  She is currently on rosuvastatin and I would recommend a repeat lipid in about 3 months.  If she is still not at target, would consider increasing the dose further possibly up to 20 mg daily which probably will help her reach her goal.  ----- Message ----- From: Lindell Spar, RN Sent: 12/12/2020  12:00 AM EDT To: Chrystie Nose, MD, Lindell Spar, RN Subject: check on labs from PCP

## 2021-01-22 NOTE — Telephone Encounter (Signed)
MyChart message sent to patient with MD notes below

## 2021-01-25 ENCOUNTER — Ambulatory Visit: Payer: Self-pay

## 2021-01-25 ENCOUNTER — Telehealth: Payer: Self-pay

## 2021-01-25 NOTE — Telephone Encounter (Signed)
Just FYI. Thanks!  

## 2021-01-25 NOTE — Telephone Encounter (Signed)
Copied from CRM [id number]. Topic: Appointment Scheduling - Scheduling Inquiry for Clinic >> Jan 25, 2021 12:25 PM Garrison Columbus, RN wrote: Reason for CRM: pt with intermittent chest heaviness on Saturday only. No other sx. Pt with no sx since Sunday.  Disposition was send to ED/ PCP triage.  Please have PCP review and make appt if appropriate

## 2021-01-25 NOTE — Telephone Encounter (Signed)
Pt called to report moderate chest heaviness Saturday and stopped on Sunday. Pt stated the episodes came and went and lasted approximately 5 minutes.  Pt denies any symptoms (heaviness, SOB, dizziness) today.  Pt stated she is back to her baseline. Disposition is ED/or PCP triage. Called office but closed for lunch.  Routing triage note and sent CRM for scheduling.   Care advice given and pt verbalized understanding.       Reason for Disposition  [1] Chest pain lasts > 5 minutes AND [2] occurred in past 3 days (72 hours) (Exception: feels exactly the same as previously diagnosed heartburn and has accompanying sour taste in mouth)  Answer Assessment - Initial Assessment Questions 1. NAME of MEDICATION: "What medicine are you calling about?"     amlodipine 2. QUESTION: "What is your question?" (e.g., double dose of medicine, side effect)     Not feeling well on Lisinopril-  pt stopped HCTZ and  started Lisinopril Thursday  3. PRESCRIBING HCP: "Who prescribed it?" Reason: if prescribed by specialist, call should be referred to that group.     Merita Norton FNP 4. SYMPTOMS: "Do you have any symptoms?"     Felt heaviness to chest- comes goes stopped yesterday. - feels tired today 5. SEVERITY: If symptoms are present, ask "Are they mild, moderate or severe?"     No sx today very mild tiredness today  BP 114/63 this am SBP ranging 106-136 and DBP 56-69.  Answer Assessment - Initial Assessment Questions 1. LOCATION: "Where does it hurt?"       Mid chest  2. RADIATION: "Does the pain go anywhere else?" (e.g., into neck, jaw, arms, back)     no 3. ONSET: "When did the chest pain begin?" (Minutes, hours or days)      Saturday and Sunday 4. PATTERN "Does the pain come and go, or has it been constant since it started?"  "Does it get worse with exertion?"      Heaviness come and go 5. DURATION: "How long does it last" (e.g., seconds, minutes, hours)     Saturday and Sunday 6. SEVERITY: "How  bad is the pain?"  (e.g., Scale 1-10; mild, moderate, or severe)    - MILD (1-3): doesn't interfere with normal activities     - MODERATE (4-7): interferes with normal activities or awakens from sleep    - SEVERE (8-10): excruciating pain, unable to do any normal activities       No pain 7. CARDIAC RISK FACTORS: "Do you have any history of heart problems or risk factors for heart disease?" (e.g., angina, prior heart attack; diabetes, high blood pressure, high cholesterol, smoker, or strong family history of heart disease)     HTN, family heart disease- smoker-high cholesterol 8. PULMONARY RISK FACTORS: "Do you have any history of lung disease?"  (e.g., blood clots in lung, asthma, emphysema, birth control pills)     no 9. CAUSE: "What do you think is causing the chest pain?"     Anxiety or the new BP med 10. OTHER SYMPTOMS: "Do you have any other symptoms?" (e.g., dizziness, nausea, vomiting, sweating, fever, difficulty breathing, cough)       No sx  Protocols used: Medication Question Call-A-AH, Chest Pain-A-AH

## 2021-01-26 ENCOUNTER — Encounter: Payer: Self-pay | Admitting: Family Medicine

## 2021-01-26 ENCOUNTER — Other Ambulatory Visit: Payer: Self-pay

## 2021-01-26 ENCOUNTER — Ambulatory Visit (INDEPENDENT_AMBULATORY_CARE_PROVIDER_SITE_OTHER): Payer: Medicare HMO | Admitting: Family Medicine

## 2021-01-26 ENCOUNTER — Telehealth: Payer: Self-pay | Admitting: Family Medicine

## 2021-01-26 VITALS — BP 146/59 | HR 73 | Temp 98.0°F | Wt 101.4 lb

## 2021-01-26 DIAGNOSIS — R002 Palpitations: Secondary | ICD-10-CM

## 2021-01-26 DIAGNOSIS — F418 Other specified anxiety disorders: Secondary | ICD-10-CM | POA: Insufficient documentation

## 2021-01-26 DIAGNOSIS — R69 Illness, unspecified: Secondary | ICD-10-CM | POA: Diagnosis not present

## 2021-01-26 DIAGNOSIS — I10 Essential (primary) hypertension: Secondary | ICD-10-CM

## 2021-01-26 DIAGNOSIS — E559 Vitamin D deficiency, unspecified: Secondary | ICD-10-CM | POA: Diagnosis not present

## 2021-01-26 DIAGNOSIS — G4700 Insomnia, unspecified: Secondary | ICD-10-CM | POA: Insufficient documentation

## 2021-01-26 DIAGNOSIS — R4589 Other symptoms and signs involving emotional state: Secondary | ICD-10-CM | POA: Insufficient documentation

## 2021-01-26 MED ORDER — HYDROXYZINE PAMOATE 25 MG PO CAPS: 25.0000 mg | ORAL_CAPSULE | Freq: Three times a day (TID) | ORAL | 0 refills | Status: DC | PRN

## 2021-01-26 MED ORDER — TRAZODONE HCL 50 MG PO TABS: 25.0000 mg | ORAL_TABLET | Freq: Every evening | ORAL | 0 refills | Status: DC | PRN

## 2021-01-26 NOTE — Progress Notes (Signed)
Established patient visit   Patient: Sierra Brown   DOB: [birth year]   70 y.o. Female  MRN: [id number] Visit Date: 01/26/2021  Today's healthcare provider: Jacky Kindle, FNP   No chief complaint on file.  Subjective    HPI Saturday started feeling a heaviness and her chest. Hypertension, follow-up  BP Readings from Last 3 Encounters:  01/26/21 (!) 146/59  01/20/21 (!) 157/51  10/08/20 132/76   Wt Readings from Last 3 Encounters:  01/26/21 101 lb 6.4 oz (46 kg)  01/20/21 103 lb 8 oz (46.9 kg)  10/08/20 102 lb 6.4 oz (46.4 kg)     She was last seen for hypertension 4 days ago.  BP at that visit was 157/51. Management since that visit includes begin lisinopril 5 mg qd  She reports good compliance with treatment. She is having side effects. Chest pain (heaviness) She is following a Regular, Low Sodium diet. She is not exercising. She does smoke.  Use of agents associated with hypertension: NSAIDS.   Outside blood pressures are as follows: Wednesday afternoon 134/58  Thursday AM 117/68 PM 114/59 Friday AM 117/58; afternoon 119/67; PM 112/57 Saturday AM 116/54; afternoon 130/69; PM 136/63 Sunday AM 106/56; afternoon 123/64; PM 127/64  Monday AM 143/63 PM 131/59 Symptoms: No chest pain Yes chest pressure  Yes palpitations No syncope  No dyspnea No orthopnea  No paroxysmal nocturnal dyspnea No lower extremity edema   Pertinent labs: Lab Results  Component Value Date   CHOL 153 01/20/2021   HDL 60 01/20/2021   LDLCALC 73 01/20/2021   TRIG 109 01/20/2021   CHOLHDL 2.6 01/20/2021   Lab Results  Component Value Date   NA 141 01/20/2021   K 4.8 01/20/2021   CREATININE 0.81 01/20/2021   GFRNONAA 81 08/05/2020   GFRAA 94 08/05/2020   GLUCOSE 94 01/20/2021     The 10-year ASCVD risk score (Goff DC Jr., et al., 2013) is: 17.5%   --------------------------------------------------------------------------------------------------- Chest tightness on  Saturday  Patient Active Problem List   Diagnosis Date Noted   Insomnia 01/26/2021   Anxiety about health 01/26/2021   Vitamin D deficiency 01/20/2021   B12 deficiency 01/20/2021   Hypertension 01/20/2021   Hyperlipidemia 01/20/2021   Coronary artery calcification seen on CT scan 09/16/2020   Dental infection 08/05/2020   Nausea 08/05/2020   Screening for blood or protein in urine 08/05/2020   Tick bite 08/05/2020   Palpitations 08/05/2020   Bruit of left carotid artery 08/05/2020   History of palpitations in adulthood 08/05/2020   Hematuria 08/05/2020   CERUMEN IMPACTION, BILATERAL 08/15/2008   URI 01/21/2008   SINUSITIS- ACUTE-NOS 08/30/2007   Tobacco abuse 05/25/2007   Allergic rhinitis 10/24/2006   Past Medical History:  Diagnosis Date   Allergy    allerlgic rhinitis   Cataract    COPD (chronic obstructive pulmonary disease) (HCC)    Pneumonia    Tobacco use disorder    Allergies  Allergen Reactions   Atorvastatin Other (See Comments)    Myalgias   Codeine     REACTION: nausea and vomiting Other reaction(s): Other (See Comments) REACTION: nausea and vomiting   Levofloxacin     REACTION: nausea Other reaction(s): Other (See Comments) REACTION: nausea   Sulfonamide Derivatives     REACTION: hives   Penicillins Rash    REACTION: hives Other reaction(s): Other (See Comments) REACTION: hives    Sulfa Antibiotics Rash    REACTION: hives  Medications: Outpatient Medications Prior to Visit  Medication Sig   aspirin EC 81 MG tablet Take 1 tablet (81 mg total) by mouth daily. Swallow whole.   Cyanocobalamin (B-12) 1000 MCG SUBL Place 1 tablet under the tongue daily.   fluticasone (FLONASE) 50 MCG/ACT nasal spray Place into the nose.   hydrochlorothiazide (HYDRODIURIL) 12.5 MG tablet TAKE 1 TABLET(12.5 MG) BY MOUTH DAILY   lisinopril (ZESTRIL) 5 MG tablet Take 1 tablet (5 mg total) by mouth daily.   Loratadine 10 MG CAPS Take by mouth.   omeprazole  (PRILOSEC) 20 MG capsule Take 1 capsule (20 mg total) by mouth daily.   rosuvastatin (CRESTOR) 5 MG tablet Take 1 tablet (5 mg total) by mouth daily.   [DISCONTINUED] Vitamin D, Ergocalciferol, (DRISDOL) 1.25 MG (50000 UNIT) CAPS capsule Take 1 capsule (50,000 Units total) by mouth every 7 (seven) days. (taking one tablet per week) keep follow up and check vitamin d level.   No facility-administered medications prior to visit.    Review of Systems  Respiratory:  Positive for chest tightness.   All other systems reviewed and are negative.  Last CBC Lab Results  Component Value Date   WBC 9.2 01/20/2021   HGB 14.1 01/20/2021   HCT 41.7 01/20/2021   MCV 94 01/20/2021   MCH 31.6 01/20/2021   RDW 12.0 01/20/2021   PLT 271 01/20/2021   Last metabolic panel Lab Results  Component Value Date   GLUCOSE 94 01/20/2021   NA 141 01/20/2021   K 4.8 01/20/2021   CL 105 01/20/2021   CO2 22 01/20/2021   BUN 9 01/20/2021   CREATININE 0.81 01/20/2021   GFRNONAA 81 08/05/2020   GFRAA 94 08/05/2020   CALCIUM 9.6 01/20/2021   PROT 6.2 01/20/2021   ALBUMIN 4.4 01/20/2021   LABGLOB 1.8 01/20/2021   AGRATIO 2.4 (H) 01/20/2021   BILITOT 0.7 01/20/2021   ALKPHOS 81 01/20/2021   AST 15 01/20/2021   ALT 10 01/20/2021   Last lipids Lab Results  Component Value Date   CHOL 153 01/20/2021   HDL 60 01/20/2021   LDLCALC 73 01/20/2021   TRIG 109 01/20/2021   CHOLHDL 2.6 01/20/2021    Last thyroid functions Lab Results  Component Value Date   TSH 1.250 01/20/2021   Last vitamin D Lab Results  Component Value Date   VD25OH 114.0 (H) 01/20/2021   Last vitamin B12 and Folate Lab Results  Component Value Date   VITAMINB12 337 08/05/2020   FOLATE >20.0 08/05/2020       Objective    BP (!) 146/59   Pulse 73   Temp 98 F (36.7 C) (Oral)   Wt 101 lb 6.4 oz (46 kg)   SpO2 99%   BMI 16.87 kg/m  BP Readings from Last 3 Encounters:  01/26/21 (!) 146/59  01/20/21 (!) 157/51   10/08/20 132/76   Wt Readings from Last 3 Encounters:  01/26/21 101 lb 6.4 oz (46 kg)  01/20/21 103 lb 8 oz (46.9 kg)  10/08/20 102 lb 6.4 oz (46.4 kg)       Physical Exam Constitutional:      General: She is not in acute distress.    Appearance: She is not ill-appearing, toxic-appearing or diaphoretic.  Cardiovascular:     Rate and Rhythm: Normal rate and regular rhythm.     Pulses: Normal pulses.     Heart sounds: Normal heart sounds. No murmur heard.   No friction rub. No gallop.  Pulmonary:  Effort: Pulmonary effort is normal.     Breath sounds: Normal breath sounds.  Abdominal:     General: Abdomen is flat.  Skin:    General: Skin is warm and dry.     Capillary Refill: Capillary refill takes 2 to 3 seconds.  Neurological:     General: No focal deficit present.     Mental Status: She is alert and oriented to person, place, and time. Mental status is at baseline.  Psychiatric:        Mood and Affect: Mood normal.     No results found for any visits on 01/26/21.  Assessment & Plan     Problem List Items Addressed This Visit       Cardiovascular and Mediastinum   Hypertension    BP has improved since starting ACEi  Reassurance provided  Reviewed home log  Stable today- if palpitations continue, could change to propanolol for palpitation and BP assistance         Other   Palpitations - Primary    Pt doing well off HCTZ  No complications with ACEi initiation  Continue medication regimen  Application of Ziopatch for 1 episode of palpitations which the patient described has heaviness  PRN hydroxyzine ordered to assist with anxiety as pt has been continually reviewing and thinking about changes to health over the last few years, especially after medication change at previous appt       Relevant Medications   hydrOXYzine (VISTARIL) 25 MG capsule   Other Relevant Orders   LONG TERM MONITOR (3-14 DAYS)   Vitamin D deficiency     Resolved  Discontinued Vit D supplement  Could re visit smaller, daily supplements if needed at later date       Insomnia    Restlessness when getting to bed, once asleep- able to sleep soundly  PRN trazodone provided   Will evaluate and follow up       Relevant Medications   traZODone (DESYREL) 50 MG tablet   Anxiety about health    Palpitations  New to health care- previously not taking medications  Reassurance provided over general health and how medications have assisted to prevent complications in relation to HTN and HLD  PRN hydroxyzine added; pt hesitant to start daily SSRI       Relevant Medications   traZODone (DESYREL) 50 MG tablet   hydrOXYzine (VISTARIL) 25 MG capsule     Return in about 1 month (around 02/26/2021) for blood pressure check.      Leilani Merl, FNP, have reviewed all documentation for this visit. The documentation on 01/26/21 for the exam, diagnosis, procedures, and orders are all accurate and complete.    Jacky Kindle, FNP  [facility] [phone] (phone) [phone] (fax)  [facility]

## 2021-01-26 NOTE — Telephone Encounter (Signed)
Medication has already been filled

## 2021-01-26 NOTE — Assessment & Plan Note (Signed)
Restlessness when getting to bed, once asleep- able to sleep soundly  PRN trazodone provided   Will evaluate and follow up

## 2021-01-26 NOTE — Assessment & Plan Note (Signed)
BP has improved since starting ACEi  Reassurance provided  Reviewed home log  Stable today- if palpitations continue, could change to propanolol for palpitation and BP assistance

## 2021-01-26 NOTE — Assessment & Plan Note (Signed)
Resolved  Discontinued Vit D supplement  Could re visit smaller, daily supplements if needed at later date

## 2021-01-26 NOTE — Assessment & Plan Note (Addendum)
Pt doing well off HCTZ  No complications with ACEi initiation  Continue medication regimen  Application of Ziopatch for 1 episode of palpitations which the patient described has heaviness  PRN hydroxyzine ordered to assist with anxiety as pt has been continually reviewing and thinking about changes to health over the last few years, especially after medication change at previous appt

## 2021-01-26 NOTE — Assessment & Plan Note (Signed)
Palpitations  New to health care- previously not taking medications  Reassurance provided over general health and how medications have assisted to prevent complications in relation to HTN and HLD  PRN hydroxyzine added; pt hesitant to start daily SSRI

## 2021-01-26 NOTE — Telephone Encounter (Signed)
[facility] faxed refill request for the following medications:  hydrOXYzine (VISTARIL) 25 MG capsule     Please advise.  

## 2021-01-27 NOTE — Telephone Encounter (Signed)
Tried calling patient, no answer. Unable to leave VM due to mailbox being full. Will try again later. CRM created.

## 2021-02-01 ENCOUNTER — Encounter: Payer: Self-pay | Admitting: Intensive Care

## 2021-02-01 ENCOUNTER — Other Ambulatory Visit: Payer: Self-pay

## 2021-02-01 DIAGNOSIS — N200 Calculus of kidney: Secondary | ICD-10-CM | POA: Diagnosis not present

## 2021-02-01 DIAGNOSIS — I10 Essential (primary) hypertension: Secondary | ICD-10-CM | POA: Diagnosis not present

## 2021-02-01 DIAGNOSIS — F17210 Nicotine dependence, cigarettes, uncomplicated: Secondary | ICD-10-CM | POA: Insufficient documentation

## 2021-02-01 DIAGNOSIS — Q7649 Other congenital malformations of spine, not associated with scoliosis: Secondary | ICD-10-CM | POA: Diagnosis not present

## 2021-02-01 DIAGNOSIS — Z79899 Other long term (current) drug therapy: Secondary | ICD-10-CM | POA: Insufficient documentation

## 2021-02-01 DIAGNOSIS — K7689 Other specified diseases of liver: Secondary | ICD-10-CM | POA: Diagnosis not present

## 2021-02-01 DIAGNOSIS — K5730 Diverticulosis of large intestine without perforation or abscess without bleeding: Secondary | ICD-10-CM | POA: Diagnosis not present

## 2021-02-01 DIAGNOSIS — R69 Illness, unspecified: Secondary | ICD-10-CM | POA: Diagnosis not present

## 2021-02-01 DIAGNOSIS — M160 Bilateral primary osteoarthritis of hip: Secondary | ICD-10-CM | POA: Diagnosis not present

## 2021-02-01 DIAGNOSIS — J449 Chronic obstructive pulmonary disease, unspecified: Secondary | ICD-10-CM | POA: Diagnosis not present

## 2021-02-01 DIAGNOSIS — Z7982 Long term (current) use of aspirin: Secondary | ICD-10-CM | POA: Diagnosis not present

## 2021-02-01 DIAGNOSIS — R109 Unspecified abdominal pain: Secondary | ICD-10-CM | POA: Diagnosis present

## 2021-02-01 LAB — CBC
HCT: 40.5 % (ref 36.0–46.0)
Hemoglobin: 14.3 g/dL (ref 12.0–15.0)
MCH: 33.4 pg (ref 26.0–34.0)
MCHC: 35.3 g/dL (ref 30.0–36.0)
MCV: 94.6 fL (ref 80.0–100.0)
Platelets: 254 10*3/uL (ref 150–400)
RBC: 4.28 MIL/uL (ref 3.87–5.11)
RDW: 12 % (ref 11.5–15.5)
WBC: 13.4 10*3/uL — ABNORMAL HIGH (ref 4.0–10.5)
nRBC: 0 % (ref 0.0–0.2)

## 2021-02-01 LAB — BASIC METABOLIC PANEL
Anion gap: 6 (ref 5–15)
BUN: 19 mg/dL (ref 8–23)
CO2: 27 mmol/L (ref 22–32)
Calcium: 9.5 mg/dL (ref 8.9–10.3)
Chloride: 103 mmol/L (ref 98–111)
Creatinine, Ser: 0.91 mg/dL (ref 0.44–1.00)
GFR, Estimated: >60 mL/min (ref >60)
Glucose, Bld: 129 mg/dL — ABNORMAL HIGH (ref 70–99)
Potassium: 4.4 mmol/L (ref 3.5–5.1)
Sodium: 136 mmol/L (ref 135–145)

## 2021-02-01 LAB — URINALYSIS, COMPLETE (UACMP) WITH MICROSCOPIC
Bacteria, UA: NONE SEEN
Bilirubin Urine: NEGATIVE
Glucose, UA: NEGATIVE mg/dL
Ketones, ur: NEGATIVE mg/dL
Leukocytes,Ua: NEGATIVE
Nitrite: NEGATIVE
Protein, ur: 30 mg/dL — AB
RBC / HPF: >50 RBC/hpf — ABNORMAL HIGH (ref 0–5)
Specific Gravity, Urine: 1.017 (ref 1.005–1.030)
Squamous Epithelial / HPF: NONE SEEN (ref 0–5)
pH: 5 (ref 5.0–8.0)

## 2021-02-01 NOTE — ED Triage Notes (Addendum)
Patient c/o right flank pain. Urinary frequency. Currently wearing a heart monitor for b/p issues per patient

## 2021-02-01 NOTE — Telephone Encounter (Signed)
Called and spoke with patient she states that she was seen in office on 01/26/21 and evaluated, patient reports that she is feeling better today. KW

## 2021-02-02 ENCOUNTER — Emergency Department: Payer: Medicare HMO

## 2021-02-02 ENCOUNTER — Emergency Department
Admission: EM | Admit: 2021-02-02 | Discharge: 2021-02-02 | Disposition: A | Discharge status: Home / Self Care | Payer: Medicare HMO | Attending: Emergency Medicine | Admitting: Emergency Medicine

## 2021-02-02 ENCOUNTER — Encounter: Payer: Self-pay | Admitting: Family Medicine

## 2021-02-02 DIAGNOSIS — K5730 Diverticulosis of large intestine without perforation or abscess without bleeding: Secondary | ICD-10-CM | POA: Diagnosis not present

## 2021-02-02 DIAGNOSIS — M160 Bilateral primary osteoarthritis of hip: Secondary | ICD-10-CM | POA: Diagnosis not present

## 2021-02-02 DIAGNOSIS — N200 Calculus of kidney: Secondary | ICD-10-CM | POA: Diagnosis not present

## 2021-02-02 DIAGNOSIS — Q7649 Other congenital malformations of spine, not associated with scoliosis: Secondary | ICD-10-CM | POA: Diagnosis not present

## 2021-02-02 DIAGNOSIS — K7689 Other specified diseases of liver: Secondary | ICD-10-CM | POA: Diagnosis not present

## 2021-02-02 MED ORDER — KETOROLAC TROMETHAMINE 10 MG PO TABS: 10.0000 mg | ORAL_TABLET | Freq: Four times a day (QID) | ORAL | 0 refills | Status: DC | PRN

## 2021-02-02 MED ORDER — ONDANSETRON HCL 4 MG/2ML IJ SOLN
4.0000 mg | Freq: Once | INTRAMUSCULAR | Status: AC
  Administered 2021-02-02: 4 mg via INTRAVENOUS
  Filled 2021-02-02: qty 2

## 2021-02-02 MED ORDER — SODIUM CHLORIDE 0.9 % IV BOLUS
1000.0000 mL | Freq: Once | INTRAVENOUS | Status: AC
  Administered 2021-02-02: 1000 mL via INTRAVENOUS

## 2021-02-02 MED ORDER — ONDANSETRON 4 MG PO TBDP
4.0000 mg | ORAL_TABLET | Freq: Once | ORAL | Status: AC | PRN
  Administered 2021-02-02: 4 mg via ORAL

## 2021-02-02 MED ORDER — ONDANSETRON 4 MG PO TBDP
ORAL_TABLET | ORAL | Status: AC
  Filled 2021-02-02: qty 1

## 2021-02-02 MED ORDER — ONDANSETRON 4 MG PO TBDP: 4.0000 mg | ORAL_TABLET | Freq: Three times a day (TID) | ORAL | 0 refills | Status: DC | PRN

## 2021-02-02 MED ORDER — KETOROLAC TROMETHAMINE 30 MG/ML IJ SOLN
15.0000 mg | Freq: Once | INTRAMUSCULAR | Status: AC
  Administered 2021-02-02: 15 mg via INTRAVENOUS
  Filled 2021-02-02: qty 1

## 2021-02-02 MED ORDER — FENTANYL CITRATE (PF) 100 MCG/2ML IJ SOLN
50.0000 ug | Freq: Once | INTRAMUSCULAR | Status: AC
Start: 2021-02-02 — End: 2021-02-02
  Administered 2021-02-02: 50 ug via INTRAVENOUS
  Filled 2021-02-02: qty 2

## 2021-02-02 NOTE — ED Notes (Signed)
Spoke with pt spouse on the phone-delay explained. Pt c/o nausea, prn zofran ordered. Pt ambulatory to restroom, additional warm blanket given.

## 2021-02-02 NOTE — ED Provider Notes (Signed)
[facility] Emergency Department Provider Note  Time seen: 2:18 AM  I have reviewed the triage vital signs and the nursing notes.   HISTORY  Chief Complaint Flank Pain   HPI Sierra Brown is a 70 y.o. female with a past medical history of COPD, hypertension, hyperlipidemia, presents to the emergency department for right flank pain.  According to the patient earlier today she developed acute onset of severe right flank pain.  Has not noted any dysuria hematuria or dark urine.  Denies any fever.  States the pain is currently a 12/10 sharp in nature.  States nausea but not vomiting.  No history of kidney stones.   Past Medical History:  Diagnosis Date   Allergy    allerlgic rhinitis   Cataract    COPD (chronic obstructive pulmonary disease) (HCC)    Pneumonia    Tobacco use disorder     Patient Active Problem List   Diagnosis Date Noted   Insomnia 01/26/2021   Anxiety about health 01/26/2021   Vitamin D deficiency 01/20/2021   B12 deficiency 01/20/2021   Hypertension 01/20/2021   Hyperlipidemia 01/20/2021   Coronary artery calcification seen on CT scan 09/16/2020   Dental infection 08/05/2020   Nausea 08/05/2020   Screening for blood or protein in urine 08/05/2020   Tick bite 08/05/2020   Palpitations 08/05/2020   Bruit of left carotid artery 08/05/2020   History of palpitations in adulthood 08/05/2020   Hematuria 08/05/2020   CERUMEN IMPACTION, BILATERAL 08/15/2008   URI 01/21/2008   SINUSITIS- ACUTE-NOS 08/30/2007   Tobacco abuse 05/25/2007   Allergic rhinitis 10/24/2006    Past Surgical History:  Procedure Laterality Date   CESAREAN SECTION     TONSILLECTOMY      Prior to Admission medications   Medication Sig Start Date End Date Taking? Authorizing Provider  aspirin EC 81 MG tablet Take 1 tablet (81 mg total) by mouth daily. Swallow whole. 08/05/20   Flinchum, Eula Fried, FNP  Cyanocobalamin (B-12) 1000 MCG SUBL Place 1 tablet under  the tongue daily. 08/06/20   Flinchum, Eula Fried, FNP  fluticasone (FLONASE) 50 MCG/ACT nasal spray Place into the nose.    [provider]  hydrochlorothiazide (HYDRODIURIL) 12.5 MG tablet TAKE 1 TABLET(12.5 MG) BY MOUTH DAILY 10/28/20   Maple Hudson., MD  hydrOXYzine (VISTARIL) 25 MG capsule Take 1 capsule (25 mg total) by mouth every 8 (eight) hours as needed for anxiety. 01/26/21   Jacky Kindle, FNP  lisinopril (ZESTRIL) 5 MG tablet Take 1 tablet (5 mg total) by mouth daily. 01/20/21   Jacky Kindle, FNP  Loratadine 10 MG CAPS Take by mouth.    [provider]  omeprazole (PRILOSEC) 20 MG capsule Take 1 capsule (20 mg total) by mouth daily. 08/05/20   Flinchum, Eula Fried, FNP  rosuvastatin (CRESTOR) 5 MG tablet Take 1 tablet (5 mg total) by mouth daily. 09/29/20   Erasmo Downer, MD  traZODone (DESYREL) 50 MG tablet Take 0.5-1 tablets (25-50 mg total) by mouth at bedtime as needed for sleep. 01/26/21   Jacky Kindle, FNP    Allergies  Allergen Reactions   Atorvastatin Other (See Comments)    Myalgias   Codeine     REACTION: nausea and vomiting Other reaction(s): Other (See Comments) REACTION: nausea and vomiting   Levofloxacin     REACTION: nausea Other reaction(s): Other (See Comments) REACTION: nausea   Sulfonamide Derivatives     REACTION: hives  Penicillins Rash    REACTION: hives Other reaction(s): Other (See Comments) REACTION: hives    Sulfa Antibiotics Rash    REACTION: hives    Family History  Problem Relation Age of Onset   Ulcerative colitis Mother        colostomy   Hypertension Mother    Heart Problems Mother    Breast cancer Neg Hx     Social History Social History   Tobacco Use   Smoking status: Every Day    Packs/day: 0.75    Years: 49.00    Pack years: 36.75    Types: Cigarettes   Smokeless tobacco: Never  Substance Use Topics   Alcohol use: Never   Drug use: Never    Review of Systems Constitutional: Negative  for fever. Cardiovascular: Negative for chest pain. Respiratory: Negative for shortness of breath. Gastrointestinal: Sharp severe right flank pain.  Positive for nausea. Genitourinary: Negative for urinary compaints Musculoskeletal: Negative for musculoskeletal complaints Neurological: Negative for headache All other ROS negative  ____________________________________________   PHYSICAL EXAM:  VITAL SIGNS: ED Triage Vitals  Enc Vitals Group     BP 02/01/21 1715 (!) 151/55     Pulse Rate 02/01/21 1715 78     Resp 02/01/21 1715 18     Temp 02/01/21 1715 (!) 97.5 F (36.4 C)     Temp src --      SpO2 02/01/21 1715 100 %     Weight 02/01/21 1834 105 lb (47.6 kg)     Height 02/01/21 1834 5\' 4"  (1.626 m)     Head Circumference --      Peak Flow --      Pain Score 02/01/21 1834 10     Pain Loc --      Pain Edu? --      Excl. in GC? --    Constitutional: Alert and oriented. Well appearing and in no distress. Eyes: Normal exam ENT      Head: Normocephalic and atraumatic.      Mouth/Throat: Mucous membranes are moist. Cardiovascular: Normal rate, regular rhythm.  Respiratory: Normal respiratory effort without tachypnea nor retractions. Breath sounds are clear  Gastrointestinal: Soft and nontender. No distention.  Musculoskeletal: Nontender with normal range of motion in all extremities. Neurologic:  Normal speech and language. No gross focal neurologic deficits  Skin:  Skin is warm, dry and intact.  Psychiatric: Mood and affect are normal.   ____________________________________________   RADIOLOGY  IMPRESSION:  Moderate right hydroureteronephrosis the level of a 2 mm calculus  just proximal to the right UVJ. Asymmetric right perinephric and  periureteral stranding and a small amount of right retroperitoneal  free fluid, favored to be reactive though a small forniceal rupture  cannot be entirely excluded.   Additional punctate nonobstructing calculi bilaterally.    Colonic diverticulosis without evidence of diverticulitis.   Scarring and architectural distortion with reticular interstitial  opacities and emphysematous changes in the lung bases, incompletely  assessed on this exam.   ____________________________________________   INITIAL IMPRESSION / ASSESSMENT AND PLAN / ED COURSE  Pertinent labs & imaging results that were available during my care of the patient were reviewed by me and considered in my medical decision making (see chart for details).   Patient presents to the emergency department for cute onset of sharp severe right flank pain.  Patient's labs show greater than 50 red blood cells in her urine, symptoms are very consistent with ureterolithiasis.  No history of kidney stones previously.  Remainder the lab work largely nonrevealing besides a slight leukocytosis.  No signs of urinary tract infection.  Urine culture has been added onto the patient's urinalysis.  We will obtain a CT renal scan to further evaluate.  Patient agreeable to plan of care.  Patient CT scan shows a 2 mm right UVJ stone with perinephric stranding.  Likely explaining the patient's discomfort.  Patient is feeling much better after medications.  We will discharge patient home with Toradol, urine strainer and Flomax.  We will have the patient follow-up with urology.  Discussed return precautions.  Patient agreeable to plan of care.  Sierra Brown was evaluated in Emergency Department on 02/02/2021 for the symptoms described in the history of present illness. She was evaluated in the context of the global COVID-19 pandemic, which necessitated consideration that the patient might be at risk for infection with the SARS-CoV-2 virus that causes COVID-19. Institutional protocols and algorithms that pertain to the evaluation of patients at risk for COVID-19 are in a state of rapid change based on information released by regulatory bodies including the CDC and federal and state  organizations. These policies and algorithms were followed during the patient's care in the ED.  ____________________________________________   FINAL CLINICAL IMPRESSION(S) / ED DIAGNOSES  Right flank pain Kidney stone   Minna Antis, MD 02/02/21 [phone]

## 2021-02-02 NOTE — Discharge Instructions (Addendum)
As we discussed please drink plenty of fluids.  Please take your medications as needed, as prescribed.  Please use your urine strainer.  Please call the number provided for urology to arrange a follow-up appointment.  Return to the emergency department for any painful urination, burning upon urination or development of fever.

## 2021-02-03 LAB — URINE CULTURE

## 2021-02-08 ENCOUNTER — Telehealth: Payer: Self-pay

## 2021-02-08 ENCOUNTER — Other Ambulatory Visit: Payer: Self-pay

## 2021-02-08 DIAGNOSIS — I10 Essential (primary) hypertension: Secondary | ICD-10-CM

## 2021-02-08 MED ORDER — LISINOPRIL 2.5 MG PO TABS: 5.0000 mg | ORAL_TABLET | Freq: Every day | ORAL | 3 refills | Status: DC

## 2021-02-08 NOTE — Telephone Encounter (Signed)
Spoke with patient on the phone who states that when she was seen in office 01/26/21 Sierra Brown had changed her prescription from HCTZ 2.5mg  to Lisinopril 5mg . Patient states that since changing medication she has been experiencing low blood pressure readings at home. Patient states that when her blood pressure drops she feels fatigues and states " I dont feel like doing anything." Patient states that blood pressure this morning was 98/57, patient would like to know if its okay to drop down to half tablet instead of whole since blood pressure has been low? Patient denies, chest pain, dyspnea, weakness, headache, nausea, vomiting, sweats, change in appetite. Please review and advise. KW

## 2021-02-08 NOTE — Telephone Encounter (Signed)
Yes. Ok to half tab of lisinopril (2.5mg  daily) and let us know how her BP is with that change

## 2021-02-08 NOTE — Telephone Encounter (Signed)
Copied from CRM [id number]. Topic: General - Inquiry >> Feb 08, 2021 10:06 AM Trula Slade wrote: Reason for CRM: Medication: BP meds was changed but it's keeping her BP lower and she can't function.  Please advise.

## 2021-02-08 NOTE — Telephone Encounter (Signed)
Patient advised andRx for 2.5 mg sent to pharmacy

## 2021-02-12 DIAGNOSIS — R002 Palpitations: Secondary | ICD-10-CM | POA: Diagnosis not present

## 2021-02-14 ENCOUNTER — Encounter (HOSPITAL_COMMUNITY): Payer: Self-pay

## 2021-02-14 ENCOUNTER — Observation Stay (HOSPITAL_COMMUNITY)
Admission: EM | Admit: 2021-02-14 | Discharge: 2021-02-15 | Disposition: A | Discharge status: Home / Self Care | Payer: Medicare HMO | Attending: Cardiology | Admitting: Cardiology

## 2021-02-14 ENCOUNTER — Other Ambulatory Visit: Payer: Self-pay

## 2021-02-14 ENCOUNTER — Emergency Department (HOSPITAL_COMMUNITY): Payer: Medicare HMO

## 2021-02-14 DIAGNOSIS — J449 Chronic obstructive pulmonary disease, unspecified: Secondary | ICD-10-CM | POA: Diagnosis not present

## 2021-02-14 DIAGNOSIS — Z7982 Long term (current) use of aspirin: Secondary | ICD-10-CM | POA: Diagnosis not present

## 2021-02-14 DIAGNOSIS — Z743 Need for continuous supervision: Secondary | ICD-10-CM | POA: Diagnosis not present

## 2021-02-14 DIAGNOSIS — R0789 Other chest pain: Principal | ICD-10-CM | POA: Insufficient documentation

## 2021-02-14 DIAGNOSIS — I10 Essential (primary) hypertension: Secondary | ICD-10-CM | POA: Diagnosis not present

## 2021-02-14 DIAGNOSIS — R079 Chest pain, unspecified: Secondary | ICD-10-CM | POA: Diagnosis present

## 2021-02-14 DIAGNOSIS — I2510 Atherosclerotic heart disease of native coronary artery without angina pectoris: Secondary | ICD-10-CM | POA: Diagnosis not present

## 2021-02-14 DIAGNOSIS — R69 Illness, unspecified: Secondary | ICD-10-CM | POA: Diagnosis not present

## 2021-02-14 DIAGNOSIS — Z720 Tobacco use: Secondary | ICD-10-CM | POA: Diagnosis not present

## 2021-02-14 DIAGNOSIS — F17210 Nicotine dependence, cigarettes, uncomplicated: Secondary | ICD-10-CM | POA: Diagnosis not present

## 2021-02-14 DIAGNOSIS — E785 Hyperlipidemia, unspecified: Secondary | ICD-10-CM | POA: Diagnosis present

## 2021-02-14 DIAGNOSIS — Z20822 Contact with and (suspected) exposure to covid-19: Secondary | ICD-10-CM | POA: Diagnosis not present

## 2021-02-14 DIAGNOSIS — Z79899 Other long term (current) drug therapy: Secondary | ICD-10-CM | POA: Insufficient documentation

## 2021-02-14 DIAGNOSIS — R072 Precordial pain: Secondary | ICD-10-CM

## 2021-02-14 LAB — CBC WITH DIFFERENTIAL/PLATELET
Abs Immature Granulocytes: 0.04 10*3/uL (ref 0.00–0.07)
Basophils Absolute: 0 10*3/uL (ref 0.0–0.1)
Basophils Relative: 1 %
Eosinophils Absolute: 0.2 10*3/uL (ref 0.0–0.5)
Eosinophils Relative: 3 %
HCT: 40.1 % (ref 36.0–46.0)
Hemoglobin: 13.8 g/dL (ref 12.0–15.0)
Immature Granulocytes: 1 %
Lymphocytes Relative: 19 %
Lymphs Abs: 1.4 10*3/uL (ref 0.7–4.0)
MCH: 32.6 pg (ref 26.0–34.0)
MCHC: 34.4 g/dL (ref 30.0–36.0)
MCV: 94.8 fL (ref 80.0–100.0)
Monocytes Absolute: 0.6 10*3/uL (ref 0.1–1.0)
Monocytes Relative: 8 %
Neutro Abs: 5.1 10*3/uL (ref 1.7–7.7)
Neutrophils Relative %: 68 %
Platelets: 291 10*3/uL (ref 150–400)
RBC: 4.23 MIL/uL (ref 3.87–5.11)
RDW: 12 % (ref 11.5–15.5)
WBC: 7.4 10*3/uL (ref 4.0–10.5)
nRBC: 0 % (ref 0.0–0.2)

## 2021-02-14 LAB — COMPREHENSIVE METABOLIC PANEL
ALT: 11 U/L (ref 0–44)
AST: 14 U/L — ABNORMAL LOW (ref 15–41)
Albumin: 3.5 g/dL (ref 3.5–5.0)
Alkaline Phosphatase: 51 U/L (ref 38–126)
Anion gap: 6 (ref 5–15)
BUN: 13 mg/dL (ref 8–23)
CO2: 24 mmol/L (ref 22–32)
Calcium: 9 mg/dL (ref 8.9–10.3)
Chloride: 109 mmol/L (ref 98–111)
Creatinine, Ser: 0.84 mg/dL (ref 0.44–1.00)
GFR, Estimated: >60 mL/min (ref >60)
Glucose, Bld: 99 mg/dL (ref 70–99)
Potassium: 4.1 mmol/L (ref 3.5–5.1)
Sodium: 139 mmol/L (ref 135–145)
Total Bilirubin: 0.8 mg/dL (ref 0.3–1.2)
Total Protein: 5.7 g/dL — ABNORMAL LOW (ref 6.5–8.1)

## 2021-02-14 LAB — CBC
HCT: 39.4 % (ref 36.0–46.0)
Hemoglobin: 13.4 g/dL (ref 12.0–15.0)
MCH: 32.4 pg (ref 26.0–34.0)
MCHC: 34 g/dL (ref 30.0–36.0)
MCV: 95.2 fL (ref 80.0–100.0)
Platelets: 289 10*3/uL (ref 150–400)
RBC: 4.14 MIL/uL (ref 3.87–5.11)
RDW: 12 % (ref 11.5–15.5)
WBC: 7.6 10*3/uL (ref 4.0–10.5)
nRBC: 0 % (ref 0.0–0.2)

## 2021-02-14 LAB — TROPONIN I (HIGH SENSITIVITY)
Troponin I (High Sensitivity): 5 ng/L (ref <18)
Troponin I (High Sensitivity): 6 ng/L (ref <18)

## 2021-02-14 LAB — CREATININE, SERUM
Creatinine, Ser: 0.87 mg/dL (ref 0.44–1.00)
GFR, Estimated: >60 mL/min (ref >60)

## 2021-02-14 LAB — HIV ANTIBODY (ROUTINE TESTING W REFLEX): HIV Screen 4th Generation wRfx: NONREACTIVE

## 2021-02-14 LAB — LIPASE, BLOOD: Lipase: 26 U/L (ref 11–51)

## 2021-02-14 LAB — SARS CORONAVIRUS 2 (TAT 6-24 HRS): SARS Coronavirus 2: NEGATIVE

## 2021-02-14 MED ORDER — LISINOPRIL 5 MG PO TABS
5.0000 mg | ORAL_TABLET | Freq: Every day | ORAL | Status: DC
  Administered 2021-02-15: 5 mg via ORAL
  Filled 2021-02-14: qty 1

## 2021-02-14 MED ORDER — ONDANSETRON HCL 4 MG/2ML IJ SOLN: 4.0000 mg | Freq: Four times a day (QID) | INTRAMUSCULAR | Status: DC | PRN

## 2021-02-14 MED ORDER — ASPIRIN 300 MG RE SUPP: 300.0000 mg | RECTAL | Status: AC

## 2021-02-14 MED ORDER — NITROGLYCERIN 0.4 MG SL SUBL: 0.4000 mg | SUBLINGUAL_TABLET | SUBLINGUAL | Status: DC | PRN

## 2021-02-14 MED ORDER — ASPIRIN 81 MG PO CHEW: 324.0000 mg | CHEWABLE_TABLET | ORAL | Status: AC

## 2021-02-14 MED ORDER — LISINOPRIL 10 MG PO TABS: 5.0000 mg | ORAL_TABLET | Freq: Every day | ORAL | Status: DC

## 2021-02-14 MED ORDER — ASPIRIN EC 81 MG PO TBEC: 81.0000 mg | DELAYED_RELEASE_TABLET | Freq: Every day | ORAL | Status: DC

## 2021-02-14 MED ORDER — HEPARIN SODIUM (PORCINE) 5000 UNIT/ML IJ SOLN
5000.0000 [IU] | Freq: Three times a day (TID) | INTRAMUSCULAR | Status: DC
  Administered 2021-02-14 – 2021-02-15 (×3): 5000 [IU] via SUBCUTANEOUS
  Filled 2021-02-14 (×3): qty 1

## 2021-02-14 MED ORDER — METOPROLOL TARTRATE 50 MG PO TABS
50.0000 mg | ORAL_TABLET | Freq: Once | ORAL | Status: AC
  Administered 2021-02-14: 50 mg via ORAL
  Filled 2021-02-14: qty 1

## 2021-02-14 MED ORDER — METOPROLOL TARTRATE 50 MG PO TABS
50.0000 mg | ORAL_TABLET | ORAL | Status: DC
  Filled 2021-02-14: qty 1

## 2021-02-14 MED ORDER — ASPIRIN EC 81 MG PO TBEC
81.0000 mg | DELAYED_RELEASE_TABLET | Freq: Every day | ORAL | Status: DC
  Administered 2021-02-15: 81 mg via ORAL
  Filled 2021-02-14: qty 1

## 2021-02-14 MED ORDER — ACETAMINOPHEN 325 MG PO TABS
650.0000 mg | ORAL_TABLET | ORAL | Status: DC | PRN
Start: 2021-02-14 — End: 2021-02-15

## 2021-02-14 NOTE — ED Notes (Addendum)
Pt ambulated to restroom independently, returned to room and this RN reapplied monitoring devices. No distress or pain reported at this time.

## 2021-02-14 NOTE — H&P (Addendum)
Cardiology Consultation:   Patient ID: Sierra Brown; [id number]; 09-11-1950   Admit date: 02/14/2021 Date of Consult: 02/14/2021  Primary Care Provider: Jacky KindlePayne, Elise T, FNP Primary Cardiologist: Dr. Rennis GoldenHilty, MD   Patient Profile:   Sierra PiggVicky L Brown is a 70 y.o. female with a hx of carotid artery disease, COPD, HTN, palpitations, ongoing tobacco use and a family hx of CAD who is being seen today for the evaluation of chest pain at the request of Dr. Donnald GarrePfeiffer.   History of Present Illness:   Sierra Brown is a 70yo F with a hx as stated above who presented to the ED 02/14/21 with intermittent, worsening chest pain. She states that she was in her usual state of health until several weeks ago when she was followed by her PCP for dizziness/orthostatic hypotension felt to be related to her antihypertensive medication. She wore a ZIO monitor and mailed this back last week with pending results. She was transitioned to lisinopril from HCTZ with some improvement. Since that time, she reports intermittent, anterior chest pressure with radiation to her left arm/hand and up into her left jaw and face region. She describes the pain as a dull pressure and arm and jaw discomfort as a tingling sensation. Symptoms are occurring at random and are not necessarily with exertion. Episodes occurring at least several times per week and lasting 10-15 minutes in duration. Symptoms will resolve spontaneously. She denies SOB, orthopnea, LE edema, PND, palpitations, recent illness with fever, chills or cough. Early this morning at approximately 3AM, she woke to use the restroom when her symptoms started however were more intense. She states that she went back to sleep for a few hours in hopes that her symptoms would resolve. Upon waking again at around 8AM, her symptoms were still present. Given this, she called EMS for transport to the ED for further evaluation.   In the ED, EKG with NSR and non-specific TW abnormalities  however no acute changes. HsT found to be normal at 5. All other labs appear to be unremarkable. 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   She was referred to Dr. Rennis GoldenHilty 08/24/20 from her PCP after physical exam positive for new carotid bruit. On evaluation, Dr. Rennis GoldenHilty was unable to hear the bruit himself. Plan at that time was to pursue a carotid doppler study. This was performed 09/07/20 which showed minimal, non specific carotid disease. ZIO monitor was ordered however it appears this was never worn. She was seen once again 10/08/20. She had recently undergone chest CT imaging for tobacco use which did show multivessel coronary calcifications and aortic atherosclerosis. Based on findings, her statin was intensified.   Past Medical History:  Diagnosis Date   Allergy    allerlgic rhinitis   Cataract    COPD (chronic obstructive pulmonary disease) (HCC)    Pneumonia    Tobacco use disorder     Past Surgical History:  Procedure Laterality Date   CESAREAN SECTION     TONSILLECTOMY       Prior to Admission medications   Medication Sig Start Date End Date Taking? Authorizing Provider  aspirin EC 81 MG tablet Take 1 tablet (81 mg total) by mouth daily. Swallow whole. 08/05/20   Flinchum, Eula FriedMichelle S, FNP  Cyanocobalamin (B-12) 1000 MCG SUBL Place 1 tablet under the tongue daily. 08/06/20   Flinchum, Eula FriedMichelle S, FNP  fluticasone (FLONASE) 50 MCG/ACT nasal spray Place into the nose.    [provider]  hydrochlorothiazide (HYDRODIURIL) 12.5  MG tablet TAKE 1 TABLET(12.5 MG) BY MOUTH DAILY 10/28/20   Maple Hudson., MD  hydrOXYzine (VISTARIL) 25 MG capsule Take 1 capsule (25 mg total) by mouth every 8 (eight) hours as needed for anxiety. 01/26/21   Jacky Kindle, FNP  ketorolac (TORADOL) 10 MG tablet Take 1 tablet (10 mg total) by mouth every 6 (six) hours as needed. 02/02/21   Minna Antis, MD  lisinopril (ZESTRIL) 2.5 MG tablet Take 2 tablets (5 mg total) by mouth  daily. 02/08/21   Bacigalupo, Marzella Schlein, MD  Loratadine 10 MG CAPS Take by mouth.    [provider]  omeprazole (PRILOSEC) 20 MG capsule Take 1 capsule (20 mg total) by mouth daily. 08/05/20   Flinchum, Eula Fried, FNP  ondansetron (ZOFRAN ODT) 4 MG disintegrating tablet Take 1 tablet (4 mg total) by mouth every 8 (eight) hours as needed for nausea or vomiting. 02/02/21   Minna Antis, MD  rosuvastatin (CRESTOR) 5 MG tablet Take 1 tablet (5 mg total) by mouth daily. 09/29/20   Erasmo Downer, MD  traZODone (DESYREL) 50 MG tablet Take 0.5-1 tablets (25-50 mg total) by mouth at bedtime as needed for sleep. 01/26/21   Jacky Kindle, FNP    Inpatient Medications: Scheduled Meds:  Continuous Infusions:  PRN Meds:   Allergies:    Allergies  Allergen Reactions   Atorvastatin Other (See Comments)    Myalgias   Codeine     REACTION: nausea and vomiting Other reaction(s): Other (See Comments) REACTION: nausea and vomiting   Levofloxacin     REACTION: nausea Other reaction(s): Other (See Comments) REACTION: nausea   Sulfonamide Derivatives     REACTION: hives   Penicillins Rash    REACTION: hives Other reaction(s): Other (See Comments) REACTION: hives    Sulfa Antibiotics Rash    REACTION: hives    Social History:   Social History   Socioeconomic History   Marital status: Married    Spouse name: Not on file   Number of children: Not on file   Years of education: Not on file   Highest education level: Not on file  Occupational History   Not on file  Tobacco Use   Smoking status: Every Day    Packs/day: 0.75    Years: 49.00    Pack years: 36.75    Types: Cigarettes   Smokeless tobacco: Never  Substance and Sexual Activity   Alcohol use: Never   Drug use: Never   Sexual activity: Not on file  Other Topics Concern   Not on file  Social History Narrative   Not on file   Social Determinants of Health   Financial Resource Strain: Not on file  Food  Insecurity: Not on file  Transportation Needs: Not on file  Physical Activity: Not on file  Stress: Not on file  Social Connections: Not on file  Intimate Partner Violence: Not on file    Family History:   Family History  Problem Relation Age of Onset   Ulcerative colitis Mother        colostomy   Hypertension Mother    Heart Problems Mother    Breast cancer Neg Hx    Family Status:  Family Status  Relation Name Status   Mother  (Not Specified)       colostomy   Neg Hx  (Not Specified)    ROS:  Please see the history of present illness.  All other ROS reviewed and negative.  Physical Exam/Data:   Vitals:   02/14/21 0955 02/14/21 1000 02/14/21 1030  BP: (!) 163/67  (!) 154/62  Pulse: 81  78  Resp: 16  17  Temp: 98.2 F (36.8 C)    TempSrc: Oral    SpO2: 98% 99% 98%   No intake or output data in the 24 hours ending 02/14/21 1121 There were no vitals filed for this visit. There is no height or weight on file to calculate BMI.   General: Well developed, well nourished, NAD Neck: Negative for carotid bruits. No JVD Lungs:Clear to ausculation bilaterally. No wheezes, rales, or rhonchi. Breathing is unlabored. Cardiovascular: RRR with S1 S2. No murmurs.  Abdomen: Soft, non-tender, non-distended. No obvious abdominal masses. Extremities: No edema. Radial pulses 2+ bilaterally Neuro: Alert and oriented. No focal deficits. No facial asymmetry. MAE spontaneously. Psych: Responds to questions appropriately with normal affect.    EKG:  The EKG was personally reviewed and demonstrates:  02/14/21 NSR with HR 69bpm and no ST/TW changes  Telemetry:  Telemetry was personally reviewed and demonstrates: 02/14/21 NSR with HR in the 70's   Relevant CV Studies:  Doppler study 09/07/20:  Summary:  Right Carotid: Velocities in the right ICA are consistent with a 1-39%  stenosis.   Left Carotid: There is no evidence of stenosis in the left ICA.                 Non-hemodynamically significant plaque <50% noted in the  CCA. The                extracranial vessels were near-normal with only minimal wall                thickening or plaque.   Vertebrals:  Left vertebral artery demonstrates antegrade flow. Right  vertebral               artery demonstrates an occlusion.  Subclavians: Right subclavian artery flow was disturbed. Normal flow               hemodynamics were seen in the left subclavian artery.   Laboratory Data:  ChemistryNo results for input(s): NA, K, CL, CO2, GLUCOSE, BUN, CREATININE, CALCIUM, GFRNONAA, GFRAA, ANIONGAP in the last 168 hours.  Total Protein  Date Value Ref Range Status  01/20/2021 6.2 6.0 - 8.5 g/dL Final   Albumin  Date Value Ref Range Status  01/20/2021 4.4 3.8 - 4.8 g/dL Final   AST  Date Value Ref Range Status  01/20/2021 15 0 - 40 IU/L Final   ALT  Date Value Ref Range Status  01/20/2021 10 0 - 32 IU/L Final   Alkaline Phosphatase  Date Value Ref Range Status  01/20/2021 81 44 - 121 IU/L Final   Bilirubin Total  Date Value Ref Range Status  01/20/2021 0.7 0.0 - 1.2 mg/dL Final   Hematology Recent Labs  Lab 02/14/21 1022  WBC 7.4  RBC 4.23  HGB 13.8  HCT 40.1  MCV 94.8  MCH 32.6  MCHC 34.4  RDW 12.0  PLT 291   Cardiac EnzymesNo results for input(s): TROPONINI in the last 168 hours. No results for input(s): TROPIPOC in the last 168 hours.  BNPNo results for input(s): BNP, PROBNP in the last 168 hours.  DDimer No results for input(s): DDIMER in the last 168 hours. TSH:  Lab Results  Component Value Date   TSH 1.250 01/20/2021   Lipids: Lab Results  Component Value Date   CHOL 153 01/20/2021  HDL 60 01/20/2021   LDLCALC 73 01/20/2021   TRIG 109 01/20/2021   CHOLHDL 2.6 01/20/2021   HgbA1c:No results found for: HGBA1C  Radiology/Studies:  No results found.  Assessment and Plan:   1. Chest pain: -Pt presented to the ED 02/14/21 with intermittent, worsening chest pain. She  states that she was in her usual state of health until several weeks ago when she was followed by her PCP for dizziness/orthostatic hypotension felt to be related to her antihypertensive medication. She was transitioned to lisinopril from HCTZ with some improvement. Since that time, she reports intermittent, anterior chest pressure with radiation to her left arm/hand and up into her left jaw and face region. She describes the pain as a dull pressure and arm and jaw discomfort as a tingling sensation. Symptoms are occurring at random and are not necessarily with exertion. Episodes occurring at least several times per week and lasting 10-15 minutes in duration. Symptoms will resolve spontaneously. She denies SOB, orthopnea, LE edema, PND, palpitations, recent illness with fever, chills or cough. Early this morning at approximately 3AM, she woke to use the restroom when her symptoms started however were more intense. She states that she went back to sleep for a few hours in hopes that her symptoms would resolve. Upon waking again at around 8AM, her symptoms were still present. -EKG with NSR and non-specific TW abnormalities however no acute changes.  -HsT found to be normal at 5. All other labs appear to be unremarkable.  -CXR with no active disease with advanced COPD/emphysema.  -Will plan to trend HsT -Would recommend coronary CTA due to persistent anginal symptoms, CV risk facors including tobacco use, family hx of CAD, HTN, and HLD. Also found to have coronary calcifications and aortic atherosclerosis on prior chest CT imagining.  -Continue home ASA  2. Minimal carotid artery disease: -Referred to Dr. Rennis Golden 08/24/20 from her PCP after physical exam positive for new carotid bruit. Carotid doppler study 09/07/20 showed minimal, non specific carotid disease.  -Chest CT imaging for tobacco use which did show coronary calcifications and aortic atherosclerosis.    3. Tobacco use with COPD: -Continues to smoke  with no current plans for cessation despite encouragement   4. HTN: -Recent transition from HCTZ to lisinopril with improvement in dizziness symptoms -BP stable today at 130/55>>125/59>>154/62  5. HLD: -On home Crestor>>continue  -Last LDL, 73  6. Hx of palpitations/dizziness: -Seen by PCP recently with antihypertensive med changes and ZIO montior that was mailed back last week with pending results.  -No arrhthymias on telemetry thus far>>will continue to monitor    For questions or updates, please contact [facility] Please consult [email or web address] for contact info under Cardiology/STEMI.   Raliegh Ip NP-[facility] Pager: [phone] 02/14/2021 11:21 AM  Patient seen and examined.  Agree with above documentation.  Sierra Brown is a 70 year old female with a history of COPD, carotid artery disease, hypertension, tobacco use who is being seen today for chest pain.  She reports that over the last few weeks she has had chest pain.  Describes as pressure in the center of her chest that lasts 5 to 10 minutes and radiates to left arm.  Has been occurring at rest.  Has been happening about every other day.  This morning she woke up during the night with the same chest pain.  Lasted 5 to 10 minutes and resolved, but given episode, decided to come to the ED.  She also has been having issues with dizziness/orthostatic hypotension  after recently starting HCTZ then lisinopril.  In the ED, initial vital signs notable for BP 163/67, SPO2 98% on room air, pulse 81.  Labs notable for creatinine 0.84, hemoglobin 13.8, platelets 291, WBC 7.4, troponin 5 > 8.  Chest x-ray shows no acute cardiopulmonary disease but advanced emphysema.  EKG shows sinus rhythm, rate 69, Q waves in V1/2 and aVL. On exam, patient is alert and oriented, regular rate and rhythm, no murmurs, lungs CTAB, no LE edema or JVD.  Telemetry shows sinus rhythm with rate 60s to 70s.  For her chest pain, description is atypical but  does have significant CAD risk factors.  Also with prior CT chest with mild coronary calcification, in LAD.  Recommend further evaluation with coronary CTA.  Will give metoprolol 50 mg tonight and again tomorrow morning in preparation for coronary CTA tomorrow.  Will check echo.  Little Ishikawa, MD

## 2021-02-14 NOTE — ED Triage Notes (Signed)
Pt BIB [facility] from home c/o CP that started at 3am this morning and radiates to her left arm. Pt was given 324 of ASA and states she is now pain free. Pt has also had some indigestion over the last few days but stopped taking her meds for that.

## 2021-02-14 NOTE — ED Provider Notes (Signed)
[facility] EMERGENCY DEPARTMENT Provider Note   CSN: [id number] Arrival date & time: 02/14/21  8657     History Chief Complaint  Patient presents with   Chest Pain    Sierra Brown is a 70 y.o. female.  HPI Patient reports that she has been getting chest pain for about the past 2 to 3 weeks.  Reports that the heaviness on her chest.  She also then sometimes gets an aching quality into the left arm.  Today it occurred at 3 AM in the morning.  She reports it was persisting so she called EMS.  She reports she was given aspirin and that helped and at this point time she is pain-free.  She reports over the last about 3 weeks she has had 2 rest more when doing her usual outdoor chores.  Reports sometimes she is got an aching in her left arm like she has a blood pressure cuff on it.  The past day or 2 she has intermittently gotten lightheaded and had to sit down and rest for period and then feels better.  Patient has onset of hypertension within the last year.  Reports he does take her medications.  She has a long history of tobacco use.  No known cardiac disease.  She was seen by Dr. Rennis Golden and describes having carotid ultrasound but denies history of stress test or catheterization.    Past Medical History:  Diagnosis Date   Allergy    allerlgic rhinitis   Cataract    COPD (chronic obstructive pulmonary disease) (HCC)    Pneumonia    Tobacco use disorder     Patient Active Problem List   Diagnosis Date Noted   Chest pain 02/14/2021   Insomnia 01/26/2021   Anxiety about health 01/26/2021   Vitamin D deficiency 01/20/2021   B12 deficiency 01/20/2021   Hypertension 01/20/2021   Hyperlipidemia 01/20/2021   Coronary artery calcification seen on CT scan 09/16/2020   Dental infection 08/05/2020   Nausea 08/05/2020   Screening for blood or protein in urine 08/05/2020   Tick bite 08/05/2020   Palpitations 08/05/2020   Bruit of left carotid artery 08/05/2020    History of palpitations in adulthood 08/05/2020   Hematuria 08/05/2020   CERUMEN IMPACTION, BILATERAL 08/15/2008   URI 01/21/2008   SINUSITIS- ACUTE-NOS 08/30/2007   Tobacco abuse 05/25/2007   Allergic rhinitis 10/24/2006    Past Surgical History:  Procedure Laterality Date   CESAREAN SECTION     TONSILLECTOMY       OB History   No obstetric history on file.     Family History  Problem Relation Age of Onset   Ulcerative colitis Mother        colostomy   Hypertension Mother    Heart Problems Mother    Breast cancer Neg Hx     Social History   Tobacco Use   Smoking status: Every Day    Packs/day: 0.75    Years: 49.00    Pack years: 36.75    Types: Cigarettes   Smokeless tobacco: Never  Substance Use Topics   Alcohol use: Never   Drug use: Never    Home Medications Prior to Admission medications   Medication Sig Start Date End Date Taking? Authorizing Provider  acetaminophen (TYLENOL) 500 MG tablet Take 500-1,000 mg by mouth every 6 (six) hours as needed for headache (pain).   Yes [provider]  aspirin EC 81 MG tablet Take 1 tablet (81 mg total)  by mouth daily. Swallow whole. 08/05/20  Yes Flinchum, Eula Fried, FNP  fluticasone (FLONASE) 50 MCG/ACT nasal spray Place 1 spray into the nose daily as needed for allergies or rhinitis (seasonal allergies).   Yes [provider]  hydrOXYzine (VISTARIL) 25 MG capsule Take 1 capsule (25 mg total) by mouth every 8 (eight) hours as needed for anxiety. 01/26/21  Yes Merita Norton T, FNP  lisinopril (ZESTRIL) 2.5 MG tablet Take 2 tablets (5 mg total) by mouth daily. 02/08/21  Yes Bacigalupo, Marzella Schlein, MD  Loratadine 10 MG CAPS Take 10 mg by mouth daily as needed (seasonal allergies).   Yes [provider]  omeprazole (PRILOSEC OTC) 20 MG tablet Take 20 mg by mouth every morning.   Yes [provider]  rosuvastatin (CRESTOR) 5 MG tablet Take 1 tablet (5 mg total) by mouth daily. 09/29/20  Yes  Bacigalupo, Marzella Schlein, MD  traZODone (DESYREL) 50 MG tablet Take 0.5-1 tablets (25-50 mg total) by mouth at bedtime as needed for sleep. 01/26/21  Yes Jacky Kindle, FNP  Cyanocobalamin (B-12) 1000 MCG SUBL Place 1 tablet under the tongue daily. Patient not taking: No sig reported 08/06/20   Flinchum, Eula Fried, FNP  hydrochlorothiazide (HYDRODIURIL) 12.5 MG tablet TAKE 1 TABLET(12.5 MG) BY MOUTH DAILY Patient not taking: No sig reported 10/28/20   Maple Hudson., MD  ketorolac (TORADOL) 10 MG tablet Take 1 tablet (10 mg total) by mouth every 6 (six) hours as needed. Patient not taking: No sig reported 02/02/21   Minna Antis, MD  omeprazole (PRILOSEC) 20 MG capsule Take 1 capsule (20 mg total) by mouth daily. Patient not taking: Reported on 02/14/2021 08/05/20   Flinchum, Eula Fried, FNP  ondansetron (ZOFRAN ODT) 4 MG disintegrating tablet Take 1 tablet (4 mg total) by mouth every 8 (eight) hours as needed for nausea or vomiting. Patient not taking: No sig reported 02/02/21   Minna Antis, MD    Allergies    Atorvastatin, Codeine, Levofloxacin, Sulfonamide derivatives, Penicillins, and Sulfa antibiotics  Review of Systems   Review of Systems 10 systems reviewed and negative except per HPI Physical Exam Updated Vital Signs BP (!) 130/55   Pulse 66   Temp 98.2 F (36.8 C) (Oral)   Resp 18   SpO2 98%   Physical Exam Constitutional:      Appearance: Normal appearance.  HENT:     Mouth/Throat:     Pharynx: Oropharynx is clear.  Eyes:     Extraocular Movements: Extraocular movements intact.  Cardiovascular:     Rate and Rhythm: Normal rate and regular rhythm.  Pulmonary:     Effort: Pulmonary effort is normal.     Comments: Breath sounds are symmetric.  Breath sounds soft consistent with COPD.  No active wheeze or crackle. Abdominal:     General: There is no distension.     Palpations: Abdomen is soft.     Tenderness: There is no abdominal tenderness. There is no  guarding.  Musculoskeletal:        General: No swelling or tenderness. Normal range of motion.     Right lower leg: No edema.     Left lower leg: No edema.  Skin:    General: Skin is warm and dry.  Neurological:     General: No focal deficit present.     Mental Status: She is alert and oriented to person, place, and time.     Coordination: Coordination normal.  Psychiatric:  Mood and Affect: Mood normal.    ED Results / Procedures / Treatments   Labs (all labs ordered are listed, but only abnormal results are displayed) Labs Reviewed  COMPREHENSIVE METABOLIC PANEL - Abnormal; Notable for the following components:      Result Value   Total Protein 5.7 (*)    AST 14 (*)    All other components within normal limits  LIPASE, BLOOD  CBC WITH DIFFERENTIAL/PLATELET  HIV ANTIBODY (ROUTINE TESTING W REFLEX)  CBC  CREATININE, SERUM  TROPONIN I (HIGH SENSITIVITY)  TROPONIN I (HIGH SENSITIVITY)    EKG EKG Interpretation  Date/Time:  Sunday February 14 2021 09:54:50 EDT Ventricular Rate:  69 PR Interval:    QRS Duration: 74 QT Interval:  391 QTC Calculation: 419 R Axis:   91 Text Interpretation: Sinus rhythm Anteroseptal infarct, age indeterminate Confirmed by Arby Barrette [phone]) on 02/14/2021 10:16:32 AM  Radiology DG Chest Port 1 View  Result Date: 02/14/2021 CLINICAL DATA:  Chest pain radiating to the left arm beginning early this morning. EXAM: PORTABLE CHEST 1 VIEW COMPARISON:  04/14/2013 FINDINGS: Cardiac silhouette is normal in size. No mediastinal or hilar masses. Lungs are hyperexpanded. There are thickened interstitial markings most evident at the bases. No evidence of pneumonia or pulmonary edema. No pleural effusion or pneumothorax. Skeletal structures are grossly intact. IMPRESSION: 1. No acute cardiopulmonary disease. 2. Advanced COPD/emphysema. Electronically Signed   By: Amie Portland M.D.   On: 02/14/2021 11:27    Procedures Procedures   Medications  Ordered in ED Medications  aspirin chewable tablet 324 mg (324 mg Oral Not Given 02/14/21 1330)    Or  aspirin suppository 300 mg ( Rectal See Alternative 02/14/21 1330)  aspirin EC tablet 81 mg (has no administration in time range)  nitroGLYCERIN (NITROSTAT) SL tablet 0.4 mg (has no administration in time range)  acetaminophen (TYLENOL) tablet 650 mg (has no administration in time range)  ondansetron (ZOFRAN) injection 4 mg (has no administration in time range)  heparin injection 5,000 Units (has no administration in time range)  metoprolol tartrate (LOPRESSOR) tablet 50 mg (has no administration in time range)  metoprolol tartrate (LOPRESSOR) tablet 50 mg (has no administration in time range)  lisinopril (ZESTRIL) tablet 5 mg (has no administration in time range)    ED Course  I have reviewed the triage vital signs and the nursing notes.  Pertinent labs & imaging results that were available during my care of the patient were reviewed by me and considered in my medical decision making (see chart for details).    MDM Rules/Calculators/A&P                           Patient presents with symptoms concerning for angina.  Patient has multiple risk factors.  She did have coronary artery CT with multivessel disease.  She is pain-free at this time.  Will consult cardiology for further evaluation.  EKG unchanged from previous.  Cardiology will admit for further evaluation. Final Clinical Impression(s) / ED Diagnoses Final diagnoses:  Precordial chest pain    Rx / DC Orders ED Discharge Orders     None        Arby Barrette, MD 02/14/21 1450

## 2021-02-15 ENCOUNTER — Observation Stay (HOSPITAL_COMMUNITY): Payer: Medicare HMO

## 2021-02-15 ENCOUNTER — Observation Stay (HOSPITAL_BASED_OUTPATIENT_CLINIC_OR_DEPARTMENT_OTHER): Payer: Medicare HMO

## 2021-02-15 DIAGNOSIS — R079 Chest pain, unspecified: Secondary | ICD-10-CM

## 2021-02-15 DIAGNOSIS — R072 Precordial pain: Secondary | ICD-10-CM | POA: Diagnosis not present

## 2021-02-15 LAB — ECHOCARDIOGRAM COMPLETE
Area-P 1/2: 3.72 cm2
Calc EF: 64.7 %
Height: 64 in
S' Lateral: 2.5 cm
Single Plane A2C EF: 64.9 %
Single Plane A4C EF: 62.1 %
Weight: 1582.4 oz

## 2021-02-15 LAB — BASIC METABOLIC PANEL
Anion gap: 8 (ref 5–15)
BUN: 17 mg/dL (ref 8–23)
CO2: 24 mmol/L (ref 22–32)
Calcium: 9.1 mg/dL (ref 8.9–10.3)
Chloride: 106 mmol/L (ref 98–111)
Creatinine, Ser: 0.83 mg/dL (ref 0.44–1.00)
GFR, Estimated: >60 mL/min (ref >60)
Glucose, Bld: 91 mg/dL (ref 70–99)
Potassium: 4.2 mmol/L (ref 3.5–5.1)
Sodium: 138 mmol/L (ref 135–145)

## 2021-02-15 LAB — CBC
HCT: 37.7 % (ref 36.0–46.0)
Hemoglobin: 12.8 g/dL (ref 12.0–15.0)
MCH: 32.2 pg (ref 26.0–34.0)
MCHC: 34 g/dL (ref 30.0–36.0)
MCV: 95 fL (ref 80.0–100.0)
Platelets: 270 10*3/uL (ref 150–400)
RBC: 3.97 MIL/uL (ref 3.87–5.11)
RDW: 12.2 % (ref 11.5–15.5)
WBC: 8.9 10*3/uL (ref 4.0–10.5)
nRBC: 0 % (ref 0.0–0.2)

## 2021-02-15 MED ORDER — METOPROLOL TARTRATE 5 MG/5ML IV SOLN
INTRAVENOUS | Status: AC
  Filled 2021-02-15: qty 5

## 2021-02-15 MED ORDER — IOHEXOL 350 MG/ML SOLN
100.0000 mL | Freq: Once | INTRAVENOUS | Status: AC | PRN
  Administered 2021-02-15: 95 mL via INTRAVENOUS

## 2021-02-15 MED ORDER — ROSUVASTATIN CALCIUM 5 MG PO TABS
5.0000 mg | ORAL_TABLET | Freq: Every day | ORAL | Status: DC
  Administered 2021-02-15: 5 mg via ORAL
  Filled 2021-02-15: qty 1

## 2021-02-15 MED ORDER — NITROGLYCERIN 0.4 MG SL SUBL
SUBLINGUAL_TABLET | SUBLINGUAL | Status: AC
  Filled 2021-02-15: qty 2

## 2021-02-15 MED ORDER — NITROGLYCERIN 0.4 MG SL SUBL: 0.4000 mg | SUBLINGUAL_TABLET | SUBLINGUAL | 1 refills | Status: DC | PRN

## 2021-02-15 NOTE — Discharge Summary (Signed)
Discharge Summary    Patient ID: Sierra Brown MRN: [id number]; DOB: Nov 06, 1950  Admit date: 02/14/2021 Discharge date: 02/15/2021  PCP:  Jacky Kindle, FNP   [facility] Providers Cardiologist:  Chrystie Nose, MD   {    Discharge Diagnoses    Active Problems:   Tobacco abuse   Hypertension   Hyperlipidemia   Chest pain    Diagnostic Studies/Procedures    Echo from 02/15/21:   1. Left ventricular ejection fraction, by estimation, is 60 to 65%. The  left ventricle has normal function. The left ventricle has no regional  wall motion abnormalities. There is mild left ventricular hypertrophy.  Left ventricular diastolic parameters  were normal.   2. Right ventricular systolic function is normal. The right ventricular  size is normal. There is normal pulmonary artery systolic pressure. The  estimated right ventricular systolic pressure is 68.6 mmHg.   3. The mitral valve is normal in structure. No evidence of mitral valve  regurgitation.   4. The aortic valve is tricuspid. Aortic valve regurgitation is trivial.  No aortic stenosis is present.   5. The inferior vena cava is normal in size with greater than 50%  respiratory variability, suggesting right atrial pressure of 3 mmHg.   Coronary CTA 02/15/21:  1. Coronary artery calcium score 53.9 Agatston units. This places the patient in the 70th percentile for age and gender, suggesting intermediate risk for future cardiac events.   2.  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     1. Moderate centrilobular and mild paraseptal emphysema. 2. Aortic atherosclerosis.     _____________   History of Present Illness     Per H&P:  [name] is a 70yo F with a hx as stated above who presented to the ED 02/14/21 with intermittent, worsening chest pain. She stated that she was in her usual state of health until several weeks ago when she was followed by her PCP for dizziness/orthostatic hypotension felt to be  related to her antihypertensive medication. She wore a ZIO monitor and mailed this back last week with pending results. She was transitioned to lisinopril from HCTZ with some improvement. Since that time, she reportd intermittent, anterior chest pressure with radiation to her left arm/hand and up into her left jaw and face region. She described the pain as a dull pressure and arm and jaw discomfort as a tingling sensation. Symptoms were occurring at random and are not necessarily with exertion. Episodes occurring at least several times per week and lasting 10-15 minutes in duration. Symptoms would resolve spontaneously. She denied SOB, orthopnea, LE edema, PND, palpitations, recent illness with fever, chills or cough. Early that morning at approximately 3AM, she woke to use the restroom when her symptoms started however were more intense. She stated that she went back to sleep for a few hours in hopes that her symptoms would resolve. Upon waking again at around 8AM, her symptoms were still present. Given this, she called EMS for transport to the ED for further evaluation.    In the ED, EKG with NSR and non-specific TW abnormalities however no acute changes. HsT found to be normal at 5. All other labs appear to be unremarkable. 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    She was referred to Dr. Rennis Golden 08/24/20 from her PCP after physical exam positive for new carotid bruit. On evaluation, Dr. Rennis Golden was unable to hear the bruit himself. Plan at that time was to pursue a carotid doppler  study. This was performed 09/07/20 which showed minimal, non specific carotid disease. ZIO monitor was ordered however it appears this was never worn. She was seen once again 10/08/20. She had recently undergone chest CT imaging for tobacco use which did show multivessel coronary calcifications and aortic atherosclerosis. Based on findings, her statin was intensified.     Brown Course     Consultants:  N/A  Chest pain Non-obstructive CAD  - Presented with chest pain/pressure radiating to left arm/hand/jaw, not triggered by exertion, several times a week, lasting 10 to 15 minutes, resolve spontaneously - High sensitive troponin 5 > 6 - EKG showed sinus rhythm, ventricular rate 69, acute ischemic change - Chest x-ray with emphysema, no acute finding - Echo unremarkable as above  - Coronary CTA showed coronary artery calcium score 53.9 Agatston units. This places the patient in the 70th percentile for age and gender, suggesting intermediate risk for future cardiac events. 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  - recommend continue medical therapy ASA 81 and crestor 5, won't add additional antihypertensive such as BB at this time given adequate control BP, reviewed with MD - Follow-up has been arranged on 04/21/2021 with cardiology   Minimal carotid artery disease  - Referred to Dr. Rennis Golden 08/24/20 from her PCP after physical exam positive for new carotid bruit. Carotid doppler study 09/07/20 showed minimal, non specific carotid disease.  Continue routine surveillance outpatient - continue ASA and crestor    Hypertension -Recently transitioned from HCTZ to lisinopril due to experience of dizziness by PCP,  symptoms are improved, blood pressure controlled, follow with PCP    Dyslipidemia - LDL 73 from 01/20/21, continue home dose Crestor , may repeat in 4-8 weeks  - low fat diet discussed    COPD without exacerbation -Not requiring maintenance treatment at home -Strongly recommended smoking cessation   Tobacco abuse -Strongly recommended smoking cessation   History of heart palpitation with dizziness -Thought was due to antihypertensive, symptom improved after switching hydrochlorothiazide to lisinopril, Zio monitor was requested by PCP, follow-up with PCP on results      Did the patient have an acute coronary syndrome (MI, NSTEMI, STEMI, etc) this admission?:  No                                Did the patient have a percutaneous coronary intervention (stent / angioplasty)?:  No.       _____________  Discharge Vitals Blood pressure (!) 112/50, pulse (!) 57, temperature 97.6 F (36.4 C), temperature source Oral, resp. rate 19, height  (1.626 m), weight 44.9 kg, SpO2 97 %.  Filed Weights   02/14/21 1700 02/15/21 0405  Weight: 45 kg 44.9 kg    Labs & Radiologic Studies    CBC Recent Labs    02/14/21 1022 02/14/21 1350 02/15/21 0324  WBC 7.4 7.6 8.9  NEUTROABS 5.1  --   --   HGB 13.8 13.4 12.8  HCT 40.1 39.4 37.7  MCV 94.8 95.2 95.0  PLT 291 289 270   Basic Metabolic Panel Recent Labs    16/10/96 1022 02/14/21 1350 02/15/21 0324  NA 139  --  138  K 4.1  --  4.2  CL 109  --  106  CO2 24  --  24  GLUCOSE 99  --  91  BUN 13  --  17  CREATININE 0.84 0.87 0.83  CALCIUM 9.0  --  9.1  Liver Function Tests Recent Labs    02/14/21 1022  AST 14*  ALT 11  ALKPHOS 51  BILITOT 0.8  PROT 5.7*  ALBUMIN 3.5   Recent Labs    02/14/21 1022  LIPASE 26   High Sensitivity Troponin:   Recent Labs  Lab 02/14/21 1022 02/14/21 1257  TROPONINIHS 5 6    BNP Invalid input(s): POCBNP D-Dimer No results for input(s): DDIMER in the last 72 hours. Hemoglobin A1C No results for input(s): HGBA1C in the last 72 hours. Fasting Lipid Panel No results for input(s): CHOL, HDL, LDLCALC, TRIG, CHOLHDL, LDLDIRECT in the last 72 hours. Thyroid Function Tests No results for input(s): TSH, T4TOTAL, T3FREE, THYROIDAB in the last 72 hours.  Invalid input(s): FREET3 _____________  CT CORONARY MORPH W/CTA COR W/SCORE W/CA W/CM &/OR WO/CM  Addendum Date: 02/15/2021   ADDENDUM REPORT: 02/15/2021 14:46 CLINICAL DATA:  Chest pain EXAM: Cardiac CTA MEDICATIONS: Sub lingual nitro.  x 2 TECHNIQUE: The patient was scanned on a Siemens 192 slice scanner. Gantry rotation speed was 250 msecs. Collimation was 0.6 mm. A 100 kV prospective scan was triggered in the  ascending thoracic aorta at 35-75% of the R-R interval. Average HR during the scan was 60 bpm. The 3D data set was interpreted on a dedicated work station using MPR, MIP and VRT modes. A total of 80cc of contrast was used. FINDINGS: Non-cardiac: See separate report from [facility]. The pulmonary veins drain normally to the left atrium. No LA appendage thrombus. Calcium Score: 53.9 Agatston units. Coronary Arteries: Right dominant with no anomalies LM: No plaque or stenosis. LAD system: Mixed plaque proximal LAD, mild (<50%) stenosis. Circumflex system: No plaque or stenosis. RCA system: No plaque or stenosis. IMPRESSION: 1. Coronary artery calcium score 53.9 Agatston units. This places the patient in the 70th percentile for age and gender, suggesting intermediate risk for future cardiac events. 2.  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 Dalton Mclean Electronically Signed   By: Marca Ancona M.D.   On: 02/15/2021 14:46   Result Date: 02/15/2021 EXAM: OVER-READ INTERPRETATION  CT CHEST The following report is an over-read performed by radiologist Dr. Trudie Reed of [facility] on 02/15/2021. This over-read does not include interpretation of cardiac or coronary anatomy or pathology. The coronary calcium score/coronary CTA interpretation by the cardiologist is attached. COMPARISON:  Chest CT 09/08/2020. FINDINGS: Atherosclerotic calcifications in the thoracic aorta. Moderate centrilobular and mild paraseptal emphysema. Within the visualized portions of the thorax there are no suspicious appearing pulmonary nodules or masses, there is no acute consolidative airspace disease, no pleural effusions, no pneumothorax and no lymphadenopathy. Visualized portions of the upper the abdomen demonstrates several small low-attenuation lesions in the liver, most of which are too small to definitively characterize, but statistically likely to represent tiny cysts. The largest lesion measures 1 cm in  segment 4A, compatible with a simple cyst. There are no aggressive appearing lytic or blastic lesions noted in the visualized portions of the skeleton. IMPRESSION: 1. Moderate centrilobular and mild paraseptal emphysema. 2. Aortic atherosclerosis. Electronically Signed: By: Trudie Reed M.D. On: 02/15/2021 11:40   DG Chest Port 1 View  Result Date: 02/14/2021 CLINICAL DATA:  Chest pain radiating to the left arm beginning early this morning. EXAM: PORTABLE CHEST 1 VIEW COMPARISON:  04/14/2013 FINDINGS: Cardiac silhouette is normal in size. No mediastinal or hilar masses. Lungs are hyperexpanded. There are thickened interstitial markings most evident at the bases. No evidence of pneumonia or pulmonary edema. No  pleural effusion or pneumothorax. Skeletal structures are grossly intact. IMPRESSION: 1. No acute cardiopulmonary disease. 2. Advanced COPD/emphysema. Electronically Signed   By: Amie Portlandavid  Ormond M.D.   On: 02/14/2021 11:27   ECHOCARDIOGRAM COMPLETE  Result Date: 02/15/2021    ECHOCARDIOGRAM REPORT   Patient Name[facility]   [facility] Date of Exam: 02/15/2021 Medical Rec #:  [id number]        Height:       64.0 in Accession #:    [id number]       Weight:       98.9 lb Date of Birth:  20-Jul-1950       BSA:          1.450 m Patient Age:    69 years         BP:           118/54 mmHg Patient Gender: F                HR:           60 bpm. Exam Location:  Inpatient Procedure: 2D Echo, 3D Echo, Cardiac Doppler and Color Doppler Indications:    R07.9* Chest pain, unspecified  History:        Patient has no prior history of Echocardiogram examinations.                 Abnormal ECG, Signs/Symptoms:Chest Pain; Risk Factors:Current                 Smoker, Hypertension and Dyslipidemia.  Sonographer:    Sheralyn Boatmanina West RDCS Referring Phys: [id number] Tanna SavoyCHRISTOPHER L [facility] XxxCHUMANN  Sonographer Comments: Technically difficult study due to poor echo windows. Very low parasternal. IMPRESSIONS  1. Left ventricular ejection fraction, by  estimation, is 60 to 65%. The left ventricle has normal function. The left ventricle has no regional wall motion abnormalities. There is mild left ventricular hypertrophy. Left ventricular diastolic parameters were normal.  2. Right ventricular systolic function is normal. The right ventricular size is normal. There is normal pulmonary artery systolic pressure. The estimated right ventricular systolic pressure is 68.6 mmHg.  3. The mitral valve is normal in structure. No evidence of mitral valve regurgitation.  4. The aortic valve is tricuspid. Aortic valve regurgitation is trivial. No aortic stenosis is present.  5. The inferior vena cava is normal in size with greater than 50% respiratory variability, suggesting right atrial pressure of 3 mmHg. FINDINGS  Left Ventricle: Left ventricular ejection fraction, by estimation, is 60 to 65%. The left ventricle has normal function. The left ventricle has no regional wall motion abnormalities. The left ventricular internal cavity size was normal in size. There is  mild left ventricular hypertrophy. Left ventricular diastolic parameters were normal. Right Ventricle: The right ventricular size is normal. No increase in right ventricular wall thickness. Right ventricular systolic function is normal. There is normal pulmonary artery systolic pressure. The tricuspid regurgitant velocity is 2.27 m/s, and  with an assumed right atrial pressure of 3 mmHg, the estimated right ventricular systolic pressure is 68.6 mmHg. Left Atrium: Left atrial size was normal in size. Right Atrium: Right atrial size was normal in size. Pericardium: Trivial pericardial effusion is present. Mitral Valve: The mitral valve is normal in structure. No evidence of mitral valve regurgitation. Tricuspid Valve: The tricuspid valve is normal in structure. Tricuspid valve regurgitation is trivial. Aortic Valve: The aortic valve is tricuspid. Aortic valve regurgitation is trivial. No aortic stenosis is present.  Pulmonic Valve: The pulmonic valve  was not well visualized. Pulmonic valve regurgitation is trivial. Aorta: The aortic root and ascending aorta are structurally normal, with no evidence of dilitation. Venous: The inferior vena cava is normal in size with greater than 50% respiratory variability, suggesting right atrial pressure of 3 mmHg. IAS/Shunts: The interatrial septum was not well visualized.  LEFT VENTRICLE PLAX 2D LVIDd:         3.70 cm     Diastology LVIDs:         2.50 cm     LV e' medial:    9.57 cm/s LV PW:         1.30 cm     LV E/e' medial:  10.4 LV IVS:        0.70 cm     LV e' lateral:   10.00 cm/s LVOT diam:     1.80 cm     LV E/e' lateral: 9.9 LV SV:         62 LV SV Index:   42 LVOT Area:     2.54 cm  LV Volumes (MOD) LV vol d, MOD A2C: 43.3 ml LV vol d, MOD A4C: 40.1 ml LV vol s, MOD A2C: 15.2 ml LV vol s, MOD A4C: 15.2 ml LV SV MOD A2C:     28.1 ml LV SV MOD A4C:     40.1 ml LV SV MOD BP:      28.0 ml RIGHT VENTRICLE             IVC RV S prime:     11.10 cm/s  IVC diam: 1.50 cm TAPSE (M-mode): 2.0 cm LEFT ATRIUM             Index       RIGHT ATRIUM           Index LA diam:        2.60 cm 1.79 cm/m  RA Area:     11.10 cm LA Vol (A2C):   20.2 ml 13.93 ml/m RA Volume:   26.50 ml  18.27 ml/m LA Vol (A4C):   18.9 ml 13.03 ml/m LA Biplane Vol: 19.6 ml 13.51 ml/m  AORTIC VALVE             PULMONIC VALVE LVOT Vmax:   104.00 cm/s PR End Diast Vel: 1.60 msec LVOT Vmean:  65.700 cm/s LVOT VTI:    0.242 m  AORTA Ao Root diam: 2.70 cm Ao Asc diam:  3.25 cm MITRAL VALVE               TRICUSPID VALVE MV Area (PHT): 3.72 cm    TR Peak grad:   20.6 mmHg MV Decel Time: 204 msec    TR Vmax:        227.00 cm/s MV E velocity: 99.10 cm/s MV A velocity: 63.90 cm/s  SHUNTS MV E/A ratio:  1.55        Systemic VTI:  0.24 m                            Systemic Diam: 1.80 cm Epifanio Lesches MD Electronically signed by Epifanio Lesches MD Signature Date/Time: 02/15/2021/10:56:05 AM    Final    CT Renal Stone  Study  Result Date: 02/02/2021 CLINICAL DATA:  Right flank pain EXAM: CT ABDOMEN AND PELVIS WITHOUT CONTRAST TECHNIQUE: Multidetector CT imaging of the abdomen and pelvis was performed following the standard protocol without IV contrast. COMPARISON:  CT chest 09/08/2020 FINDINGS:  Lower chest: Extensive emphysematous changes and reticular opacities in the lung bases as well as more bandlike areas of scarring and bronchiectatic architectural distortion. Normal heart size. No pericardial effusion. Hepatobiliary: Subcentimeter hypoattenuating focus along the anterior right lobe liver measuring up to 8 mm in size (2/9) and an additional 9 mm focus in the posterior right lobe liver (2/20). Too small to fully characterize on CT imaging but statistically likely benign. No concerning focal liver lesion. Normal hepatic attenuation. Smooth liver surface contour. Gallbladder largely decompressed at the time of exam. No visible calcified gallstone or biliary ductal dilatation. Pancreas: No pancreatic ductal dilatation or surrounding inflammatory changes. Spleen: Normal in size. No concerning splenic lesions. Adrenals/Urinary Tract: No concerning adrenal masses. No visible or contour deforming renal lesions. Bilateral extrarenal pelves with asymmetric moderate right hydroureteronephrosis seen to the level of a 2 mm calculus in the distal right ureter just proximal to the UVJ. Asymmetric right perinephric and periureteral stranding. No other obstructing urolithiasis. Additional nonobstructing calculi present in the upper pole right kidney and lower pole left kidney. Urinary bladder is unremarkable for the degree of distention. Stomach/Bowel: Distal esophagus, stomach and duodenal sweep are unremarkable. No small bowel wall thickening or dilatation. No evidence of obstruction. A normal appendix is visualized. No colonic dilatation or wall thickening. Scattered colonic diverticula without focal inflammation to suggest  diverticulitis. Vascular/Lymphatic: Atherosclerotic calcifications within the abdominal aorta and branch vessels. No aneurysm or ectasia. No enlarged abdominopelvic lymph nodes. Some reactive retroperitoneal adenopathy. Reproductive: Uterus and bilateral adnexa are unremarkable. Other: Small volume retroperitoneal free fluid in the posterior right pararenal space, favored to be reactive. Additional asymmetric right retroperitoneal stranding centered upon the obstructed right kidney. No free air. No organized collection or abscess. Remote postsurgical changes the anterior abdominal wall. No bowel containing hernia. Musculoskeletal: Multilevel degenerative changes are present in the imaged portions of the spine. No acute osseous abnormality or suspicious osseous lesion. Transitional lumbosacral vertebrae. Additional degenerative changes in the hips. IMPRESSION: Moderate right hydroureteronephrosis the level of a 2 mm calculus just proximal to the right UVJ. Asymmetric right perinephric and periureteral stranding and a small amount of right retroperitoneal free fluid, favored to be reactive though a small forniceal rupture cannot be entirely excluded. Additional punctate nonobstructing calculi bilaterally. Colonic diverticulosis without evidence of diverticulitis. Scarring and architectural distortion with reticular interstitial opacities and emphysematous changes in the lung bases, incompletely assessed on this exam. Electronically Signed   By: Kreg Shropshire M.D.   On: 02/02/2021 03:44   Disposition   Echo and CT coronary study result reviewed with the patient. She is agreeable with above medication change. Pt is being discharged home today in good condition.  Follow-up Plans & Appointments     Follow-up Information     Beatriz Stallion., PA-C Follow up on 04/21/2021.   Specialties: Physician Assistant, Cardiology Why: at 3:45AM for your [facility] follow up appointment Contact  information: [address] [place] [address] [phone]               Follow up with PCP as scheduled.  Discharge Instructions     Diet - low sodium heart healthy   Complete by: As directed    Discharge instructions   Complete by: As directed    Check your blood pressure daily and bring records to PCP for check up  Repeat lipid panel in 2 month   Follow up with cardiology on 04/21/21 as scheduled   Increase activity slowly  Complete by: As directed        Discharge Medications   Allergies as of 02/15/2021       Reactions   Atorvastatin Other (See Comments)   Myalgias   Codeine Nausea And Vomiting   Levofloxacin Nausea And Vomiting   Sulfonamide Derivatives Hives   Penicillins Hives, Rash   Sulfa Antibiotics Hives, Rash        Medication List     STOP taking these medications    B-12 1000 MCG Subl   hydrochlorothiazide 12.5 MG tablet Commonly known as: HYDRODIURIL   ketorolac 10 MG tablet Commonly known as: TORADOL   omeprazole 20 MG capsule Commonly known as: PRILOSEC   ondansetron 4 MG disintegrating tablet Commonly known as: Zofran ODT       TAKE these medications    acetaminophen 500 MG tablet Commonly known as: TYLENOL Take 500-1,000 mg by mouth every 6 (six) hours as needed for headache (pain).   aspirin EC 81 MG tablet Take 1 tablet (81 mg total) by mouth daily. Swallow whole.   fluticasone 50 MCG/ACT nasal spray Commonly known as: FLONASE Place 1 spray into the nose daily as needed for allergies or rhinitis (seasonal allergies).   hydrOXYzine 25 MG capsule Commonly known as: VISTARIL Take 1 capsule (25 mg total) by mouth every 8 (eight) hours as needed for anxiety.   lisinopril 2.5 MG tablet Commonly known as: ZESTRIL Take 2 tablets (5 mg total) by mouth daily.   Loratadine 10 MG Caps Take 10 mg by mouth daily as needed (seasonal allergies).   nitroGLYCERIN 0.4 MG SL tablet Commonly known as:  NITROSTAT Place 1 tablet (0.4 mg total) under the tongue every 5 (five) minutes x 3 doses as needed for chest pain.   omeprazole 20 MG tablet Commonly known as: PRILOSEC OTC Take 20 mg by mouth every morning.   rosuvastatin 5 MG tablet Commonly known as: Crestor Take 1 tablet (5 mg total) by mouth daily.   traZODone 50 MG tablet Commonly known as: DESYREL Take 0.5-1 tablets (25-50 mg total) by mouth at bedtime as needed for sleep.          Outstanding Labs/Studies   N/A   Duration of Discharge Encounter   Greater than 30 minutes including physician time.  Signed, Cyndi Bender, NP 02/15/2021, 3:32 PM

## 2021-02-15 NOTE — Plan of Care (Signed)
Problem: Education: Goal: Knowledge of General Education information will improve Description: Including pain rating scale, medication(s)/side effects and non-pharmacologic comfort measures 02/15/2021 1617 by Ancil Linsey, RN Outcome: Adequate for Discharge 02/15/2021 1617 by Ancil Linsey, RN Outcome: Adequate for Discharge   Problem: Health Behavior/Discharge Planning: Goal: Ability to manage health-related needs will improve 02/15/2021 1617 by Ancil Linsey, RN Outcome: Adequate for Discharge 02/15/2021 1617 by Ancil Linsey, RN Outcome: Adequate for Discharge   Problem: Clinical Measurements: Goal: Ability to maintain clinical measurements within normal limits will improve 02/15/2021 1617 by Ancil Linsey, RN Outcome: Adequate for Discharge 02/15/2021 1617 by Ancil Linsey, RN Outcome: Adequate for Discharge Goal: Will remain free from infection 02/15/2021 1617 by Ancil Linsey, RN Outcome: Adequate for Discharge 02/15/2021 1617 by Ancil Linsey, RN Outcome: Adequate for Discharge Goal: Diagnostic test results will improve 02/15/2021 1617 by Ancil Linsey, RN Outcome: Adequate for Discharge 02/15/2021 1617 by Ancil Linsey, RN Outcome: Adequate for Discharge Goal: Respiratory complications will improve 02/15/2021 1617 by Ancil Linsey, RN Outcome: Adequate for Discharge 02/15/2021 1617 by Ancil Linsey, RN Outcome: Adequate for Discharge Goal: Cardiovascular complication will be avoided 02/15/2021 1617 by Ancil Linsey, RN Outcome: Adequate for Discharge 02/15/2021 1617 by Ancil Linsey, RN Outcome: Adequate for Discharge   Problem: Activity: Goal: Risk for activity intolerance will decrease 02/15/2021 1617 by Ancil Linsey, RN Outcome: Adequate for Discharge 02/15/2021 1617 by Ancil Linsey, RN Outcome: Adequate for Discharge   Problem: Nutrition: Goal: Adequate nutrition will be  maintained 02/15/2021 1617 by Ancil Linsey, RN Outcome: Adequate for Discharge 02/15/2021 1617 by Ancil Linsey, RN Outcome: Adequate for Discharge   Problem: Coping: Goal: Level of anxiety will decrease 02/15/2021 1617 by Ancil Linsey, RN Outcome: Adequate for Discharge 02/15/2021 1617 by Ancil Linsey, RN Outcome: Adequate for Discharge   Problem: Elimination: Goal: Will not experience complications related to bowel motility 02/15/2021 1617 by Ancil Linsey, RN Outcome: Adequate for Discharge 02/15/2021 1617 by Ancil Linsey, RN Outcome: Adequate for Discharge Goal: Will not experience complications related to urinary retention 02/15/2021 1617 by Ancil Linsey, RN Outcome: Adequate for Discharge 02/15/2021 1617 by Ancil Linsey, RN Outcome: Adequate for Discharge   Problem: Pain Managment: Goal: General experience of comfort will improve 02/15/2021 1617 by Ancil Linsey, RN Outcome: Adequate for Discharge 02/15/2021 1617 by Ancil Linsey, RN Outcome: Adequate for Discharge   Problem: Safety: Goal: Ability to remain free from injury will improve 02/15/2021 1617 by Ancil Linsey, RN Outcome: Adequate for Discharge 02/15/2021 1617 by Ancil Linsey, RN Outcome: Adequate for Discharge   Problem: Skin Integrity: Goal: Risk for impaired skin integrity will decrease 02/15/2021 1617 by Ancil Linsey, RN Outcome: Adequate for Discharge 02/15/2021 1617 by Ancil Linsey, RN Outcome: Adequate for Discharge   Problem: Education: Goal: Ability to demonstrate management of disease process will improve 02/15/2021 1617 by Ancil Linsey, RN Outcome: Adequate for Discharge 02/15/2021 1617 by Ancil Linsey, RN Outcome: Adequate for Discharge Goal: Ability to verbalize understanding of medication therapies will improve 02/15/2021 1617 by Ancil Linsey, RN Outcome: Adequate for Discharge 02/15/2021  1617 by Ancil Linsey, RN Outcome: Adequate for Discharge Goal: Individualized Educational Video(s) 02/15/2021 1617 by Ancil Linsey, RN Outcome: Adequate for Discharge 02/15/2021 1617 by Ancil Linsey, RN Outcome: Adequate for Discharge   Problem: Activity: Goal: Capacity to carry out  activities will improve 02/15/2021 1617 by Ancil Linsey, RN Outcome: Adequate for Discharge 02/15/2021 1617 by Ancil Linsey, RN Outcome: Adequate for Discharge   Problem: Cardiac: Goal: Ability to achieve and maintain adequate cardiopulmonary perfusion will improve 02/15/2021 1617 by Ancil Linsey, RN Outcome: Adequate for Discharge 02/15/2021 1617 by Ancil Linsey, RN Outcome: Adequate for Discharge

## 2021-02-15 NOTE — Progress Notes (Signed)
  Echocardiogram 2D Echocardiogram has been performed.  Sierra Brown 02/15/2021, 9:03 AM

## 2021-02-15 NOTE — Progress Notes (Addendum)
Progress Note  Patient Name: Sierra Brown Date of Encounter: 02/15/2021  [facility] Cardiologist: Chrystie Nose, MD   Subjective   Patient states she is doing well, eager to go home, denied any chest pain since admission.  She is tolerating activities in the room without problems.  She is wondering when can she go home and when is the testing will be completed today.  She states her blood pressure sometimes goes up to 150s.  Inpatient Medications    Scheduled Meds:  aspirin  324 mg Oral NOW   Or   aspirin  300 mg Rectal NOW   aspirin EC  81 mg Oral Daily   heparin  5,000 Units Subcutaneous Q8H   lisinopril  5 mg Oral Daily   metoprolol tartrate  50 mg Oral 30 min Pre-Op   rosuvastatin  5 mg Oral Daily   Continuous Infusions:  PRN Meds: acetaminophen, nitroGLYCERIN, ondansetron (ZOFRAN) IV   Vital Signs    Vitals:   02/14/21 1700 02/14/21 2019 02/15/21 0026 02/15/21 0405  BP:  108/68 103/82 (!) 118/54  Pulse:  72 (!) 55 63  Resp:  16 18 16   Temp:  98.2 F (36.8 C) 98.2 F (36.8 C) 98.2 F (36.8 C)  TempSrc:  Oral Oral Oral  SpO2:  97% 97% 98%  Weight: 45 kg   44.9 kg  Height: 5\' 4"  (1.626 m)      No intake or output data in the 24 hours ending 02/15/21 0658 Last 3 Weights 02/15/2021 02/14/2021 02/01/2021  Weight (lbs) 98 lb 14.4 oz 99 lb 3.2 oz 105 lb  Weight (kg) 44.861 kg 44.997 kg 47.628 kg      Telemetry    Sinus rhythm/bradycardia with ventricular rate of 50-70s- Personally Reviewed  ECG    No new tracing today- Personally Reviewed  Physical Exam   GEN: No acute distress.  Sitting in bed, resting in street clothes; cachectic Neck: No JVD Cardiac: RRR, no murmurs, rubs, or gallops.  Respiratory: Clear to auscultation bilaterally.  On room air, speak full sentence GI: Soft, nontender, non-distended  MS: No bilateral lower extremity edema; No deformity. Neuro:  Nonfocal  Psych: Normal affect   Labs    High Sensitivity Troponin:    Recent Labs  Lab 02/14/21 1022 02/14/21 1257  TROPONINIHS 5 6      Chemistry Recent Labs  Lab 02/14/21 1022 02/14/21 1350 02/15/21 0324  NA 139  --  138  K 4.1  --  4.2  CL 109  --  106  CO2 24  --  24  GLUCOSE 99  --  91  BUN 13  --  17  CREATININE 0.84 0.87 0.83  CALCIUM 9.0  --  9.1  PROT 5.7*  --   --   ALBUMIN 3.5  --   --   AST 14*  --   --   ALT 11  --   --   ALKPHOS 51  --   --   BILITOT 0.8  --   --   GFRNONAA >60 >60 >60  ANIONGAP 6  --  8     Hematology Recent Labs  Lab 02/14/21 1022 02/14/21 1350 02/15/21 0324  WBC 7.4 7.6 8.9  RBC 4.23 4.14 3.97  HGB 13.8 13.4 12.8  HCT 40.1 39.4 37.7  MCV 94.8 95.2 95.0  MCH 32.6 32.4 32.2  MCHC 34.4 34.0 34.0  RDW 12.0 12.0 12.2  PLT 291 289 270  BNPNo results for input(s): BNP, PROBNP in the last 168 hours.   DDimer No results for input(s): DDIMER in the last 168 hours.   Radiology    DG Chest Port 1 View  Result Date: 02/14/2021 CLINICAL DATA:  Chest pain radiating to the left arm beginning early this morning. EXAM: PORTABLE CHEST 1 VIEW COMPARISON:  04/14/2013 FINDINGS: Cardiac silhouette is normal in size. No mediastinal or hilar masses. Lungs are hyperexpanded. There are thickened interstitial markings most evident at the bases. No evidence of pneumonia or pulmonary edema. No pleural effusion or pneumothorax. Skeletal structures are grossly intact. IMPRESSION: 1. No acute cardiopulmonary disease. 2. Advanced COPD/emphysema. Electronically Signed   By: Amie Portland M.D.   On: 02/14/2021 11:27    Cardiac Studies   Echocardiogram from 02/15/2021: Pending   Coronary CTA from 02/15/21: Pending     Patient Profile     Sierra Brown is a 70 y.o. female with a PMH of mild carotid artery disease, dyslipidemia, COPD, tobacco abuse, hypertension, GERD, who is currently admitted under cardiology for chest pain.   Assessment & Plan    Chest pain - Presented with chest pain/pressure radiating to  left arm/hand/jaw, not triggered by exertion, several times a week, lasting 10 to 15 minutes, resolve spontaneously - High sensitive troponin 5 > 6 - EKG showed sinus rhythm, ventricular rate 69, acute ischemic change - Chest x-ray with emphysema, no acute finding - Given significant CAD risk factor and coronary calcification noted CT 09/08/2020, decision was made to pursue coronary CTA today, metoprolol given prior to CT for optimal heart rate - Echo pending  - Dispo planning per CT coronary study, likely will discharge to home today  Minimal carotid artery disease  - Referred to Dr. Rennis Golden 08/24/20 from her PCP after physical exam positive for new carotid bruit. Carotid doppler study 09/07/20 showed minimal, non specific carotid disease.  Continue routine surveillance outpatient - continue ASA and crestor   Hypertension -Recently transitioned from HCTZ to lisinopril due to experience of dizziness by PCP,  symptoms are improved, blood pressure controlled, follow with PCP   Dyslipidemia - LDL 73 from 01/20/21, continue home dose Crestor 5mg    COPD without exacerbation -Not requiring maintenance treatment at home -Strongly recommended smoking cessation  Tobacco abuse -Strongly recommended smoking cessation  History of heart palpitation with dizziness -Thought was due to antihypertensive, symptom improved after switching hydrochlorothiazide to lisinopril, Zio monitor was requested by PCP, follow-up with PCP on results      For questions or updates, please contact [facility] Please consult [email or web address] for contact info under        Signed, , NP  02/15/2021, 6:58 AM    Patient seen, examined. Available data reviewed. Agree with findings, assessment, and plan as outlined by 02/17/2021, NP.  The patient is independently interviewed and examined.  She is alert, oriented, thin woman in no distress.  Lungs are clear, heart is regular rate and rhythm with no murmur gallop,  abdomen is soft and nontender, extremities have no edema.  Troponins are negative x2.  Echocardiogram is reviewed from this morning and it is within normal limits with normal LV and RV function, no valvular disease.  Awaiting gated coronary CTA study.  Discussed her blood pressure issues at length.  Sounds like she was only recently diagnosed with hypertension and may have a strong component of whitecoat hypertension.  She has been having symptomatic low blood pressure episodes at home.  Hospital readings are  in the low normal range on lisinopril 5 mg daily.  We will review meds upon discharge.  We will follow-up once her coronary CTA is completed.  I would anticipate discharge home later today as long as there is no high-grade obstructive coronary artery disease identified.  Tonny Bollman, M.D. 02/15/2021 11:39 AM

## 2021-02-15 NOTE — Plan of Care (Signed)

## 2021-02-17 ENCOUNTER — Telehealth: Payer: Self-pay

## 2021-02-17 ENCOUNTER — Telehealth (INDEPENDENT_AMBULATORY_CARE_PROVIDER_SITE_OTHER): Payer: Medicare HMO | Admitting: Family Medicine

## 2021-02-17 DIAGNOSIS — I10 Essential (primary) hypertension: Secondary | ICD-10-CM

## 2021-02-17 DIAGNOSIS — R002 Palpitations: Secondary | ICD-10-CM | POA: Diagnosis not present

## 2021-02-17 NOTE — Telephone Encounter (Signed)
Copied from CRM [id number]. Topic: General - Other >> Feb 17, 2021  4:14 PM Aretta Nip wrote: Reason for CRM: pt is fu with a patch report call that she missed from [facility] nurse. Pt is now with her phone and would like a cb if possible. Was not released to NT   pls fu with pt at [phone]

## 2021-02-17 NOTE — Telephone Encounter (Signed)
Zio Patch results reviewed and summarized for CMA call to patient.

## 2021-02-18 NOTE — Telephone Encounter (Signed)
FYI. Thanks.

## 2021-02-18 NOTE — Telephone Encounter (Signed)
Pt given Zio XTresults per notes of E. Suzie Portela, FNP on 02/18/21. Pt verbalized understanding and reports she was hospitalized 2 days after monitor removed for a kidney stone. Patient requests PCP to review her chart for more information. Denies symptoms of heart issues at this time. Patient would like to know if appt needed with PCP. Patient will see cardiologist in October. Patient reports PCP can send message vis My Chart if needed.

## 2021-02-18 NOTE — Telephone Encounter (Signed)
See previous phone encounter with results. KW

## 2021-02-18 NOTE — Telephone Encounter (Signed)
Attempted to reach patient but voicemail box has not been set up yet. When patient returns call [facility] nurse triage can review over results below with patient. KW

## 2021-03-29 ENCOUNTER — Other Ambulatory Visit: Payer: Self-pay | Admitting: Family Medicine

## 2021-03-30 ENCOUNTER — Ambulatory Visit: Payer: Self-pay | Admitting: *Deleted

## 2021-03-30 NOTE — Telephone Encounter (Signed)
Patient has been advised and verbalized understanding. KW

## 2021-03-30 NOTE — Telephone Encounter (Signed)
Pt reports sinus type pressure ears, eyes, forehead,  onset Sunday. Reports "Happens every year at this time." Denies fever, no SOB. Reports "Post nasal drip" clear. Denies ear ache "Just that feeling of pressure."  HAs taken Dramamine for nausea and Flonase. States "Staying hydrated and resting, heating pad helps." HAs not tested for covid, no known exposure. No availability at practice.Pt states will go to UC "If I don't feel better after taking some other meds from drugstore." ADvised UC for fever, worsening symptoms.  Home care advise given per protocol. Pt verbalizes understanding.

## 2021-03-30 NOTE — Telephone Encounter (Signed)
Reason for Disposition  [1] Sinus congestion as part of a cold AND [2] present < 10 days  Answer Assessment - Initial Assessment Questions 1. LOCATION: "Where does it hurt?"      Ears, facial pain, frontal headache, scratchy throat 2. ONSET: "When did the sinus pain start?"  (e.g., hours, days)      Sunday 3. SEVERITY: "How bad is the pain?"   (Scale 1-10; mild, moderate or severe)   - MILD (1-3): doesn't interfere with normal activities    - MODERATE (4-7): interferes with normal activities (e.g., work or school) or awakens from sleep   - SEVERE (8-10): excruciating pain and patient unable to do any normal activities        5/10 4. RECURRENT SYMPTOM: "Have you ever had sinus problems before?" If Yes, ask: "When was the last time?" and "What happened that time?"      Yes this time of year 5. NASAL CONGESTION: "Is the nose blocked?" If Yes, ask: "Can you open it or must you breathe through your mouth?"     Clear 6. NASAL DISCHARGE: "Do you have discharge from your nose?" If so ask, "What color?"     Some. clear 7. FEVER: "Do you have a fever?" If Yes, ask: "What is it, how was it measured, and when did it start?"      no 8. OTHER SYMPTOMS: "Do you have any other symptoms?" (e.g., sore throat, cough, earache, difficulty breathing)     Scratchy throat  Protocols used: Sinus Pain or Congestion-A-AH

## 2021-04-20 NOTE — Progress Notes (Deleted)
Cardiology Office Note   Date:  04/20/2021   ID:  Sierra Brown, DOB 07-31-1950, MRN [id number]  PCP:  Jacky Kindle, FNP  Cardiologist:  Chrystie Nose, MD EP: None  No chief complaint on file.     History of Present Illness: Sierra Brown is a 70 y.o. female with PMH of nonobstructive CAD, carotid artery disease, HTN, HLD, COPD, and tobacco abuse, who presents for hospital follow-up.  She was admitted to the hospital 01/2021 after presenting with chest pain HsTrop's were negative x2.  EKG without ischemic changes.  She underwent a coronary CTA which showed calcium score 53.9 placing her in the 70th percentile for age/gender, and evidence of mild nonobstructive CAD in the LAD.  She was recommended for aggressive risk factor modifications    Past Medical History:  Diagnosis Date   Allergy    allerlgic rhinitis   Cataract    COPD (chronic obstructive pulmonary disease) (HCC)    Pneumonia    Tobacco use disorder     Past Surgical History:  Procedure Laterality Date   CESAREAN SECTION     TONSILLECTOMY       Current Outpatient Medications  Medication Sig Dispense Refill   acetaminophen (TYLENOL) 500 MG tablet Take 500-1,000 mg by mouth every 6 (six) hours as needed for headache (pain).     aspirin EC 81 MG tablet Take 1 tablet (81 mg total) by mouth daily. Swallow whole. 90 tablet 2   fluticasone (FLONASE) 50 MCG/ACT nasal spray Place 1 spray into the nose daily as needed for allergies or rhinitis (seasonal allergies).     hydrOXYzine (VISTARIL) 25 MG capsule Take 1 capsule (25 mg total) by mouth every 8 (eight) hours as needed for anxiety. 30 capsule 0   lisinopril (ZESTRIL) 2.5 MG tablet Take 2 tablets (5 mg total) by mouth daily. 30 tablet 3   Loratadine 10 MG CAPS Take 10 mg by mouth daily as needed (seasonal allergies).     nitroGLYCERIN (NITROSTAT) 0.4 MG SL tablet Place 1 tablet (0.4 mg total) under the tongue every 5 (five) minutes x 3 doses as needed  for chest pain. 20 tablet 1   omeprazole (PRILOSEC OTC) 20 MG tablet Take 20 mg by mouth every morning.     rosuvastatin (CRESTOR) 5 MG tablet TAKE 1 TABLET(5 MG) BY MOUTH DAILY 90 tablet 0   traZODone (DESYREL) 50 MG tablet Take 0.5-1 tablets (25-50 mg total) by mouth at bedtime as needed for sleep. 15 tablet 0   No current facility-administered medications for this visit.    Allergies:   Atorvastatin, Codeine, Levofloxacin, Sulfonamide derivatives, Penicillins, and Sulfa antibiotics    Social History:  The patient  reports that she has been smoking cigarettes. She has a 36.75 pack-year smoking history. She has never used smokeless tobacco. She reports that she does not drink alcohol and does not use drugs.   Family History:  The patient's ***family history includes Heart Problems in her mother; Hypertension in her mother; Ulcerative colitis in her mother.    ROS:  Please see the history of present illness.   Otherwise, review of systems are positive for {NONE DEFAULTED:18576}.   All other systems are reviewed and negative.    PHYSICAL EXAM: VS:  There were no vitals taken for this visit. , BMI There is no height or weight on file to calculate BMI. GEN: Well nourished, well developed, in no acute distress HEENT: normal Neck: no JVD, carotid bruits, or  masses Cardiac: ***RRR; no murmurs, rubs, or gallops,no edema  Respiratory:  clear to auscultation bilaterally, normal work of breathing GI: soft, nontender, nondistended, + BS MS: no deformity or atrophy Skin: warm and dry, no rash Neuro:  Strength and sensation are intact Psych: euthymic mood, full affect   EKG:  EKG {ACTION; IS/IS GTX:64680321} ordered today. The ekg ordered today demonstrates ***   Recent Labs: 01/20/2021: Magnesium 2.3; TSH 1.250 02/14/2021: ALT 11 02/15/2021: BUN 17; Creatinine, Ser 0.83; Hemoglobin 12.8; Platelets 270; Potassium 4.2; Sodium 138    Lipid Panel    Component Value Date/Time   CHOL 153  01/20/2021 0845   TRIG 109 01/20/2021 0845   HDL 60 01/20/2021 0845   CHOLHDL 2.6 01/20/2021 0845   LDLCALC 73 01/20/2021 0845      Wt Readings from Last 3 Encounters:  02/15/21 98 lb 14.4 oz (44.9 kg)  02/01/21 105 lb (47.6 kg)  01/26/21 101 lb 6.4 oz (46 kg)      Other studies Reviewed: Additional studies/ records that were reviewed today include:   Coronary CTA 01/2021: 1. Coronary artery calcium score 53.9 Agatston units. This places the patient in the 70th percentile for age and gender, suggesting intermediate risk for future cardiac events.   2.  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    ASSESSMENT AND PLAN:  1.  ***   Current medicines are reviewed at length with the patient today.  The patient {ACTIONS; HAS/DOES NOT HAVE:19233} concerns regarding medicines.  The following changes have been made:  {PLAN; NO CHANGE:13088:s}  Labs/ tests ordered today include: *** No orders of the defined types were placed in this encounter.    Disposition:   FU with *** in {gen number 2-24:825003} {Days to years:10300}  Signed, Beatriz Stallion, PA-C  04/20/2021 11:16 PM

## 2021-04-21 ENCOUNTER — Ambulatory Visit: Payer: Medicare HMO | Admitting: Medical

## 2021-04-22 ENCOUNTER — Ambulatory Visit: Payer: Self-pay | Admitting: Family Medicine

## 2021-04-29 NOTE — Progress Notes (Signed)
Cardiology Office Note:    Date:  04/29/2021   ID:  Sierra Brown, DOB [birth year], MRN [id number]  PCP:  Gwyneth Sprout, FNP   [facility] Providers Cardiologist:  Pixie Casino, MD     Referring MD: Gwyneth Sprout, FNP   No chief complaint on file. Atypical chest pain   History of Present Illness:    Sierra Brown is a 70 y.o. female with a hx of renal stone, COPD, smoking here for referral for hx of chest pain  Hospital course 02/15/2021 She reported episodes occurring at least several times per week and lasting 10-15 minutes in duration. Symptoms would resolve spontaneously. She denied SOB, orthopnea, LE edema, PND, palpitations, recent illness with fever, chills or cough. Early that morning at approximately 3AM, she woke to use the restroom when her symptoms started however were more intense  In the ED, EKG with NSR  anteroseptal q waves. No acute changes. HsT found to be normal at 5. All other labs appear to be unremarkable  She was referred to Dr. Debara Pickett 08/24/20 from her PCP after physical exam positive for new carotid bruit. On evaluation, Dr. Debara Pickett was unable to hear the bruit himself. Plan at that time was to pursue a carotid doppler study. This was performed 09/07/20 which showed minimal, non specific carotid disease. ZIO monitor was ordered however it appears this was never worn. Carotid doppler study 09/07/20 showed minimal, non specific carotid disease.   Today she notes that she was started on crestor. She has no issues. She gets a heaviness sometimes in her chest and SOB. She notes she has a lot of anxiety and feels its is associated. It does not happen all the time. She feels heartburn. She typically walks on a regular bases. [place], she walks with a partner. She has no limitations. She works at a part-time job. She walks at work without any problems. She stopped the blood pressure medication. She would drop to the 90s for SBP. She was  symptomatic. Without blood pressure medication her Bps are in normal range. She had one BP in the 140s she had a tooth abscess. She is noted to have white coat syndrome. No Le edema, no PND, no orthopnea. Has reflux and needs to sit up. She is still smoking, she needs it with nervousness. She has not tried any therapies to quit.    CAC 53 normal echo  LDL 73   Coronary CTA  Coronary Arteries: Right dominant with no anomalies   LM: No plaque or stenosis.   LAD system: Mixed plaque proximal LAD, mild (<50%) stenosis.   Circumflex system: No plaque or stenosis.   RCA system: No plaque or stenosis.  IMPRESSION: 1. Coronary artery calcium score 53.9 Agatston units. This places the patient in the 70th percentile for age and gender, suggesting intermediate risk for future cardiac events.  Past Medical History:  Diagnosis Date   Allergy    allerlgic rhinitis   Cataract    COPD (chronic obstructive pulmonary disease) ([place])    Pneumonia    Tobacco use disorder     Past Surgical History:  Procedure Laterality Date   CESAREAN SECTION     TONSILLECTOMY      Current Medications: No outpatient medications have been marked as taking for the 04/30/21 encounter (Appointment) with Janina Mayo, MD.     Allergies:   Atorvastatin, Codeine, Levofloxacin, Sulfonamide derivatives, Penicillins, and Sulfa antibiotics   Social History  Socioeconomic History   Marital status: Married    Spouse name: Not on file   Number of children: Not on file   Years of education: Not on file   Highest education level: Not on file  Occupational History   Not on file  Tobacco Use   Smoking status: Every Day    Packs/day: 0.75    Years: 49.00    Pack years: 36.75    Types: Cigarettes   Smokeless tobacco: Never  Substance and Sexual Activity   Alcohol use: Never   Drug use: Never   Sexual activity: Not on file  Other Topics Concern   Not on file  Social History Narrative   Not on file    Social Determinants of Health   Financial Resource Strain: Not on file  Food Insecurity: Not on file  Transportation Needs: Not on file  Physical Activity: Not on file  Stress: Not on file  Social Connections: Not on file     Family History: The patient's family history includes Heart Problems in her mother; Hypertension in her mother; Ulcerative colitis in her mother. There is no history of Breast cancer.  ROS:   Please see the history of present illness.     All other systems reviewed and are negative.  EKGs/Labs/Other Studies Reviewed:    The following studies were reviewed today:   EKG:  EKG is  ordered today.  The ekg ordered today demonstrates  NSR, anterior q waves  Recent Labs: 01/20/2021: Magnesium 2.3; TSH 1.250 02/14/2021: ALT 11 02/15/2021: BUN 17; Creatinine, Ser 0.83; Hemoglobin 12.8; Platelets 270; Potassium 4.2; Sodium 138   Recent Lipid Panel    Component Value Date/Time   CHOL 153 01/20/2021 0845   TRIG 109 01/20/2021 0845   HDL 60 01/20/2021 0845   CHOLHDL 2.6 01/20/2021 0845   LDLCALC 73 01/20/2021 0845     Risk Assessment/Calculations:     The 10-year ASCVD risk score (Arnett DK, et al., 2019) is: 13.7%   Values used to calculate the score:     Age: 69 years     Sex: Female     Is Non-Hispanic African American: No     Diabetic: No     Tobacco smoker: Yes     Systolic Blood Pressure: 128 mmHg     Is BP treated: Yes     HDL Cholesterol: 60 mg/dL     Total Cholesterol: 153 mg/dL       Physical Exam:    VS:   Vitals:   04/30/21 1310  BP: 128/60  Pulse: 75  Resp: 20     Wt Readings from Last 3 Encounters:  02/15/21 98 lb 14.4 oz (44.9 kg)  02/01/21 105 lb (47.6 kg)  01/26/21 101 lb 6.4 oz (46 kg)     GEN:  Well nourished, well developed in no acute distress HEENT: Normal NECK: No JVD; No carotid bruits CARDIAC: RRR, no murmurs, rubs, gallops Vasc- 2+ BL radial RESPIRATORY:  Clear to auscultation without rales, wheezing or  rhonchi  ABDOMEN: Soft, non-tender, non-distended MUSCULOSKELETAL:  No edema; No deformity  SKIN: Warm and dry NEUROLOGIC:  Alert and oriented x 3 PSYCHIATRIC:  Normal affect   ASSESSMENT:    #CVD Risk: Her risk is intermediate 13%.Marland Kitchen CAC is low. Agree with statin therapy with persistent smoking. She had q waves on her EKG. LAD dx was not significant on CTA. We discussed smoking cessation and prescribed nicotine patches.  She is asymptomatic. Will have follow up  be on an as needed basis.   PLAN:    In order of problems listed above:  Nicotine patch PRN follow up        Medication Adjustments/Labs and Tests Ordered: Current medicines are reviewed at length with the patient today.  Concerns regarding medicines are outlined above.    Signed, Janina Mayo, MD  04/29/2021 9:12 PM    [facility]

## 2021-04-30 ENCOUNTER — Encounter: Payer: Self-pay | Admitting: Internal Medicine

## 2021-04-30 ENCOUNTER — Other Ambulatory Visit: Payer: Self-pay

## 2021-04-30 ENCOUNTER — Ambulatory Visit (INDEPENDENT_AMBULATORY_CARE_PROVIDER_SITE_OTHER): Payer: Medicare HMO | Admitting: Internal Medicine

## 2021-04-30 VITALS — BP 128/60 | HR 75 | Resp 20 | Ht 65.0 in | Wt 100.6 lb

## 2021-04-30 DIAGNOSIS — R079 Chest pain, unspecified: Secondary | ICD-10-CM

## 2021-04-30 MED ORDER — NICOTINE 14 MG/24HR TD PT24: 14.0000 mg | MEDICATED_PATCH | Freq: Every day | TRANSDERMAL | 0 refills | Status: DC

## 2021-04-30 NOTE — Patient Instructions (Signed)
Medication Instructions:  Start nicotine patches: 14 mg x 4 weeks, then decrease to 7 mg x 2-4 weeks.    *If you need a refill on your cardiac medications before your next appointment, please call your pharmacy*  Follow-Up: At [facility], you and your health needs are our priority.  As part of our continuing mission to provide you with exceptional heart care, we have created designated Provider Care Teams.  These Care Teams include your primary Cardiologist (physician) and Advanced Practice Providers (APPs -  Physician Assistants and Nurse Practitioners) who all work together to provide you with the care you need, when you need it.  We recommend signing up for the patient portal called "MyChart".  Sign up information is provided on this After Visit Summary.  MyChart is used to connect with patients for Virtual Visits (Telemedicine).  Patients are able to view lab/test results, encounter notes, upcoming appointments, etc.  Non-urgent messages can be sent to your provider as well.   To learn more about what you can do with MyChart, go to [email or web address].    Your next appointment:   AS NEEDED

## 2021-05-03 ENCOUNTER — Other Ambulatory Visit: Payer: Self-pay

## 2021-05-17 ENCOUNTER — Ambulatory Visit: Payer: Medicare HMO | Admitting: Family Medicine

## 2021-05-18 ENCOUNTER — Encounter: Payer: Self-pay | Admitting: Emergency Medicine

## 2021-05-18 ENCOUNTER — Ambulatory Visit
Admission: EM | Admit: 2021-05-18 | Discharge: 2021-05-18 | Disposition: A | Discharge status: Home / Self Care | Payer: Medicare HMO | Attending: Family Medicine | Admitting: Family Medicine

## 2021-05-18 DIAGNOSIS — Z1152 Encounter for screening for COVID-19: Secondary | ICD-10-CM | POA: Diagnosis not present

## 2021-05-18 DIAGNOSIS — J209 Acute bronchitis, unspecified: Secondary | ICD-10-CM | POA: Diagnosis not present

## 2021-05-18 DIAGNOSIS — J019 Acute sinusitis, unspecified: Secondary | ICD-10-CM

## 2021-05-18 MED ORDER — AZITHROMYCIN 250 MG PO TABS: ORAL_TABLET | ORAL | 0 refills | Status: DC

## 2021-05-18 MED ORDER — PROMETHAZINE-DM 6.25-15 MG/5ML PO SYRP: 5.0000 mL | ORAL_SOLUTION | Freq: Three times a day (TID) | ORAL | 0 refills | Status: DC | PRN

## 2021-05-18 MED ORDER — PREDNISONE 20 MG PO TABS: 40.0000 mg | ORAL_TABLET | Freq: Every day | ORAL | 0 refills | Status: DC

## 2021-05-18 NOTE — ED Provider Notes (Signed)
Sierra Brown    CSN: [id number] Arrival date & time: 05/18/21  1021      History   Chief Complaint Chief Complaint  Patient presents with   Nasal Congestion   Cough   Hoarse    HPI Sierra Brown is a 70 y.o. female.   HPI Patient with a medical history of COPD and previous pneumonia presents today for evaluation of persistent cough, 2 days of shortness of breath, generalized fatigue and hoarseness. No known sick contacts. She denies fever. Uses prescribed maintenance inhaler as prescribed. Past Medical History:  Diagnosis Date   Allergy    allerlgic rhinitis   Cataract    COPD (chronic obstructive pulmonary disease) ([place])    Pneumonia    Tobacco use disorder     Patient Active Problem List   Diagnosis Date Noted   Chest pain 02/14/2021   Insomnia 01/26/2021   Anxiety about health 01/26/2021   Vitamin D deficiency 01/20/2021   B12 deficiency 01/20/2021   Hypertension 01/20/2021   Hyperlipidemia 01/20/2021   Coronary artery calcification seen on CT scan 09/16/2020   Dental infection 08/05/2020   Nausea 08/05/2020   Screening for blood or protein in urine 08/05/2020   Tick bite 08/05/2020   Palpitations 08/05/2020   Bruit of left carotid artery 08/05/2020   History of palpitations in adulthood 08/05/2020   Hematuria 08/05/2020   CERUMEN IMPACTION, BILATERAL 08/15/2008   URI 01/21/2008   SINUSITIS- ACUTE-NOS 08/30/2007   Tobacco abuse 05/25/2007   Allergic rhinitis 10/24/2006    Past Surgical History:  Procedure Laterality Date   CESAREAN SECTION     TONSILLECTOMY      OB History   No obstetric history on file.      Home Medications    Prior to Admission medications   Medication Sig Start Date End Date Taking? Authorizing Provider  acetaminophen (TYLENOL) 500 MG tablet Take 500-1,000 mg by mouth every 6 (six) hours as needed for headache (pain).   Yes [provider]  aspirin EC 81 MG tablet Take 1 tablet (81 mg total) by  mouth daily. Swallow whole. 08/05/20  Yes Flinchum, Sierra Aline, FNP  Loratadine 10 MG CAPS Take 10 mg by mouth daily as needed (seasonal allergies).   Yes [provider]  nicotine (NICODERM CQ - DOSED IN MG/24 HOURS) 14 mg/24hr patch Place 1 patch (14 mg total) onto the skin daily. 04/30/21  Yes BranchRoyetta Crochet, MD  nitroGLYCERIN (NITROSTAT) 0.4 MG SL tablet Place 1 tablet (0.4 mg total) under the tongue every 5 (five) minutes x 3 doses as needed for chest pain. 02/15/21  Yes Sierra Billet, NP  rosuvastatin (CRESTOR) 5 MG tablet TAKE 1 TABLET(5 MG) BY MOUTH DAILY 03/29/21  Yes Sierra Brown, Sierra Bucy, MD  traZODone (DESYREL) 50 MG tablet Take 0.5-1 tablets (25-50 mg total) by mouth at bedtime as needed for sleep. 01/26/21  Yes Sierra Sprout, FNP  fluticasone (FLONASE) 50 MCG/ACT nasal spray Place 1 spray into the nose daily as needed for allergies or rhinitis (seasonal allergies).    [provider]    Family History Family History  Problem Relation Age of Onset   Ulcerative colitis Mother        colostomy   Hypertension Mother    Heart Problems Mother    Breast cancer Neg Hx     Social History Social History   Tobacco Use   Smoking status: Every Day    Packs/day: 0.75    Years:  49.00    Pack years: 36.75    Types: Cigarettes   Smokeless tobacco: Never  Vaping Use   Vaping Use: Never used  Substance Use Topics   Alcohol use: Never   Drug use: Never     Allergies   Atorvastatin, Codeine, Levofloxacin, Sulfonamide derivatives, Penicillins, and Sulfa antibiotics   Review of Systems Review of Systems Pertinent negatives listed in HPI  Physical Exam Triage Vital Signs ED Triage Vitals  Enc Vitals Group     BP 05/18/21 1034 (!) 149/73     Pulse Rate 05/18/21 1034 74     Resp --      Temp 05/18/21 1034 98 F (36.7 C)     Temp Source 05/18/21 1034 Oral     SpO2 05/18/21 1034 98 %     Weight --      Height --      Head Circumference --      Peak Flow --       Pain Score 05/18/21 1033 0     Pain Loc --      Pain Edu? --      Excl. in GC? --    No data found.  Updated Vital Signs BP (!) 149/73 (BP Location: Left Arm)   Pulse 74   Temp 98 F (36.7 C) (Oral)   SpO2 98%   Visual Acuity Right Eye Distance:   Left Eye Distance:   Bilateral Distance:    Right Eye Near:   Left Eye Near:    Bilateral Near:     Physical Exam  General Appearance:    Alert, acutely ill appearing, cooperative, no distress  HENT:   Normocephalic, ears normal, nares mucosal edema with congestion, rhinorrhea, oropharynx  clear   Eyes:    PERRL, conjunctiva/corneas clear, EOM's intact       Lungs:     Coarse lung sound to auscultation bilaterally, expiratory wheeze present, respirations unlabored  Heart:    Regular rate and rhythm  Neurologic:   Awake, alert, oriented x 3. No apparent focal neurological           defect.      UC Treatments / Results  Labs (all labs ordered are listed, but only abnormal results are displayed) Labs Reviewed - No data to display  EKG   Radiology No results found.  Procedures Procedures (including critical care time)  Medications Ordered in UC Medications - No data to display  Initial Impression / Assessment and Plan / UC Course  I have reviewed the triage vital signs and the nursing notes.  Pertinent labs & imaging results that were available during my care of the patient were reviewed by me and considered in my medical decision making (see chart for details).    Acute bronchitis with acute sinusitis  Treatment per discharge medications orders Red flag precautions discussed RTC PRN Final Clinical Impressions(s) / UC Diagnoses   Final diagnoses:  Acute bronchitis, unspecified organism  Encounter for screening for COVID-19  Acute non-recurrent sinusitis, unspecified location     Discharge Instructions      Your COVID / Flu  results should result within 3-5 days. Negative results are immediately resulted  to Mychart. Positive results will receive a follow-up call from our clinic. If symptoms are present, I recommend home quarantine until results are known.  Alternate Tylenol and ibuprofen as needed for body aches and fever.  Symptom management per recommendations discussed today.  If any breathing difficulty or chest pain develops  go immediately to the closest emergency department for evaluation.     ED Prescriptions     Medication Sig Dispense Auth. Provider   predniSONE (DELTASONE) 20 MG tablet Take 2 tablets (40 mg total) by mouth daily with breakfast. 10 tablet Scot Jun, FNP   promethazine-dextromethorphan (PROMETHAZINE-DM) 6.25-15 MG/5ML syrup Take 5 mLs by mouth 3 (three) times daily as needed for cough. 140 mL Scot Jun, FNP   azithromycin (ZITHROMAX) 250 MG tablet Take 2 tabs PO x 1 dose, then 1 tab PO QD x 4 days 6 tablet Scot Jun, FNP      PDMP not reviewed this encounter.   Scot Jun, FNP 05/23/21 [phone]

## 2021-05-18 NOTE — ED Triage Notes (Signed)
Pt c/o cough, runny nose, hoarse, and fever blister under her nose x 2 days.

## 2021-05-18 NOTE — Discharge Instructions (Addendum)
Your COVID/Flu results should result within 3-5 days. °Negative results are immediately resulted to Mychart. Positive results will receive a follow-up call from our clinic. If symptoms are present, I recommend home quarantine until results are known.  °Alternate Tylenol and ibuprofen as needed for body aches and fever.  Symptom management per recommendations discussed today.  If any breathing difficulty or chest pain develops go immediately to the closest emergency department for evaluation.  °

## 2021-05-20 LAB — COVID-19, FLU A+B NAA
Influenza A, NAA: NOT DETECTED
Influenza B, NAA: NOT DETECTED
SARS-CoV-2, NAA: NOT DETECTED

## 2021-06-02 ENCOUNTER — Other Ambulatory Visit: Payer: Self-pay

## 2021-06-02 ENCOUNTER — Encounter: Payer: Self-pay | Admitting: Family Medicine

## 2021-06-02 ENCOUNTER — Ambulatory Visit (INDEPENDENT_AMBULATORY_CARE_PROVIDER_SITE_OTHER): Payer: Medicare HMO | Admitting: Family Medicine

## 2021-06-02 VITALS — BP 112/60 | HR 83 | Resp 15 | Wt 102.3 lb

## 2021-06-02 DIAGNOSIS — J00 Acute nasopharyngitis [common cold]: Secondary | ICD-10-CM | POA: Insufficient documentation

## 2021-06-02 DIAGNOSIS — R52 Pain, unspecified: Secondary | ICD-10-CM | POA: Insufficient documentation

## 2021-06-02 DIAGNOSIS — I10 Essential (primary) hypertension: Secondary | ICD-10-CM | POA: Diagnosis not present

## 2021-06-02 DIAGNOSIS — Z720 Tobacco use: Secondary | ICD-10-CM

## 2021-06-02 NOTE — Assessment & Plan Note (Signed)
LCTAB; no wheeze Continue to use OTC products to assist with management Oral airway shows to redness/swelling/exudate Patient denies difficulty sleeping Minimal cough Education provided- do no believe patient needs xray at this time

## 2021-06-02 NOTE — Progress Notes (Signed)
Established patient visit   Patient: Sierra Brown   DOB: 05/07/1951   70 y.o. Female  MRN: [id number] Visit Date: 06/02/2021  Today's healthcare provider: Jacky Kindle, FNP   Chief Complaint  Patient presents with   Hypertension   Insomnia   Subjective    HPI  Hypertension, follow-up  BP Readings from Last 3 Encounters:  06/02/21 112/60  05/18/21 (!) 149/73  04/30/21 128/60   Wt Readings from Last 3 Encounters:  06/02/21 102 lb 4.8 oz (46.4 kg)  04/30/21 100 lb 9.6 oz (45.6 kg)  02/15/21 98 lb 14.4 oz (44.9 kg)     She was last seen for hypertension 3 months ago.  BP at that visit was 146/59. Management since that visit includes none.  She reports fair compliance with treatment.Patient reports at hospital visit on 02/14/21 she states that cardiologist d/c blood pressure medication She is not having side effects.  She is following a Regular diet. She is exercising. She does not smoke.  Use of agents associated with hypertension: NSAIDS.   Outside blood pressures are recent reading 112/60. Symptoms: No chest pain No chest pressure  No palpitations No syncope  No dyspnea No orthopnea  No paroxysmal nocturnal dyspnea No lower extremity edema   Pertinent labs: Lab Results  Component Value Date   CHOL 153 01/20/2021   HDL 60 01/20/2021   LDLCALC 73 01/20/2021   TRIG 109 01/20/2021   CHOLHDL 2.6 01/20/2021   Lab Results  Component Value Date   NA 138 02/15/2021   K 4.2 02/15/2021   CREATININE 0.83 02/15/2021   GFRNONAA >60 02/15/2021   GLUCOSE 91 02/15/2021   TSH 1.250 01/20/2021     The 10-year ASCVD risk score (Arnett DK, et al., 2019) is: 8.9%   ---------------------------------------------------------------------------------------------------  Follow up for Insomnia  The patient was last seen for this 3 months ago. Changes made at last visit include started on Trazodone 50mg  .  She reports fair compliance with treatment. She feels that  condition is Unchanged. She is not having side effects.   -----------------------------------------------------------------------------------------   Medications: Outpatient Medications Prior to Visit  Medication Sig   aspirin EC 81 MG tablet Take 1 tablet (81 mg total) by mouth daily. Swallow whole.   rosuvastatin (CRESTOR) 5 MG tablet TAKE 1 TABLET(5 MG) BY MOUTH DAILY   promethazine-dextromethorphan (PROMETHAZINE-DM) 6.25-15 MG/5ML syrup Take 5 mLs by mouth 3 (three) times daily as needed for cough. (Patient not taking: Reported on 06/02/2021)   [DISCONTINUED] acetaminophen (TYLENOL) 500 MG tablet Take 500-1,000 mg by mouth every 6 (six) hours as needed for headache (pain). (Patient not taking: Reported on 06/02/2021)   [DISCONTINUED] azithromycin (ZITHROMAX) 250 MG tablet Take 2 tabs PO x 1 dose, then 1 tab PO QD x 4 days   [DISCONTINUED] fluticasone (FLONASE) 50 MCG/ACT nasal spray Place 1 spray into the nose daily as needed for allergies or rhinitis (seasonal allergies). (Patient not taking: Reported on 06/02/2021)   [DISCONTINUED] Loratadine 10 MG CAPS Take 10 mg by mouth daily as needed (seasonal allergies). (Patient not taking: Reported on 06/02/2021)   [DISCONTINUED] nicotine (NICODERM CQ - DOSED IN MG/24 HOURS) 14 mg/24hr patch Place 1 patch (14 mg total) onto the skin daily. (Patient not taking: Reported on 06/02/2021)   [DISCONTINUED] nitroGLYCERIN (NITROSTAT) 0.4 MG SL tablet Place 1 tablet (0.4 mg total) under the tongue every 5 (five) minutes x 3 doses as needed for chest pain. (Patient not taking: Reported on 06/02/2021)   [  DISCONTINUED] predniSONE (DELTASONE) 20 MG tablet Take 2 tablets (40 mg total) by mouth daily with breakfast.   [DISCONTINUED] traZODone (DESYREL) 50 MG tablet Take 0.5-1 tablets (25-50 mg total) by mouth at bedtime as needed for sleep. (Patient not taking: Reported on 06/02/2021)   No facility-administered medications prior to visit.    Review of Systems      Objective    BP 112/60 Comment: home reading, this am  Pulse 83   Resp 15   Wt 102 lb 4.8 oz (46.4 kg)   SpO2 100%   BMI 17.02 kg/m    Physical Exam Vitals and nursing note reviewed.  Constitutional:      General: She is not in acute distress.    Appearance: Normal appearance. She is underweight. She is not ill-appearing, toxic-appearing or diaphoretic.  HENT:     Head: Normocephalic and atraumatic.  Cardiovascular:     Rate and Rhythm: Normal rate and regular rhythm.     Pulses: Normal pulses.     Heart sounds: Normal heart sounds. No murmur heard.   No friction rub. No gallop.  Pulmonary:     Effort: Pulmonary effort is normal. No respiratory distress.     Breath sounds: Normal breath sounds. No stridor. No wheezing, rhonchi or rales.  Chest:     Chest wall: No tenderness.  Abdominal:     General: Bowel sounds are normal.     Palpations: Abdomen is soft.  Musculoskeletal:        General: No swelling, tenderness, deformity or signs of injury. Normal range of motion.     Right lower leg: No edema.     Left lower leg: No edema.  Skin:    General: Skin is warm and dry.     Capillary Refill: Capillary refill takes less than 2 seconds.     Coloration: Skin is not jaundiced or pale.     Findings: No bruising, erythema, lesion or rash.  Neurological:     General: No focal deficit present.     Mental Status: She is alert and oriented to person, place, and time. Mental status is at baseline.     Cranial Nerves: No cranial nerve deficit.     Sensory: No sensory deficit.     Motor: No weakness.     Coordination: Coordination normal.  Psychiatric:        Mood and Affect: Mood normal.        Behavior: Behavior normal.        Thought Content: Thought content normal.        Judgment: Judgment normal.    No results found for any visits on 06/02/21.  Assessment & Plan     Problem List Items Addressed This Visit       Cardiovascular and Mediastinum   Hypertension - Primary     Elevated in office, normal at home Was seen in cardiology; discontinued lisinopril based on ECHO DASH diet reviewed        Respiratory   Acute nasopharyngitis    LCTAB; no wheeze Continue to use OTC products to assist with management Oral airway shows to redness/swelling/exudate Patient denies difficulty sleeping Minimal cough Education provided- do no believe patient needs xray at this time        Other   Tobacco abuse    Continues to smoke 2 cigarettes/day; advised that cold like symptoms may be lingering d/t tobacco scarring/lung damage and continue to advise cessation Patient denies assistance at this time  Generalized body aches    Noticed in the evening hours; non-febrile Aware of for 2-3 weeks, since cold symptoms Discussed possible hydration relation Recommend CoQ enzymes to assist with side effects from cholesterol medication given previous complications with use of lipitor         Return in about 2 months (around 08/03/2021) for chonic disease management, annual examination.      Leilani Merl, FNP, have reviewed all documentation for this visit. The documentation on 06/02/21 for the exam, diagnosis, procedures, and orders are all accurate and complete.    Jacky Kindle, FNP  [facility] [phone] (phone) [phone] (fax)  [facility]

## 2021-06-02 NOTE — Assessment & Plan Note (Signed)
Continues to smoke 2 cigarettes/day; advised that cold like symptoms may be lingering d/t tobacco scarring/lung damage and continue to advise cessation Patient denies assistance at this time

## 2021-06-02 NOTE — Assessment & Plan Note (Signed)
Elevated in office, normal at home Was seen in cardiology; discontinued lisinopril based on ECHO DASH diet reviewed

## 2021-06-02 NOTE — Assessment & Plan Note (Signed)
Noticed in the evening hours; non-febrile Aware of for 2-3 weeks, since cold symptoms Discussed possible hydration relation Recommend CoQ enzymes to assist with side effects from cholesterol medication given previous complications with use of lipitor

## 2021-06-15 ENCOUNTER — Ambulatory Visit: Payer: Self-pay | Admitting: *Deleted

## 2021-06-15 ENCOUNTER — Other Ambulatory Visit: Payer: Self-pay | Admitting: Family Medicine

## 2021-06-15 ENCOUNTER — Telehealth: Payer: Self-pay | Admitting: Family Medicine

## 2021-06-15 DIAGNOSIS — R0602 Shortness of breath: Secondary | ICD-10-CM

## 2021-06-15 MED ORDER — AEROCHAMBER MV MISC: 2 refills | Status: DC

## 2021-06-15 MED ORDER — ALBUTEROL SULFATE HFA 108 (90 BASE) MCG/ACT IN AERS
2.0000 | INHALATION_SPRAY | Freq: Four times a day (QID) | RESPIRATORY_TRACT | 2 refills | Status: DC | PRN
Start: 2021-06-15 — End: 2022-06-03

## 2021-06-15 NOTE — Telephone Encounter (Signed)
Pt called and stated when she was seen last Robynn Pane suggested an inhaler/ pt is wanted to get the Rx for an inhaler sent asap to  [id number] [place], [place] - [address] AT [facility] [address] Phone:  [phone]  Fax:  [phone]

## 2021-06-15 NOTE — Telephone Encounter (Signed)
Reason for Disposition  [1] MILD difficulty breathing (e.g., minimal/no SOB at rest, SOB with walking, pulse <100) AND [2] NEW-onset or WORSE than normal  Answer Assessment - Initial Assessment Questions 1. RESPIRATORY STATUS: "Describe your breathing?" (e.g., wheezing, shortness of breath, unable to speak, severe coughing)      SOB- fatigued at time, chest tightness 2. ONSET: "When did this breathing problem begin?"      Recent bronchitis- treated with Z-pack, prednisone, cough syrup- symptoms improved- but still has tightness in chest 3. PATTERN "Does the difficult breathing come and go, or has it been constant since it started?"      Comes and goes 4. SEVERITY: "How bad is your breathing?" (e.g., mild, moderate, severe)    - MILD: No SOB at rest, mild SOB with walking, speaks normally in sentences, can lie down, no retractions, pulse < 100.    - MODERATE: SOB at rest, SOB with minimal exertion and prefers to sit, cannot lie down flat, speaks in phrases, mild retractions, audible wheezing, pulse 100-120.    - SEVERE: Very SOB at rest, speaks in single words, struggling to breathe, sitting hunched forward, retractions, pulse > 120      mild 5. RECURRENT SYMPTOM: "Have you had difficulty breathing before?" If Yes, ask: "When was the last time?" and "What happened that time?"      No- not that lasted- worse during cold months  6. CARDIAC HISTORY: "Do you have any history of heart disease?" (e.g., heart attack, angina, bypass surgery, angioplasty)      Hx high BP 7. LUNG HISTORY: "Do you have any history of lung disease?"  (e.g., pulmonary embolus, asthma, emphysema)     COPD 8. CAUSE: "What do you think is causing the breathing problem?"      Coughs occasionally- has mucus 9. OTHER SYMPTOMS: "Do you have any other symptoms? (e.g., dizziness, runny nose, cough, chest pain, fever)     no 10. O2 SATURATION MONITOR:  "Do you use an oxygen saturation monitor (pulse oximeter) at home?" If Yes,  "What is your reading (oxygen level) today?" "What is your usual oxygen saturation reading?" (e.g., 95%)       no 11. PREGNANCY: "Is there any chance you are pregnant?" "When was your last menstrual period?"       no 12. TRAVEL: "Have you traveled out of the country in the last month?" (e.g., travel history, exposures)       no  Protocols used: Breathing Difficulty-A-AH

## 2021-06-15 NOTE — Telephone Encounter (Signed)
°  Chief Complaint: medication request, SOB same Symptoms: SOB at times Frequency: last seen 12/7 Pertinent Negatives: Patient denies worsening symptoms, fever Disposition: [] ED /[] Urgent Care (no appt availability in office) / [] Appointment(In office/virtual)/ []  [facility]/ [] Home Care/ [] Refused Recommended Disposition  Additional Notes: Patient was seen on 12/7 and advised to message PCP for inhaler if she felt she needed. Patient states she is not better- but not worse with her breathing and would like to try inhaler. Patient advised I would send message to PCP

## 2021-06-15 NOTE — Telephone Encounter (Signed)
Please advise 

## 2021-08-10 ENCOUNTER — Encounter: Payer: Medicare HMO | Admitting: Family Medicine

## 2021-08-25 ENCOUNTER — Encounter: Payer: Self-pay | Admitting: Family Medicine

## 2021-08-25 ENCOUNTER — Other Ambulatory Visit: Payer: Self-pay

## 2021-08-25 ENCOUNTER — Ambulatory Visit (INDEPENDENT_AMBULATORY_CARE_PROVIDER_SITE_OTHER): Payer: Medicare HMO | Admitting: Family Medicine

## 2021-08-25 VITALS — BP 128/69 | HR 73 | Temp 97.3°F | Resp 16 | Ht 65.0 in | Wt 105.7 lb

## 2021-08-25 DIAGNOSIS — Z13820 Encounter for screening for osteoporosis: Secondary | ICD-10-CM | POA: Diagnosis not present

## 2021-08-25 DIAGNOSIS — Z1211 Encounter for screening for malignant neoplasm of colon: Secondary | ICD-10-CM | POA: Diagnosis not present

## 2021-08-25 DIAGNOSIS — I10 Essential (primary) hypertension: Secondary | ICD-10-CM

## 2021-08-25 DIAGNOSIS — Z8679 Personal history of other diseases of the circulatory system: Secondary | ICD-10-CM | POA: Insufficient documentation

## 2021-08-25 DIAGNOSIS — Z13220 Encounter for screening for lipoid disorders: Secondary | ICD-10-CM | POA: Insufficient documentation

## 2021-08-25 DIAGNOSIS — Z122 Encounter for screening for malignant neoplasm of respiratory organs: Secondary | ICD-10-CM | POA: Diagnosis not present

## 2021-08-25 DIAGNOSIS — R636 Underweight: Secondary | ICD-10-CM | POA: Diagnosis not present

## 2021-08-25 DIAGNOSIS — Z720 Tobacco use: Secondary | ICD-10-CM | POA: Diagnosis not present

## 2021-08-25 DIAGNOSIS — R002 Palpitations: Secondary | ICD-10-CM | POA: Diagnosis not present

## 2021-08-25 DIAGNOSIS — Z1231 Encounter for screening mammogram for malignant neoplasm of breast: Secondary | ICD-10-CM | POA: Insufficient documentation

## 2021-08-25 DIAGNOSIS — Z23 Encounter for immunization: Secondary | ICD-10-CM | POA: Diagnosis not present

## 2021-08-25 DIAGNOSIS — Z0000 Encounter for general adult medical examination without abnormal findings: Secondary | ICD-10-CM

## 2021-08-25 DIAGNOSIS — J301 Allergic rhinitis due to pollen: Secondary | ICD-10-CM | POA: Diagnosis not present

## 2021-08-25 DIAGNOSIS — I700 Atherosclerosis of aorta: Secondary | ICD-10-CM

## 2021-08-25 DIAGNOSIS — Z136 Encounter for screening for cardiovascular disorders: Secondary | ICD-10-CM | POA: Diagnosis not present

## 2021-08-25 MED ORDER — NA SULFATE-K SULFATE-MG SULF 17.5-3.13-1.6 GM/177ML PO SOLN
1.0000 | Freq: Once | ORAL | 0 refills | Status: AC
Start: 2021-08-25 — End: 2021-08-25

## 2021-08-25 NOTE — Assessment & Plan Note (Signed)
FLP completed today ?Hx of HTN ?Hx of palpitations ?Encouraged to quit/reduce use of tobacco products ?

## 2021-08-25 NOTE — Assessment & Plan Note (Signed)
UTD on dental ?UTD on vision ?Denies need for controller inhaler at this time ?Things to do to keep yourself healthy  ?- Exercise at least 30-45 minutes a day, 3-4 days a week.  ?- Eat a low-fat diet with lots of fruits and vegetables, up to 7-9 servings per day.  ?- Seatbelts can save your life. Wear them always.  ?- Smoke detectors on every level of your home, check batteries every year.  ?- Eye Doctor - have an eye exam every 1-2 years  ?- Safe sex - if you may be exposed to STDs, use a condom.  ?- Alcohol -  If you drink, do it moderately, less than 2 drinks per day.  ?- Health Care Power of [place]. Choose someone to speak for you if you are not able.  ?- Depression is common in our stressful world.If you're feeling down or losing interest in things you normally enjoy, please come in for a visit.  ?- Violence - If anyone is threatening or hurting you, please call immediately. ? ? ?

## 2021-08-25 NOTE — Assessment & Plan Note (Signed)
BMI today- 17.59 ?Continue to recommend balanced diet ?Pt is noted to have gradual weight gain since last appt- congratulated on improvement ?

## 2021-08-25 NOTE — Assessment & Plan Note (Signed)
Continues to smoke 2 cigarettes/day; continue to recommend reduction and cessation as all tobacco use can lead to lung scarring/lung damage  ?Patient denies assistance at this time ?

## 2021-08-25 NOTE — Assessment & Plan Note (Signed)
Denies exam in office today ?Reports no complaints during BSE at home ?Referral for mammography at this time ?

## 2021-08-25 NOTE — Assessment & Plan Note (Signed)
Bps previously elevated in office and normal at home ?Has been seen at cardiology who discontinued lisinopril based on ECHO findings ?DASH diet reinforced ?Smoking cessation encouraged ?

## 2021-08-25 NOTE — Assessment & Plan Note (Signed)
Complaints of today ?Encourage OTC medication to assist ?Has been taking claritin- recommend switch to zyrtec if clartin is not helping ?Add in flonase as well ?Can refer out for allergy testing if needed ?

## 2021-08-25 NOTE — Assessment & Plan Note (Signed)
Provided today; advised patient that 2 dose series and a second vaccination will be needed to complete ?Consent obtained ?

## 2021-08-25 NOTE — Assessment & Plan Note (Signed)
Elevated in office, normal at home Was seen in cardiology; discontinued lisinopril based on ECHO DASH diet reviewed 

## 2021-08-25 NOTE — Assessment & Plan Note (Signed)
Colon cancer screening due ?Referral placed ?Discussed risk between smoking and colon cancer d/t inflammatory process ?Continue to recommend cessation and/or reduction of tobacco products ?

## 2021-08-25 NOTE — Progress Notes (Signed)
Complete physical exam   Patient: Sierra Brown   DOB: 25-Apr-1951   71 y.o. Female  MRN: [id number] Visit Date: 08/25/2021  Today's healthcare provider: Gwyneth Sprout, FNP   Chief Complaint  Patient presents with   Annual Exam   Subjective     HPI  Sierra Brown is a 71 y.o. female who presents today for a complete physical exam.  She reports consuming a general diet. Home exercise routine includes walking around neighborhood. She generally feels well. She reports sleeping fairly well. She does not have additional problems to discuss today. Patient has declined shingles vaccine.   Introduced to Designer, jewellery role and practice setting.  All questions answered.  Discussed provider/patient relationship and expectations.   Past Medical History:  Diagnosis Date   Allergy    allerlgic rhinitis   Cataract    COPD (chronic obstructive pulmonary disease) ([place])    Pneumonia    Tobacco use disorder    Past Surgical History:  Procedure Laterality Date   CESAREAN SECTION     TONSILLECTOMY     Social History   Socioeconomic History   Marital status: Married    Spouse name: Not on file   Number of children: Not on file   Years of education: Not on file   Highest education level: Not on file  Occupational History   Not on file  Tobacco Use   Smoking status: Every Day    Packs/day: 0.75    Years: 49.00    Pack years: 36.75    Types: Cigarettes   Smokeless tobacco: Never  Vaping Use   Vaping Use: Never used  Substance and Sexual Activity   Alcohol use: Never   Drug use: Never   Sexual activity: Not on file  Other Topics Concern   Not on file  Social History Narrative   Not on file   Social Determinants of Health   Financial Resource Strain: Not on file  Food Insecurity: Not on file  Transportation Needs: Not on file  Physical Activity: Not on file  Stress: Not on file  Social Connections: Not on file  Intimate Partner Violence: Not on file    Family Status  Relation Name Status   Mother  (Not Specified)       colostomy   Neg Hx  (Not Specified)   Family History  Problem Relation Age of Onset   Ulcerative colitis Mother        colostomy   Hypertension Mother    Heart Problems Mother    Breast cancer Neg Hx    Allergies  Allergen Reactions   Atorvastatin Other (See Comments)    Myalgias   Codeine Nausea And Vomiting   Levofloxacin Nausea And Vomiting   Sulfonamide Derivatives Hives   Penicillins Hives and Rash   Sulfa Antibiotics Hives and Rash    Patient Care Team: Gwyneth Sprout, FNP as PCP - General (Family Medicine) Pixie Casino, MD as PCP - Cardiology (Cardiology)   Medications: Outpatient Medications Prior to Visit  Medication Sig   [DISCONTINUED] aspirin EC 81 MG tablet Take 1 tablet (81 mg total) by mouth daily. Swallow whole.   albuterol (VENTOLIN HFA) 108 (90 Base) MCG/ACT inhaler Inhale 2 puffs into the lungs every 6 (six) hours as needed for wheezing or shortness of breath. (Patient not taking: Reported on 08/25/2021)   rosuvastatin (CRESTOR) 5 MG tablet TAKE 1 TABLET(5 MG) BY MOUTH DAILY (Patient not taking: Reported on 08/25/2021)  Spacer/Aero-Holding Chambers (AEROCHAMBER MV) inhaler Use as instructed (Patient not taking: Reported on 08/25/2021)   [DISCONTINUED] promethazine-dextromethorphan (PROMETHAZINE-DM) 6.25-15 MG/5ML syrup Take 5 mLs by mouth 3 (three) times daily as needed for cough. (Patient not taking: Reported on 06/02/2021)   No facility-administered medications prior to visit.    Review of Systems  HENT:  Positive for rhinorrhea.   All other systems reviewed and are negative.    Objective    BP 128/69    Pulse 73    Temp (!) 97.3 F (36.3 C) (Oral)    Resp 16    Ht 5\' 5"  (1.651 m)    Wt 105 lb 11.2 oz (47.9 kg)    SpO2 99%    BMI 17.59 kg/m     Physical Exam Vitals and nursing note reviewed.  Constitutional:      General: She is awake. She is not in acute distress.     Appearance: Normal appearance. She is well-developed, well-groomed and underweight. She is not ill-appearing, toxic-appearing or diaphoretic.  HENT:     Head: Normocephalic and atraumatic.     Jaw: There is normal jaw occlusion. No trismus, tenderness, swelling or pain on movement.     Right Ear: Hearing, tympanic membrane, ear canal and external ear normal. There is no impacted cerumen.     Left Ear: Hearing, tympanic membrane, ear canal and external ear normal. There is no impacted cerumen.     Nose: Nose normal. No congestion or rhinorrhea.     Right Turbinates: Not enlarged, swollen or pale.     Left Turbinates: Not enlarged, swollen or pale.     Right Sinus: No maxillary sinus tenderness or frontal sinus tenderness.     Left Sinus: No maxillary sinus tenderness or frontal sinus tenderness.     Mouth/Throat:     Lips: Pink.     Mouth: Mucous membranes are moist. No injury.     Tongue: No lesions.     Pharynx: Oropharynx is clear. Uvula midline. No pharyngeal swelling, oropharyngeal exudate, posterior oropharyngeal erythema or uvula swelling.     Tonsils: No tonsillar exudate or tonsillar abscesses.  Eyes:     General: Lids are normal. Lids are everted, no foreign bodies appreciated. Vision grossly intact. Gaze aligned appropriately. No allergic shiner or visual field deficit.       Right eye: No discharge.        Left eye: No discharge.     Extraocular Movements: Extraocular movements intact.     Conjunctiva/sclera: Conjunctivae normal.     Right eye: Right conjunctiva is not injected. No exudate.    Left eye: Left conjunctiva is not injected. No exudate.    Pupils: Pupils are equal, round, and reactive to light.  Neck:     Thyroid: No thyroid mass, thyromegaly or thyroid tenderness.     Vascular: No carotid bruit.     Trachea: Trachea normal.  Cardiovascular:     Rate and Rhythm: Normal rate and regular rhythm.     Pulses: Normal pulses.          Carotid pulses are 2+ on the  right side and 2+ on the left side.      Radial pulses are 2+ on the right side and 2+ on the left side.       Dorsalis pedis pulses are 2+ on the right side and 2+ on the left side.       Posterior tibial pulses are 2+ on the right side and  2+ on the left side.     Heart sounds: Normal heart sounds, S1 normal and S2 normal. No murmur heard.   No friction rub. No gallop.  Pulmonary:     Effort: Pulmonary effort is normal. No respiratory distress.     Breath sounds: Normal breath sounds and air entry. No stridor. No wheezing, rhonchi or rales.  Chest:     Chest wall: No tenderness.     Comments: Breast exam deferred; discussed self exam and screening mammography Abdominal:     General: Abdomen is flat. Bowel sounds are normal. There is no distension.     Palpations: Abdomen is soft. There is no mass.     Tenderness: There is no abdominal tenderness. There is no right CVA tenderness, left CVA tenderness, guarding or rebound.     Hernia: No hernia is present.  Genitourinary:    Comments: Exam deferred; denies complaints Musculoskeletal:        General: No swelling, tenderness, deformity or signs of injury. Normal range of motion.     Cervical back: Full passive range of motion without pain, normal range of motion and neck supple. No edema, rigidity or tenderness. No muscular tenderness.     Right lower leg: No edema.     Left lower leg: No edema.  Lymphadenopathy:     Cervical: No cervical adenopathy.     Right cervical: No superficial, deep or posterior cervical adenopathy.    Left cervical: No superficial, deep or posterior cervical adenopathy.  Skin:    General: Skin is warm and dry.     Capillary Refill: Capillary refill takes less than 2 seconds.     Coloration: Skin is not jaundiced or pale.     Findings: No bruising, erythema, lesion or rash.  Neurological:     General: No focal deficit present.     Mental Status: She is alert and oriented to person, place, and time. Mental  status is at baseline.     GCS: GCS eye subscore is 4. GCS verbal subscore is 5. GCS motor subscore is 6.     Sensory: Sensation is intact. No sensory deficit.     Motor: Motor function is intact. No weakness.     Coordination: Coordination is intact. Coordination normal.     Gait: Gait is intact. Gait normal.  Psychiatric:        Attention and Perception: Attention and perception normal.        Mood and Affect: Mood and affect normal.        Speech: Speech normal.        Behavior: Behavior normal. Behavior is cooperative.        Thought Content: Thought content normal.        Cognition and Memory: Cognition and memory normal.        Judgment: Judgment normal.     Last depression screening scores PHQ 2/9 Scores 08/25/2021 08/05/2020  PHQ - 2 Score 0 0  PHQ- 9 Score 1 7   Last fall risk screening Fall Risk  08/25/2021  Falls in the past year? 0  Number falls in past yr: 0  Injury with Fall? 0   Last Audit-C alcohol use screening Alcohol Use Disorder Test (AUDIT) 08/25/2021  1. How often do you have a drink containing alcohol? 0  2. How many drinks containing alcohol do you have on a typical day when you are drinking? 0  3. How often do you have six or more drinks on one  occasion? 0  AUDIT-C Score 0   A score of 3 or more in women, and 4 or more in men indicates increased risk for alcohol abuse, EXCEPT if all of the points are from question 1   No results found for any visits on 08/25/21.  Assessment & Plan    Routine Health Maintenance and Physical Exam  Exercise Activities and Dietary recommendations  Goals   None     Immunization History  Administered Date(s) Administered   Influenza Whole 03/28/2007, 04/08/2010   Influenza-Unspecified 03/27/2013, 04/27/2021   Pneumococcal Polysaccharide-23 08/01/2007   Td 06/27/1997    Health Maintenance  Topic Date Due   COVID-19 Vaccine (1) Never done   COLONOSCOPY (Pts 45-71yrs Insurance coverage will need to be confirmed)   Never done   Zoster Vaccines- Shingrix (1 of 2) Never done   TETANUS/TDAP  06/28/2007   Pneumonia Vaccine 32+ Years old (2 - PCV) 07/31/2008   DEXA SCAN  Never done   MAMMOGRAM  09/02/2022   INFLUENZA VACCINE  Completed   HPV VACCINES  Aged Out   Hepatitis C Screening  Discontinued    Discussed health benefits of physical activity, and encouraged her to engage in regular exercise appropriate for her age and condition.  Problem List Items Addressed This Visit       Cardiovascular and Mediastinum   Aortic atherosclerosis ([place])    Seen on previous CT scan of chest Has been intermittently taking statin Encourage smoking cessation to assist with reduced risk of plaque buildup FLP repeated at this time      RESOLVED: Hypertension    Elevated in office, normal at home Was seen in cardiology; discontinued lisinopril based on ECHO DASH diet reviewed      Relevant Orders   Comprehensive metabolic panel     Respiratory   Allergic rhinitis    Complaints of today Encourage OTC medication to assist Has been taking claritin- recommend switch to zyrtec if clartin is not helping Add in flonase as well Can refer out for allergy testing if needed        Other   Annual physical exam - Primary    UTD on dental UTD on vision Denies need for controller inhaler at this time Things to do to keep yourself healthy  - Exercise at least 30-45 minutes a day, 3-4 days a week.  - Eat a low-fat diet with lots of fruits and vegetables, up to 7-9 servings per day.  - Seatbelts can save your life. Wear them always.  - Smoke detectors on every level of your home, check batteries every year.  - Eye Doctor - have an eye exam every 1-2 years  - Safe sex - if you may be exposed to STDs, use a condom.  - Alcohol -  If you drink, do it moderately, less than 2 drinks per day.  - [place]. Choose someone to speak for you if you are not able.  - Depression is common in our stressful  world.If you're feeling down or losing interest in things you normally enjoy, please come in for a visit.  - Violence - If anyone is threatening or hurting you, please call immediately.        Colon cancer screening    Colon cancer screening due Referral placed Discussed risk between smoking and colon cancer d/t inflammatory process Continue to recommend cessation and/or reduction of tobacco products      Relevant Orders   Ambulatory referral to  Gastroenterology   Encounter for lipid screening for cardiovascular disease    FLP completed today Hx of HTN Hx of palpitations Encouraged to quit/reduce use of tobacco products      Relevant Orders   Lipid panel   Encounter for screening for lung cancer    Chronic tobacco use, no plans to quit Due for annual low dose CT scan for lung cancer screen Order provided Continue to encourage reduction in smoking intake with ultimate goal of tobacco cessation      Relevant Orders   CT CHEST LUNG CA SCREEN LOW DOSE W/O CM   Encounter for screening for osteoporosis    DEXA screening ordered Walking encouraged Calcium and Vit D encouraged Encouraged to reduce smoking intake with goal of cessation      Relevant Orders   DG Bone Density   History of hypertension    Bps previously elevated in office and normal at home Has been seen at cardiology who discontinued lisinopril based on ECHO findings DASH diet reinforced Smoking cessation encouraged      History of palpitations in adulthood    Chronic, improving Likely situational given improvement with reduction of stressors and substances ex caffeine/nicotine Prev seen at card and on HCTZ and ACEi with use of PRN atarax; doing well off medication today Declines effect of palpitations with ADLs      Low weight    BMI today- 17.59 Continue to recommend balanced diet Pt is noted to have gradual weight gain since last appt- congratulated on improvement      Relevant Orders   DG Bone  Density   Need for vaccination against Streptococcus pneumoniae    Provided today; advised patient that 2 dose series and a second vaccination will be needed to complete Consent obtained      Relevant Orders   Pneumococcal conjugate vaccine 20-valent   RESOLVED: Palpitations   Relevant Orders   CBC with Differential/Platelet   Screening mammogram for breast cancer    Denies exam in office today Reports no complaints during BSE at home Referral for mammography at this time      Relevant Orders   MM 3D SCREEN BREAST BILATERAL   Tobacco abuse    Continues to smoke 2 cigarettes/day; continue to recommend reduction and cessation as all tobacco use can lead to lung scarring/lung damage  Patient denies assistance at this time        Return in about 6 months (around 02/25/2022) for chonic disease management.     Vonna Kotyk, FNP, have reviewed all documentation for this visit. The documentation on 08/25/21 for the exam, diagnosis, procedures, and orders are all accurate and complete.  Fritzi Mandes [place]ford,acting as a Education administrator for Gwyneth Sprout, FNP.,have documented all relevant documentation on the behalf of Gwyneth Sprout, FNP,as directed by  Gwyneth Sprout, FNP while in the presence of Gwyneth Sprout, FNP.   Gwyneth Sprout, [place] [phone] (phone) [phone] (fax)  [place]

## 2021-08-25 NOTE — Progress Notes (Signed)
Gastroenterology Pre-Procedure Review ? ?Request Date: 10/05/2021 ?Requesting Physician: Dr. Servando Snare ? ?PATIENT REVIEW QUESTIONS: The patient responded to the following health history questions as indicated:   ? ?1. Are you having any GI issues? no ?2. Do you have a personal history of Polyps? no ?3. Do you have a family history of Colon Cancer or Polyps? yes (Mother-polyps) ?4. Diabetes Mellitus? no ?5. Joint replacements in the past 12 months?no ?6. Major health problems in the past 3 months?no ?7. Any artificial heart valves, MVP, or defibrillator?no ?   ?MEDICATIONS & ALLERGIES:    ?Patient reports the following regarding taking any anticoagulation/antiplatelet therapy:   ?Plavix, Coumadin, Eliquis, Xarelto, Lovenox, Pradaxa, Brilinta, or Effient? no ?Aspirin? yes (81 mg) ? ?Patient confirms/reports the following medications:  ?Current Outpatient Medications  ?Medication Sig Dispense Refill  ? albuterol (VENTOLIN HFA) 108 (90 Base) MCG/ACT inhaler Inhale 2 puffs into the lungs every 6 (six) hours as needed for wheezing or shortness of breath. (Patient not taking: Reported on 08/25/2021) 8 g 2  ? rosuvastatin (CRESTOR) 5 MG tablet TAKE 1 TABLET(5 MG) BY MOUTH DAILY (Patient not taking: Reported on 08/25/2021) 90 tablet 0  ? Spacer/Aero-Holding Chambers (AEROCHAMBER MV) inhaler Use as instructed (Patient not taking: Reported on 08/25/2021) 1 each 2  ? ?No current facility-administered medications for this visit.  ? ? ?Patient confirms/reports the following allergies:  ?Allergies  ?Allergen Reactions  ? Atorvastatin Other (See Comments)  ?  Myalgias  ? Codeine Nausea And Vomiting  ? Levofloxacin Nausea And Vomiting  ? Sulfonamide Derivatives Hives  ? Penicillins Hives and Rash  ? Sulfa Antibiotics Hives and Rash  ? ? ?No orders of the defined types were placed in this encounter. ? ? ?AUTHORIZATION INFORMATION ?Primary Insurance: ?1D#: ?Group #: ? ?Secondary Insurance: ?1D#: ?Group #: ? ?SCHEDULE INFORMATION: ?Date:  10/05/2021 ?Time: ?Location: [facility] ? ?

## 2021-08-25 NOTE — Assessment & Plan Note (Signed)
Chronic, improving ?Likely situational given improvement with reduction of stressors and substances ex caffeine/nicotine ?Prev seen at card and on HCTZ and ACEi with use of PRN atarax; doing well off medication today ?Declines effect of palpitations with ADLs ?

## 2021-08-25 NOTE — Assessment & Plan Note (Signed)
DEXA screening ordered ?Walking encouraged ?Calcium and Vit D encouraged ?Encouraged to reduce smoking intake with goal of cessation ?

## 2021-08-25 NOTE — Assessment & Plan Note (Signed)
Chronic tobacco use, no plans to quit ?Due for annual low dose CT scan for lung cancer screen ?Order provided ?Continue to encourage reduction in smoking intake with ultimate goal of tobacco cessation ?

## 2021-08-25 NOTE — Assessment & Plan Note (Signed)
Seen on previous CT scan of chest ?Has been intermittently taking statin ?Encourage smoking cessation to assist with reduced risk of plaque buildup ?FLP repeated at this time ?

## 2021-08-26 LAB — CBC WITH DIFFERENTIAL/PLATELET
Basophils Absolute: 0.1 10*3/uL (ref 0.0–0.2)
Basos: 1 %
EOS (ABSOLUTE): 0.2 10*3/uL (ref 0.0–0.4)
Eos: 4 %
Hematocrit: 42.5 % (ref 34.0–46.6)
Hemoglobin: 14.6 g/dL (ref 11.1–15.9)
Immature Grans (Abs): 0 10*3/uL (ref 0.0–0.1)
Immature Granulocytes: 0 %
Lymphocytes Absolute: 1.3 10*3/uL (ref 0.7–3.1)
Lymphs: 21 %
MCH: 32.3 pg (ref 26.6–33.0)
MCHC: 34.4 g/dL (ref 31.5–35.7)
MCV: 94 fL (ref 79–97)
Monocytes Absolute: 0.6 10*3/uL (ref 0.1–0.9)
Monocytes: 9 %
Neutrophils Absolute: 4.1 10*3/uL (ref 1.4–7.0)
Neutrophils: 65 %
Platelets: 267 10*3/uL (ref 150–450)
RBC: 4.52 x10E6/uL (ref 3.77–5.28)
RDW: 11.9 % (ref 11.7–15.4)
WBC: 6.3 10*3/uL (ref 3.4–10.8)

## 2021-08-26 LAB — COMPREHENSIVE METABOLIC PANEL
ALT: 11 IU/L (ref 0–32)
AST: 16 IU/L (ref 0–40)
Albumin/Globulin Ratio: 2.5 — ABNORMAL HIGH (ref 1.2–2.2)
Albumin: 4.5 g/dL (ref 3.8–4.8)
Alkaline Phosphatase: 93 IU/L (ref 44–121)
BUN/Creatinine Ratio: 19 (ref 12–28)
BUN: 14 mg/dL (ref 8–27)
Bilirubin Total: 0.5 mg/dL (ref 0.0–1.2)
CO2: 23 mmol/L (ref 20–29)
Calcium: 9.7 mg/dL (ref 8.7–10.3)
Chloride: 107 mmol/L — ABNORMAL HIGH (ref 96–106)
Creatinine, Ser: 0.75 mg/dL (ref 0.57–1.00)
Globulin, Total: 1.8 g/dL (ref 1.5–4.5)
Glucose: 97 mg/dL (ref 70–99)
Potassium: 4.8 mmol/L (ref 3.5–5.2)
Sodium: 142 mmol/L (ref 134–144)
Total Protein: 6.3 g/dL (ref 6.0–8.5)
eGFR: 87 mL/min/{1.73_m2} (ref >59)

## 2021-08-26 LAB — LIPID PANEL
Chol/HDL Ratio: 2.6 ratio (ref 0.0–4.4)
Cholesterol, Total: 170 mg/dL (ref 100–199)
HDL: 65 mg/dL (ref >39)
LDL Chol Calc (NIH): 85 mg/dL (ref 0–99)
Triglycerides: 112 mg/dL (ref 0–149)
VLDL Cholesterol Cal: 20 mg/dL (ref 5–40)

## 2021-08-27 ENCOUNTER — Other Ambulatory Visit: Payer: Self-pay | Admitting: Family Medicine

## 2021-08-27 NOTE — Telephone Encounter (Signed)
Requested Prescriptions  ?Pending Prescriptions Disp Refills  ?? rosuvastatin (CRESTOR) 5 MG tablet [Pharmacy Med Name: ROSUVASTATIN 5MG  TABLETS] 90 tablet 1  ?  Sig: TAKE 1 TABLET(5 MG) BY MOUTH DAILY  ?  ? Cardiovascular:  Antilipid - Statins 2 Failed - 08/27/2021  1:16 PM  ?  ?  Failed - Lipid Panel in normal range within the last 12 months  ?  Cholesterol, Total  ?Date Value Ref Range Status  ?08/25/2021 170 100 - 199 mg/dL Final  ? ?LDL Chol Calc (NIH)  ?Date Value Ref Range Status  ?08/25/2021 85 0 - 99 mg/dL Final  ? ?HDL  ?Date Value Ref Range Status  ?08/25/2021 65 >39 mg/dL Final  ? ?Triglycerides  ?Date Value Ref Range Status  ?08/25/2021 112 0 - 149 mg/dL Final  ? ?  ?  ?  Passed - Cr in normal range and within 360 days  ?  Creatinine, Ser  ?Date Value Ref Range Status  ?08/25/2021 0.75 0.57 - 1.00 mg/dL Final  ?   ?  ?  Passed - Patient is not pregnant  ?  ?  Passed - Valid encounter within last 12 months  ?  Recent Outpatient Visits   ?      ? 2 days ago Annual physical exam  ? [facility], FNP  ? 2 months ago Primary hypertension  ? [facility], FNP  ? 7 months ago Palpitations  ? [facility], FNP  ? 7 months ago Palpitations  ? [facility], FNP  ? 11 months ago Leukocytosis, unspecified type  ? [facility], [facility], FNP  ?  ?  ?Future Appointments   ?        ? In 3 months Eula Fried, FNP [facility]  ?  ? ?  ?  ?  ? ? ?

## 2021-09-30 ENCOUNTER — Telehealth: Payer: Self-pay

## 2021-09-30 ENCOUNTER — Telehealth: Payer: Self-pay | Admitting: Gastroenterology

## 2021-09-30 NOTE — Telephone Encounter (Signed)
PATIENT WANTED TO CANCEL PROCEDURE AND WILL CALL us BACK WHEN SHE IS READY TO RESCHEDULE CALLED ENDO ?

## 2021-09-30 NOTE — Telephone Encounter (Signed)
Pt is requesting that her colonoscpy is cancelled and she will call back to resch ?

## 2021-10-05 ENCOUNTER — Encounter: Admission: RE | Payer: Self-pay | Source: Home / Self Care

## 2021-10-05 ENCOUNTER — Ambulatory Visit: Admission: RE | Admit: 2021-10-05 | Payer: Medicare HMO | Source: Home / Self Care | Admitting: Gastroenterology

## 2021-10-05 SURGERY — COLONOSCOPY WITH PROPOFOL
Anesthesia: General

## 2021-10-06 ENCOUNTER — Telehealth: Payer: Self-pay | Admitting: *Deleted

## 2021-10-06 NOTE — Telephone Encounter (Signed)
ATC X 3 to schedule Yearly Lung CA CT Scan. Primary # has no VM set up, alternate # no answer or VM. ?

## 2021-10-12 ENCOUNTER — Encounter: Payer: Self-pay | Admitting: *Deleted

## 2021-10-14 ENCOUNTER — Ambulatory Visit
Admission: RE | Admit: 2021-10-14 | Discharge: 2021-10-14 | Disposition: A | Discharge status: Home / Self Care | Payer: Medicare HMO | Source: Ambulatory Visit | Attending: Family Medicine | Admitting: Family Medicine

## 2021-10-14 DIAGNOSIS — Z1231 Encounter for screening mammogram for malignant neoplasm of breast: Secondary | ICD-10-CM | POA: Diagnosis not present

## 2021-10-14 DIAGNOSIS — Z780 Asymptomatic menopausal state: Secondary | ICD-10-CM | POA: Diagnosis not present

## 2021-10-14 DIAGNOSIS — M810 Age-related osteoporosis without current pathological fracture: Secondary | ICD-10-CM | POA: Diagnosis not present

## 2021-10-14 DIAGNOSIS — Z87891 Personal history of nicotine dependence: Secondary | ICD-10-CM | POA: Diagnosis not present

## 2021-10-14 DIAGNOSIS — Z13820 Encounter for screening for osteoporosis: Secondary | ICD-10-CM | POA: Insufficient documentation

## 2021-10-14 DIAGNOSIS — R636 Underweight: Secondary | ICD-10-CM | POA: Insufficient documentation

## 2021-10-15 ENCOUNTER — Telehealth: Payer: Self-pay | Admitting: Family Medicine

## 2021-10-15 NOTE — Telephone Encounter (Signed)
Copied from CRM [id number]. Topic: Medicare AWV ?>> Oct 15, 2021 12:22 PM Claudette Laws R wrote: ?Reason for CRM:  ?Left message for patient to call back and schedule Medicare Annual Wellness Visit (AWV) in office.  ? ?If not able to come in the office, please offer to do virtually or by telephone.  ? ?No hx of AWV - AWV-I eligible per [facility] as of  04/28/2019 ? ?Please schedule at anytime with [facility] Health Advisor.  ? ?45 minute appointment ? ?Any questions, please contact me at [phone] ?

## 2021-10-20 ENCOUNTER — Other Ambulatory Visit: Payer: Self-pay | Admitting: *Deleted

## 2021-10-20 DIAGNOSIS — F17210 Nicotine dependence, cigarettes, uncomplicated: Secondary | ICD-10-CM

## 2021-10-20 DIAGNOSIS — Z87891 Personal history of nicotine dependence: Secondary | ICD-10-CM

## 2021-10-20 DIAGNOSIS — Z122 Encounter for screening for malignant neoplasm of respiratory organs: Secondary | ICD-10-CM

## 2021-11-05 ENCOUNTER — Ambulatory Visit
Admission: RE | Admit: 2021-11-05 | Discharge: 2021-11-05 | Disposition: A | Discharge status: Home / Self Care | Payer: Medicare HMO | Source: Ambulatory Visit | Attending: Acute Care | Admitting: Acute Care

## 2021-11-05 DIAGNOSIS — Z87891 Personal history of nicotine dependence: Secondary | ICD-10-CM | POA: Insufficient documentation

## 2021-11-05 DIAGNOSIS — F17210 Nicotine dependence, cigarettes, uncomplicated: Secondary | ICD-10-CM | POA: Insufficient documentation

## 2021-11-05 DIAGNOSIS — Z122 Encounter for screening for malignant neoplasm of respiratory organs: Secondary | ICD-10-CM | POA: Diagnosis not present

## 2021-11-05 DIAGNOSIS — R69 Illness, unspecified: Secondary | ICD-10-CM | POA: Diagnosis not present

## 2021-11-09 ENCOUNTER — Other Ambulatory Visit: Payer: Self-pay | Admitting: Acute Care

## 2021-11-09 DIAGNOSIS — Z122 Encounter for screening for malignant neoplasm of respiratory organs: Secondary | ICD-10-CM

## 2021-11-09 DIAGNOSIS — F17210 Nicotine dependence, cigarettes, uncomplicated: Secondary | ICD-10-CM

## 2021-11-09 DIAGNOSIS — Z87891 Personal history of nicotine dependence: Secondary | ICD-10-CM

## 2021-11-26 NOTE — Progress Notes (Signed)
Established patient visit  I,Joseline E Rosas,acting as a scribe for Gwyneth Sprout, FNP.,have documented all relevant documentation on the behalf of Gwyneth Sprout, FNP,as directed by  Gwyneth Sprout, FNP while in the presence of Gwyneth Sprout, FNP.   Patient: Sierra Brown   DOB: [birth year]   71 y.o. Female  MRN: [id number] Visit Date: 12/01/2021  Today's healthcare provider: Gwyneth Sprout, FNP  Re Introduced to nurse practitioner role and practice setting.  All questions answered.  Discussed provider/patient relationship and expectations.   Chief Complaint  Patient presents with   follow-up BP   Subjective    HPI  Hypertension, follow-up  BP Readings from Last 3 Encounters:  12/01/21 (!) 148/56  08/25/21 128/69  06/02/21 112/60   Wt Readings from Last 3 Encounters:  12/01/21 104 lb 8 oz (47.4 kg)  11/05/21 101 lb (45.8 kg)  08/25/21 105 lb 11.2 oz (47.9 kg)     She was last seen for hypertension 6 months ago.  BP at that visit was 112/60. Management since that visit includes. Not on BP medication.  She is following a Regular diet. She is exercising. She does smoke.   Outside blood pressures are not being checked at this time. Recommend patient to send log of Bps, if elevated >140/>90, encourage 3 month follow in place of 6 month follow up.  Symptoms: No chest pain No chest pressure  No palpitations No syncope  No dyspnea No orthopnea  No paroxysmal nocturnal dyspnea No lower extremity edema   Pertinent labs Lab Results  Component Value Date   CHOL 170 08/25/2021   HDL 65 08/25/2021   LDLCALC 85 08/25/2021   TRIG 112 08/25/2021   CHOLHDL 2.6 08/25/2021   Lab Results  Component Value Date   NA 142 08/25/2021   K 4.8 08/25/2021   CREATININE 0.75 08/25/2021   EGFR 87 08/25/2021   GLUCOSE 97 08/25/2021   TSH 1.250 01/20/2021     The 10-year ASCVD risk score (Arnett DK, et al., 2019) is:  15.1%  ---------------------------------------------------------------------------------------------------   Medications: Outpatient Medications Prior to Visit  Medication Sig   albuterol (VENTOLIN HFA) 108 (90 Base) MCG/ACT inhaler Inhale 2 puffs into the lungs every 6 (six) hours as needed for wheezing or shortness of breath.   rosuvastatin (CRESTOR) 5 MG tablet TAKE 1 TABLET(5 MG) BY MOUTH DAILY   Spacer/Aero-Holding Chambers (AEROCHAMBER MV) inhaler Use as instructed   No facility-administered medications prior to visit.    Review of Systems     Objective    BP (!) 148/56 (BP Location: Left Arm, Patient Position: Sitting, Cuff Size: Normal)   Pulse (!) 57   Temp 97.8 F (36.6 C) (Oral)   Resp 16   Ht 5' 5"  (1.651 m)   Wt 104 lb 8 oz (47.4 kg)   BMI 17.39 kg/m    Physical Exam Vitals and nursing note reviewed.  Constitutional:      General: She is not in acute distress.    Appearance: Normal appearance. She is underweight. She is not ill-appearing, toxic-appearing or diaphoretic.  HENT:     Head: Normocephalic and atraumatic.  Cardiovascular:     Rate and Rhythm: Regular rhythm. Bradycardia present.     Pulses: Normal pulses.     Heart sounds: Normal heart sounds. No murmur heard.   No friction rub. No gallop.  Pulmonary:     Effort: Pulmonary effort is normal. No respiratory distress.  Breath sounds: Normal breath sounds. No stridor. No wheezing, rhonchi or rales.  Chest:     Chest wall: No tenderness.  Abdominal:     General: Bowel sounds are normal.     Palpations: Abdomen is soft.  Musculoskeletal:        General: No swelling, tenderness, deformity or signs of injury. Normal range of motion.     Right lower leg: No edema.     Left lower leg: No edema.  Skin:    General: Skin is warm and dry.     Capillary Refill: Capillary refill takes less than 2 seconds.     Coloration: Skin is not jaundiced or pale.     Findings: No bruising, erythema, lesion or  rash.  Neurological:     General: No focal deficit present.     Mental Status: She is alert and oriented to person, place, and time. Mental status is at baseline.     Cranial Nerves: No cranial nerve deficit.     Sensory: No sensory deficit.     Motor: No weakness.     Coordination: Coordination normal.  Psychiatric:        Mood and Affect: Mood normal.        Behavior: Behavior normal.        Thought Content: Thought content normal.        Judgment: Judgment normal.     No results found for any visits on 12/01/21.  Assessment & Plan     Problem List Items Addressed This Visit       Cardiovascular and Mediastinum   Aortic atherosclerosis (HCC)    Chronic, stable Continues to be intermittent with statin use, due to side effects of kidney problems Discussed possible side effect of reported kidney damage and how we perform routine blood monitoring, CMP vs her finding of kidney stone, incidentally on lung CT Encouraged to take for best prevention of MI/CVA Advised based on EBP statins do not cause kidney stones, they can cause kidney damage- however, we check labs annually at minimum to prevent; reassurance provided       Primary hypertension - Primary    Stable, variable presentation; does not have recent home logs with her/has not been checking Previously not on medication, was not >140/>90 Denies CP Denies SOB/ DOE Denies low blood pressure/hypotension Denies vision changes No LE Edema noted on exam Return to clinic in 6 months Seek emergent care if you develop chest pain or chest pressure         Musculoskeletal and Integument   Age-related osteoporosis without current pathological fracture    Chronic, stable High risk for bone breaks due to Body mass index is 17.39 kg/m. Has started taking OTC supplements Is active Is working on smoking reduction; has not quit at this time. Congratulated on efforts.         Other   Colon cancer screening    Had colonoscopy  scheduled; had to cancel d/t family need Recommend colo guard at this time If positive, can reschedule colonoscopy         Relevant Orders   Cologuard   Mildly underweight adult    Body mass index is 17.39 kg/m. At risk for increased fractures with hx of OP and current BMI. Risk increased with continued use of tobacco products.  Continued to recommend 3 meals per day, and balanced snacks. Exercise/move daily for bone health.       Tobacco dependence due to cigarettes    Chronic,  stable Has reduced use Did not disclose #/cigs per day Previously 15/day Continue annual CT for lung cancer screening Not on daily inhaler at this point         Return in about 6 months (around 06/02/2022) for chonic disease management.      Vonna Kotyk, FNP, have reviewed all documentation for this visit. The documentation on 12/01/21 for the exam, diagnosis, procedures, and orders are all accurate and complete.    Gwyneth Sprout, [place] [phone] (phone) [phone] (fax)  [place]

## 2021-12-01 ENCOUNTER — Ambulatory Visit (INDEPENDENT_AMBULATORY_CARE_PROVIDER_SITE_OTHER): Payer: Medicare HMO | Admitting: Family Medicine

## 2021-12-01 ENCOUNTER — Encounter: Payer: Self-pay | Admitting: Family Medicine

## 2021-12-01 VITALS — BP 148/56 | HR 57 | Temp 97.8°F | Resp 16 | Ht 65.0 in | Wt 104.5 lb

## 2021-12-01 DIAGNOSIS — R636 Underweight: Secondary | ICD-10-CM | POA: Diagnosis not present

## 2021-12-01 DIAGNOSIS — M810 Age-related osteoporosis without current pathological fracture: Secondary | ICD-10-CM | POA: Diagnosis not present

## 2021-12-01 DIAGNOSIS — I10 Essential (primary) hypertension: Secondary | ICD-10-CM | POA: Diagnosis not present

## 2021-12-01 DIAGNOSIS — Z1211 Encounter for screening for malignant neoplasm of colon: Secondary | ICD-10-CM | POA: Diagnosis not present

## 2021-12-01 DIAGNOSIS — I700 Atherosclerosis of aorta: Secondary | ICD-10-CM

## 2021-12-01 DIAGNOSIS — F17210 Nicotine dependence, cigarettes, uncomplicated: Secondary | ICD-10-CM | POA: Diagnosis not present

## 2021-12-01 DIAGNOSIS — R69 Illness, unspecified: Secondary | ICD-10-CM | POA: Diagnosis not present

## 2021-12-01 NOTE — Assessment & Plan Note (Addendum)
Chronic, stable Continues to be intermittent with statin use, due to side effects of kidney problems Discussed possible side effect of reported kidney damage and how we perform routine blood monitoring, CMP vs her finding of kidney stone, incidentally on lung CT Encouraged to take for best prevention of MI/CVA Advised based on EBP statins do not cause kidney stones, they can cause kidney damage- however, we check labs annually at minimum to prevent; reassurance provided

## 2021-12-01 NOTE — Assessment & Plan Note (Signed)
Chronic, stable Has reduced use Did not disclose #/cigs per day Previously 15/day Continue annual CT for lung cancer screening Not on daily inhaler at this point

## 2021-12-01 NOTE — Assessment & Plan Note (Signed)
Stable, variable presentation; does not have recent home logs with her/has not been checking Previously not on medication, was not >140/>90 Denies CP Denies SOB/ DOE Denies low blood pressure/hypotension Denies vision changes No LE Edema noted on exam Return to clinic in 6 months Seek emergent care if you develop chest pain or chest pressure

## 2021-12-01 NOTE — Assessment & Plan Note (Signed)
Chronic, stable High risk for bone breaks due to Body mass index is 17.39 kg/m. Has started taking OTC supplements Is active Is working on smoking reduction; has not quit at this time. Congratulated on efforts.

## 2021-12-01 NOTE — Assessment & Plan Note (Signed)
Body mass index is 17.39 kg/m. At risk for increased fractures with hx of OP and current BMI. Risk increased with continued use of tobacco products.  Continued to recommend 3 meals per day, and balanced snacks. Exercise/move daily for bone health.

## 2021-12-01 NOTE — Assessment & Plan Note (Signed)
Had colonoscopy scheduled; had to cancel d/t family need Recommend colo guard at this time If positive, can reschedule colonoscopy

## 2021-12-10 ENCOUNTER — Telehealth: Payer: Self-pay | Admitting: Family Medicine

## 2021-12-10 NOTE — Telephone Encounter (Signed)
Copied from CRM [id number]. Topic: Medicare AWV >> Dec 10, 2021  2:34 PM Zannie Kehr wrote: Reason for CRM:  No answer unable to leave a message for patient to call back and schedule Medicare Annual Wellness Visit (AWV) in office.   If not able to come in the office, please offer to do virtually or by telephone.   No hx of AWV - AWV-I eligible per [facility] as of 04/28/2019  Please schedule at anytime with [facility] Health Advisor.   45 minute appointment  Any questions, please contact me at [phone]

## 2021-12-31 DIAGNOSIS — H52223 Regular astigmatism, bilateral: Secondary | ICD-10-CM | POA: Diagnosis not present

## 2021-12-31 DIAGNOSIS — H524 Presbyopia: Secondary | ICD-10-CM | POA: Diagnosis not present

## 2021-12-31 DIAGNOSIS — H2513 Age-related nuclear cataract, bilateral: Secondary | ICD-10-CM | POA: Diagnosis not present

## 2021-12-31 DIAGNOSIS — Z0100 Encounter for examination of eyes and vision without abnormal findings: Secondary | ICD-10-CM | POA: Diagnosis not present

## 2022-01-10 ENCOUNTER — Telehealth: Payer: Self-pay | Admitting: Family Medicine

## 2022-01-10 NOTE — Telephone Encounter (Signed)
Copied from CRM [id number]. Topic: Medicare AWV >> Jan 10, 2022  2:23 PM Zannie Kehr wrote: Reason for CRM:  Left message for patient to call back and schedule Medicare Annual Wellness Visit (AWV) in office.   If not able to come in the office, please offer to do virtually or by telephone.   No hx of AWV - AWV-I eligible per [employer] as of 04/28/2019   Please schedule at anytime with [facility] Health Advisor.   45 minute appointment  Any questions, please contact me at [phone]

## 2022-01-13 ENCOUNTER — Emergency Department: Payer: Medicare HMO

## 2022-01-13 DIAGNOSIS — R69 Illness, unspecified: Secondary | ICD-10-CM | POA: Diagnosis not present

## 2022-01-13 DIAGNOSIS — J439 Emphysema, unspecified: Secondary | ICD-10-CM | POA: Diagnosis not present

## 2022-01-13 DIAGNOSIS — R202 Paresthesia of skin: Secondary | ICD-10-CM | POA: Diagnosis not present

## 2022-01-13 DIAGNOSIS — J449 Chronic obstructive pulmonary disease, unspecified: Secondary | ICD-10-CM | POA: Insufficient documentation

## 2022-01-13 DIAGNOSIS — R0602 Shortness of breath: Secondary | ICD-10-CM | POA: Insufficient documentation

## 2022-01-13 DIAGNOSIS — F17200 Nicotine dependence, unspecified, uncomplicated: Secondary | ICD-10-CM | POA: Insufficient documentation

## 2022-01-13 DIAGNOSIS — R200 Anesthesia of skin: Secondary | ICD-10-CM | POA: Diagnosis not present

## 2022-01-13 DIAGNOSIS — R0789 Other chest pain: Secondary | ICD-10-CM | POA: Insufficient documentation

## 2022-01-13 DIAGNOSIS — I2510 Atherosclerotic heart disease of native coronary artery without angina pectoris: Secondary | ICD-10-CM | POA: Diagnosis not present

## 2022-01-13 DIAGNOSIS — R079 Chest pain, unspecified: Secondary | ICD-10-CM | POA: Diagnosis not present

## 2022-01-13 DIAGNOSIS — R29818 Other symptoms and signs involving the nervous system: Secondary | ICD-10-CM | POA: Diagnosis not present

## 2022-01-13 LAB — CBC
HCT: 40.5 % (ref 36.0–46.0)
Hemoglobin: 13.4 g/dL (ref 12.0–15.0)
MCH: 31.4 pg (ref 26.0–34.0)
MCHC: 33.1 g/dL (ref 30.0–36.0)
MCV: 94.8 fL (ref 80.0–100.0)
Platelets: 252 10*3/uL (ref 150–400)
RBC: 4.27 MIL/uL (ref 3.87–5.11)
RDW: 12.3 % (ref 11.5–15.5)
WBC: 7.6 10*3/uL (ref 4.0–10.5)
nRBC: 0 % (ref 0.0–0.2)

## 2022-01-13 LAB — BASIC METABOLIC PANEL
Anion gap: 4 — ABNORMAL LOW (ref 5–15)
BUN: 20 mg/dL (ref 8–23)
CO2: 26 mmol/L (ref 22–32)
Calcium: 9.2 mg/dL (ref 8.9–10.3)
Chloride: 110 mmol/L (ref 98–111)
Creatinine, Ser: 0.97 mg/dL (ref 0.44–1.00)
GFR, Estimated: >60 mL/min (ref >60)
Glucose, Bld: 90 mg/dL (ref 70–99)
Potassium: 4.3 mmol/L (ref 3.5–5.1)
Sodium: 140 mmol/L (ref 135–145)

## 2022-01-13 LAB — TROPONIN I (HIGH SENSITIVITY): Troponin I (High Sensitivity): 4 ng/L (ref <18)

## 2022-01-13 NOTE — ED Triage Notes (Signed)
Pt states that two days she began having intermittent numbness and tingling of the left arm, leg and side. States it feels like it is asleep. No focal weakness. Chest tightness, and shortness of breath

## 2022-01-14 ENCOUNTER — Emergency Department: Payer: Medicare HMO

## 2022-01-14 ENCOUNTER — Emergency Department
Admission: EM | Admit: 2022-01-14 | Discharge: 2022-01-14 | Disposition: A | Discharge status: Home / Self Care | Payer: Medicare HMO | Attending: Emergency Medicine | Admitting: Emergency Medicine

## 2022-01-14 DIAGNOSIS — R079 Chest pain, unspecified: Secondary | ICD-10-CM

## 2022-01-14 DIAGNOSIS — R200 Anesthesia of skin: Secondary | ICD-10-CM | POA: Diagnosis not present

## 2022-01-14 DIAGNOSIS — R202 Paresthesia of skin: Secondary | ICD-10-CM | POA: Diagnosis not present

## 2022-01-14 DIAGNOSIS — R29818 Other symptoms and signs involving the nervous system: Secondary | ICD-10-CM | POA: Diagnosis not present

## 2022-01-14 LAB — TROPONIN I (HIGH SENSITIVITY): Troponin I (High Sensitivity): 8 ng/L (ref <18)

## 2022-01-14 MED ORDER — GADOBUTROL 1 MMOL/ML IV SOLN
5.0000 mL | Freq: Once | INTRAVENOUS | Status: AC | PRN
  Administered 2022-01-14: 5 mL via INTRAVENOUS

## 2022-01-14 NOTE — ED Provider Notes (Signed)
[facility] Provider Note    Event Date/Time   First MD Initiated Contact with Patient 01/14/22 0002     (approximate)   History   Numbness, Chest Pain, and Shortness of Breath   HPI  Sierra Brown is a 71 y.o. female with history of COPD, hyperlipidemia who presents to the emergency department with complaints of 3 days of anterior chest tightness, shortness of breath that improves with using her albuterol inhaler and tingling in the left hand and foot.  Denies any numbness, weakness, headache, head injury, nausea, vomiting, fever, cough, dizziness, diaphoresis.  No history of CAD, CVA.  Had cardiac work-up including cardiac CT and echocardiogram in August 2022.  States the fast several days she has been lifting heavy things and doing strenuous activity without any worsening of her symptoms.   History provided by patient and husband.    Past Medical History:  Diagnosis Date   Allergy    allerlgic rhinitis   Cataract    COPD (chronic obstructive pulmonary disease) (HCC)    Pneumonia    Tobacco use disorder     Past Surgical History:  Procedure Laterality Date   CESAREAN SECTION     TONSILLECTOMY      MEDICATIONS:  Prior to Admission medications   Medication Sig Start Date End Date Taking? Authorizing Provider  albuterol (VENTOLIN HFA) 108 (90 Base) MCG/ACT inhaler Inhale 2 puffs into the lungs every 6 (six) hours as needed for wheezing or shortness of breath. 06/15/21   Jacky Kindle, FNP  rosuvastatin (CRESTOR) 5 MG tablet TAKE 1 TABLET(5 MG) BY MOUTH DAILY 08/27/21   Jacky Kindle, FNP  Spacer/Aero-Holding Chambers (AEROCHAMBER MV) inhaler Use as instructed 06/15/21   Jacky Kindle, FNP    Physical Exam   Triage Vital Signs: ED Triage Vitals  Enc Vitals Group     BP 01/13/22 2134 (!) 184/66     Pulse Rate 01/13/22 2134 69     Resp 01/13/22 2134 18     Temp 01/13/22 2134 97.7 F (36.5 C)     Temp Source 01/13/22 2134 Oral     SpO2  01/13/22 2134 98 %     Weight --      Height --      Head Circumference --      Peak Flow --      Pain Score 01/13/22 2135 4     Pain Loc --      Pain Edu? --      Excl. in GC? --     Most recent vital signs: Vitals:   01/14/22 0300 01/14/22 0315  BP: 136/72 132/66  Pulse: 61 64  Resp:    Temp:    SpO2: 94% 95%    CONSTITUTIONAL: Alert and oriented and responds appropriately to questions. Well-appearing; well-nourished HEAD: Normocephalic, atraumatic EYES: Conjunctivae clear, pupils appear equal, sclera nonicteric ENT: normal nose; moist mucous membranes NECK: Supple, normal ROM CARD: RRR; S1 and S2 appreciated; no murmurs, no clicks, no rubs, no gallops RESP: Normal chest excursion without splinting or tachypnea; breath sounds clear and equal bilaterally; no wheezes, no rhonchi, no rales, no hypoxia or respiratory distress, speaking full sentences ABD/GI: Normal bowel sounds; non-distended; soft, non-tender, no rebound, no guarding, no peritoneal signs BACK: The back appears normal EXT: Normal ROM in all joints; no deformity noted, no edema; no cyanosis, extremities warm and well-perfused SKIN: Normal color for age and race; warm; no rash on exposed skin NEURO:  Moves all extremities equally, normal speech, no drift, normal gait, cranial nerves II through XII intact, normal sensation diffusely PSYCH: The patient's mood and manner are appropriate.   ED Results / Procedures / Treatments   LABS: (all labs ordered are listed, but only abnormal results are displayed) Labs Reviewed  BASIC METABOLIC PANEL - Abnormal; Notable for the following components:      Result Value   Anion gap 4 (*)    All other components within normal limits  CBC  TROPONIN I (HIGH SENSITIVITY)  TROPONIN I (HIGH SENSITIVITY)     EKG:  EKG Interpretation  Date/Time:  Thursday January 13 2022 21:35:51 EDT Ventricular Rate:  68 PR Interval:  154 QRS Duration: 62 QT Interval:  382 QTC  Calculation: 406 R Axis:   88 Text Interpretation: Normal sinus rhythm Septal infarct , age undetermined Abnormal ECG When compared with ECG of 14-Feb-2021 09:54, PREVIOUS ECG IS PRESENT Confirmed by Rochele Raring [id number]) on 01/14/2022 12:04:39 AM         RADIOLOGY: My personal review and interpretation of imaging: MRI brain and cervical spine show no acute abnormality.  Chest x-ray shows emphysematous changes.  I have personally reviewed all radiology reports.   MR BRAIN W WO CONTRAST  Result Date: 01/14/2022 CLINICAL DATA:  Acute neurologic deficit EXAM: MRI HEAD WITHOUT AND WITH CONTRAST TECHNIQUE: Multiplanar, multiecho pulse sequences of the brain and surrounding structures were obtained without and with intravenous contrast. CONTRAST:  56mL GADAVIST GADOBUTROL 1 MMOL/ML IV SOLN COMPARISON:  None Available. FINDINGS: Brain: No acute infarct, mass effect or extra-axial collection. No acute or chronic hemorrhage. There is multifocal hyperintense T2-weighted signal within the white matter. Parenchymal volume and CSF spaces are normal. the midline structures are normal. There is no abnormal contrast enhancement. Vascular: Major flow voids are preserved. Skull and upper cervical spine: Normal calvarium and skull base. Visualized upper cervical spine and soft tissues are normal. Sinuses/Orbits:No paranasal sinus fluid levels or advanced mucosal thickening. No mastoid or middle ear effusion. Normal orbits. IMPRESSION: 1. No acute intracranial abnormality. 2. Multifocal hyperintense T2-weighted signal within the white matter, most consistent with chronic microvascular ischemia. Electronically Signed   By: Deatra Robinson M.D.   On: 01/14/2022 02:31   MR CERVICAL SPINE W WO CONTRAST  Result Date: 01/14/2022 CLINICAL DATA:  Intermittent numbness and tingling of the left arm and leg EXAM: MRI CERVICAL SPINE WITHOUT AND WITH CONTRAST TECHNIQUE: Multiplanar and multiecho pulse sequences of the cervical spine,  to include the craniocervical junction and cervicothoracic junction, were obtained without and with intravenous contrast. CONTRAST:  55mL GADAVIST GADOBUTROL 1 MMOL/ML IV SOLN COMPARISON:  None Available. FINDINGS: Alignment: Physiologic. Vertebrae: No fracture, evidence of discitis, or bone lesion. Cord: Normal signal and morphology. Posterior Fossa, vertebral arteries, paraspinal tissues: Negative. Disc levels: C1-2: Unremarkable. C2-3: Normal disc space and facet joints. There is no spinal canal stenosis. No neural foraminal stenosis. C3-4: Normal disc space and facet joints. There is no spinal canal stenosis. No neural foraminal stenosis. C4-5: Normal disc space and facet joints. There is no spinal canal stenosis. No neural foraminal stenosis. C5-6: Normal disc space and facet joints. There is no spinal canal stenosis. No neural foraminal stenosis. C6-7: Small disc bulge with endplate spurring. There is no spinal canal stenosis. Moderate right and mild left neural foraminal stenosis. C7-T1: Normal disc space and facet joints. There is no spinal canal stenosis. No neural foraminal stenosis. IMPRESSION: 1. No acute abnormality of the cervical spine. 2. Moderate  right and mild left neural foraminal stenosis at C6-7. Electronically Signed   By: Deatra Robinson M.D.   On: 01/14/2022 02:29   DG Chest 2 View  Result Date: 01/13/2022 CLINICAL DATA:  Chest pain. EXAM: CHEST - 2 VIEW COMPARISON:  CT lung cancer screening from 11/05/2021. FINDINGS: The heart size and mediastinal contours are unremarkable. The lungs are hyperinflated with the with advanced changes of emphysema. No pleural effusion or edema. No airspace opacities. Right apical nodule corresponds to pleuroparenchymal scarring identified on the most recent chest CT from 11/05/2021. The visualized osseous structures appear intact. IMPRESSION: No active cardiopulmonary disease. Emphysema (ICD10-J43.9). Electronically Signed   By: Signa Kell M.D.   On:  01/13/2022 22:01     PROCEDURES:  Critical Care performed: No      .1-3 Lead EKG Interpretation  Performed by: Jaishon Krisher, Layla Maw, DO Authorized by: Caleel Kiner, Layla Maw, DO     Interpretation: normal     ECG rate:  64   ECG rate assessment: normal     Rhythm: sinus rhythm     Ectopy: none     Conduction: normal       IMPRESSION / MDM / ASSESSMENT AND PLAN / ED COURSE  I reviewed the triage vital signs and the nursing notes.    Patient here with complaints of anterior chest tightness, shortness of breath and left arm and leg tingling mostly in the hand and foot.  The patient is on the cardiac monitor to evaluate for evidence of arrhythmia and/or significant heart rate changes.   DIFFERENTIAL DIAGNOSIS (includes but not limited to):   ACS, PE, dissection, CVA, TIA   Patient's presentation is most consistent with acute presentation with potential threat to life or bodily function.   PLAN: Obtain CBC, BMP, troponin x2, chest x-ray, MRI of the brain.  EKG is nonischemic.   MEDICATIONS GIVEN IN ED: Medications  gadobutrol (GADAVIST) 1 MMOL/ML injection 5 mL (5 mLs Intravenous Contrast Given 01/14/22 0215)     ED COURSE: Patient's labs show no leukocytosis, normal hemoglobin, normal electrolytes, renal function and 2 negative troponins.  Chest x-ray reviewed/interpreted by myself and radiologist and shows no acute abnormality.  She does have some emphysematous changes.  MRI of the brain reviewed/interpreted by myself and radiologist and shows no CVA.  MRI technician called me during patient's MRI to state that she thought she saw some abnormalities and recommended obtaining MR's with contrast to rule out demyelinating lesion.  Therefore MRI of the cervical spine was also obtained with and without contrast which has been reviewed/interpreted by myself and the radiologist and shows no acute abnormality.  On my exam, patient is neurologically intact with an NIH stroke scale of 0.  She  states she feels tingling in her hand and foot on the left side has normal sensation diffusely.  She has had a recent echocardiogram in August 2022 which was unremarkable.  She also had a coronary CT which did showed a calcium score that placed her in the 70th percentile for age and gender and had nonobstructive coronary artery disease in the LAD.  I do not see any history of carotid Dopplers.  She states that she is not taking her cholesterol medication and is still smoking.  We did discuss that she has multiple risk factors for ACS and CVA but given reassuring work-up it does not appear that she has had a heart attack or stroke today.  I have offered admission for further work-up for possible TIA  however patient would prefer discharge home with close outpatient follow-up with her PCP and cardiologist.  I feel given her symptoms seem somewhat atypical with chest pressure and shortness of breath and improved with breathing treatments and tingling in the left hand and foot that are not reproducible on exam and she has an NIH stroke scale here of 0 that it is reasonable for her to be discharged with close outpatient management.  We did however discuss at length return precautions.  Patient is comfortable with this plan.   CONSULTS: Admission offered but patient declines and would prefer outpatient management at this time.   OUTSIDE RECORDS REVIEWED:     Echo 01/2021: IMPRESSIONS     1. Left ventricular ejection fraction, by estimation, is 60 to 65%. The  left ventricle has normal function. The left ventricle has no regional  wall motion abnormalities. There is mild left ventricular hypertrophy.  Left ventricular diastolic parameters  were normal.   2. Right ventricular systolic function is normal. The right ventricular  size is normal. There is normal pulmonary artery systolic pressure. The  estimated right ventricular systolic pressure is 23.6 mmHg.   3. The mitral valve is normal in structure. No  evidence of mitral valve  regurgitation.   4. The aortic valve is tricuspid. Aortic valve regurgitation is trivial.  No aortic stenosis is present.   5. The inferior vena cava is normal in size with greater than 50%  respiratory variability, suggesting right atrial pressure of 3 mmHg.     Coronary artery CT 01/2021:  IMPRESSION: 1. Coronary artery calcium score 53.9 Agatston units. This places the patient in the 70th percentile for age and gender, suggesting intermediate risk for future cardiac events.   2.  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     FINAL CLINICAL IMPRESSION(S) / ED DIAGNOSES   Final diagnoses:  Chest pain, unspecified type  Left sided numbness     Rx / DC Orders   ED Discharge Orders     None        Note:  This document was prepared using Dragon voice recognition software and may include unintentional dictation errors.   Sheppard Luckenbach, Layla Maw, DO 01/14/22 [phone]

## 2022-01-14 NOTE — ED Notes (Addendum)
Patient's  O2 saturation showed 80%, which was incorrect. The probe was not on her finger properly.

## 2022-01-25 ENCOUNTER — Ambulatory Visit (INDEPENDENT_AMBULATORY_CARE_PROVIDER_SITE_OTHER): Payer: Medicare HMO | Admitting: Family Medicine

## 2022-01-25 ENCOUNTER — Encounter: Payer: Self-pay | Admitting: Family Medicine

## 2022-01-25 VITALS — BP 135/65 | HR 62 | Resp 18 | Wt 102.3 lb

## 2022-01-25 DIAGNOSIS — I700 Atherosclerosis of aorta: Secondary | ICD-10-CM

## 2022-01-25 DIAGNOSIS — Z8673 Personal history of transient ischemic attack (TIA), and cerebral infarction without residual deficits: Secondary | ICD-10-CM | POA: Diagnosis not present

## 2022-01-25 DIAGNOSIS — Z720 Tobacco use: Secondary | ICD-10-CM

## 2022-01-25 DIAGNOSIS — I10 Essential (primary) hypertension: Secondary | ICD-10-CM

## 2022-01-25 MED ORDER — ASPIRIN 81 MG PO TBEC: 81.0000 mg | DELAYED_RELEASE_TABLET | Freq: Every day | ORAL | 3 refills | Status: AC

## 2022-01-25 MED ORDER — ROSUVASTATIN CALCIUM 5 MG PO TABS: ORAL_TABLET | ORAL | 3 refills | Status: DC

## 2022-01-25 MED ORDER — ROSUVASTATIN CALCIUM 40 MG PO TABS: 40.0000 mg | ORAL_TABLET | Freq: Every day | ORAL | 3 refills | Status: DC

## 2022-01-25 NOTE — Assessment & Plan Note (Signed)
Chronic, improved from review of home log 2 blood pressure readings in last 3 weeks >135 SBP Continue to recommend goal of <140/<90 Pt reports excess traffic/stress in getting to appt today- believes that is what elevated her BP

## 2022-01-25 NOTE — Assessment & Plan Note (Signed)
Symptoms resolved; continue to monitor Vit D in diet Work on cholesterol reduction with Crestor 40 mg Add ASA 81 mg to assist

## 2022-01-25 NOTE — Assessment & Plan Note (Signed)
Chronic, stable Encourage regular use of Crestor Increase to 40 mg daily Continue to recommend smoking cessation Coronary CT reveals 53.9 units which puts patient at 70% for age/gender Non-obstructive CAD seen in LAD- add 81 mg ASA

## 2022-01-25 NOTE — Progress Notes (Signed)
Established patient visit  I,April Miller,acting as a scribe for Jacky Kindle, FNP.,have documented all relevant documentation on the behalf of Jacky Kindle, FNP,as directed by  Jacky Kindle, FNP while in the presence of Jacky Kindle, FNP.   Patient: Sierra Brown   DOB: 12/02/1950   71 y.o. Female  MRN: [id number] Visit Date: 01/25/2022  Today's healthcare provider: Jacky Kindle, FNP  Introduced to nurse practitioner role and practice setting.  All questions answered.  Discussed provider/patient relationship and expectations.   Chief Complaint  Patient presents with   Hospitalization Follow-up   Subjective    HPI  Follow up ER visit  Patient was seen in ER for Numbness, Chest Pain, and Shortness of Breath on 01/14/2022. She was treated for  Chest pain, unspecified type  Left sided numbness  . Treatment for this included; see notes in chart. She reports good compliance with treatment. She reports this condition is Improved.  -----------------------------------------------------------------------------------------  Patient feels much improved since ED visit. Patient has not had anymore episodes since ED visit.  Medications: Outpatient Medications Prior to Visit  Medication Sig   albuterol (VENTOLIN HFA) 108 (90 Base) MCG/ACT inhaler Inhale 2 puffs into the lungs every 6 (six) hours as needed for wheezing or shortness of breath.   Spacer/Aero-Holding Chambers (AEROCHAMBER MV) inhaler Use as instructed   [DISCONTINUED] rosuvastatin (CRESTOR) 5 MG tablet TAKE 1 TABLET(5 MG) BY MOUTH DAILY   No facility-administered medications prior to visit.    Review of Systems  Constitutional:  Negative for appetite change, chills, fatigue and fever.  Respiratory:  Negative for chest tightness and shortness of breath.   Cardiovascular:  Negative for chest pain and palpitations.  Gastrointestinal:  Negative for abdominal pain, nausea and vomiting.  Neurological:  Negative  for dizziness and weakness.    Last CBC Lab Results  Component Value Date   WBC 7.6 01/13/2022   HGB 13.4 01/13/2022   HCT 40.5 01/13/2022   MCV 94.8 01/13/2022   MCH 31.4 01/13/2022   RDW 12.3 01/13/2022   PLT 252 01/13/2022   Last metabolic panel Lab Results  Component Value Date   GLUCOSE 90 01/13/2022   NA 140 01/13/2022   K 4.3 01/13/2022   CL 110 01/13/2022   CO2 26 01/13/2022   BUN 20 01/13/2022   CREATININE 0.97 01/13/2022   GFRNONAA >60 01/13/2022   CALCIUM 9.2 01/13/2022   PROT 6.3 08/25/2021   ALBUMIN 4.5 08/25/2021   LABGLOB 1.8 08/25/2021   AGRATIO 2.5 (H) 08/25/2021   BILITOT 0.5 08/25/2021   ALKPHOS 93 08/25/2021   AST 16 08/25/2021   ALT 11 08/25/2021   ANIONGAP 4 (L) 01/13/2022   Last lipids Lab Results  Component Value Date   CHOL 170 08/25/2021   HDL 65 08/25/2021   LDLCALC 85 08/25/2021   TRIG 112 08/25/2021   CHOLHDL 2.6 08/25/2021   Last hemoglobin A1c No results found for: "HGBA1C" Last thyroid functions Lab Results  Component Value Date   TSH 1.250 01/20/2021   Last vitamin D Lab Results  Component Value Date   VD25OH 114.0 (H) 01/20/2021   Last vitamin B12 and Folate Lab Results  Component Value Date   VITAMINB12 337 08/05/2020   FOLATE >20.0 08/05/2020       Objective    BP 135/65 Comment: home reading  Pulse 62   Resp 18   Wt 102 lb 4.8 oz (46.4 kg)   SpO2 99%  BMI 17.02 kg/m   BP Readings from Last 3 Encounters:  01/25/22 135/65  01/14/22 132/66  12/01/21 (!) 148/56   Wt Readings from Last 3 Encounters:  01/25/22 102 lb 4.8 oz (46.4 kg)  12/01/21 104 lb 8 oz (47.4 kg)  11/05/21 101 lb (45.8 kg)   SpO2 Readings from Last 3 Encounters:  01/25/22 99%  01/14/22 95%  08/25/21 99%      Physical Exam Vitals and nursing note reviewed.  Constitutional:      General: She is not in acute distress.    Appearance: Normal appearance. She is underweight. She is not ill-appearing, toxic-appearing or  diaphoretic.  HENT:     Head: Normocephalic and atraumatic.  Cardiovascular:     Rate and Rhythm: Normal rate and regular rhythm.     Pulses: Normal pulses.     Heart sounds: Normal heart sounds. No murmur heard.    No friction rub. No gallop.  Pulmonary:     Effort: Pulmonary effort is normal. No respiratory distress.     Breath sounds: Normal breath sounds. No stridor. No wheezing, rhonchi or rales.  Chest:     Chest wall: No tenderness.  Musculoskeletal:        General: No swelling, tenderness, deformity or signs of injury. Normal range of motion.     Right lower leg: No edema.     Left lower leg: No edema.  Skin:    General: Skin is warm and dry.     Capillary Refill: Capillary refill takes less than 2 seconds.     Coloration: Skin is not jaundiced or pale.     Findings: No bruising, erythema, lesion or rash.     Comments: Denies numbness/tingling in L hand or L foot/toes  Neurological:     General: No focal deficit present.     Mental Status: She is alert and oriented to person, place, and time. Mental status is at baseline.     Cranial Nerves: No cranial nerve deficit.     Sensory: No sensory deficit.     Motor: No weakness.     Coordination: Coordination normal.  Psychiatric:        Mood and Affect: Mood normal.        Behavior: Behavior normal.        Thought Content: Thought content normal.        Judgment: Judgment normal.      No results found for any visits on 01/25/22.  Assessment & Plan     Problem List Items Addressed This Visit       Cardiovascular and Mediastinum   Aortic atherosclerosis (HCC) - Primary    Chronic, stable Encourage regular use of Crestor Increase to 40 mg daily Continue to recommend smoking cessation Coronary CT reveals 53.9 units which puts patient at 70% for age/gender Non-obstructive CAD seen in LAD- add 81 mg ASA      Relevant Medications   aspirin EC 81 MG tablet   rosuvastatin (CRESTOR) 40 MG tablet   Primary  hypertension    Chronic, improved from review of home log 2 blood pressure readings in last 3 weeks >135 SBP Continue to recommend goal of <140/<90 Pt reports excess traffic/stress in getting to appt today- believes that is what elevated her BP      Relevant Medications   aspirin EC 81 MG tablet   rosuvastatin (CRESTOR) 40 MG tablet     Other   History of TIA (transient ischemic attack)  Symptoms resolved; continue to monitor Vit D in diet Work on cholesterol reduction with Crestor 40 mg Add ASA 81 mg to assist       Relevant Medications   rosuvastatin (CRESTOR) 40 MG tablet   Tobacco abuse    Chronic, increased use from previous visit Reports 6/day Encourage reduction with goal of cessation      Relevant Medications   rosuvastatin (CRESTOR) 40 MG tablet     Return in about 4 months (around 05/27/2022) for annual examination.      Leilani Merl, FNP, have reviewed all documentation for this visit. The documentation on 01/25/22 for the exam, diagnosis, procedures, and orders are all accurate and complete.    Jacky Kindle, FNP  [facility] [phone] (phone) [phone] (fax)  [facility]

## 2022-01-25 NOTE — Assessment & Plan Note (Signed)
Chronic, increased use from previous visit Reports 6/day Encourage reduction with goal of cessation

## 2022-02-17 ENCOUNTER — Ambulatory Visit (INDEPENDENT_AMBULATORY_CARE_PROVIDER_SITE_OTHER): Payer: Medicare HMO

## 2022-02-17 VITALS — Ht 65.0 in | Wt 102.0 lb

## 2022-02-17 DIAGNOSIS — Z0000 Encounter for general adult medical examination without abnormal findings: Secondary | ICD-10-CM

## 2022-02-17 NOTE — Patient Instructions (Signed)
Ms. Hamblen , Thank you for taking time to come for your Medicare Wellness Visit. I appreciate your ongoing commitment to your health goals. Please review the following plan we discussed and let me know if I can assist you in the future.   Screening recommendations/referrals: Colonoscopy: has Cologuard, encouraged to do it Mammogram: 10/14/21 Bone Density: 10/14/21 Recommended yearly ophthalmology/optometry visit for glaucoma screening and checkup Recommended yearly dental visit for hygiene and checkup  Vaccinations: Influenza vaccine: 04/27/21 Pneumococcal vaccine: 08/25/21 Tdap vaccine: 06/27/97, due if have injury Shingles vaccine: n/d   Covid-19:n/d  Advanced directives: no  Conditions/risks identified: none  Next appointment: Follow up in one year for your annual wellness visit - 02/20/23 @ 2:45 pm by phone   Preventive Care 65 Years and Older, Female Preventive care refers to lifestyle choices and visits with your health care provider that can promote health and wellness. What does preventive care include? A yearly physical exam. This is also called an annual well check. Dental exams once or twice a year. Routine eye exams. Ask your health care provider how often you should have your eyes checked. Personal lifestyle choices, including: Daily care of your teeth and gums. Regular physical activity. Eating a healthy diet. Avoiding tobacco and drug use. Limiting alcohol use. Practicing safe sex. Taking low-dose aspirin every day. Taking vitamin and mineral supplements as recommended by your health care provider. What happens during an annual well check? The services and screenings done by your health care provider during your annual well check will depend on your age, overall health, lifestyle risk factors, and family history of disease. Counseling  Your health care provider may ask you questions about your: Alcohol use. Tobacco use. Drug use. Emotional well-being. Home and  relationship well-being. Sexual activity. Eating habits. History of falls. Memory and ability to understand (cognition). Work and work Astronomer. Reproductive health. Screening  You may have the following tests or measurements: Height, weight, and BMI. Blood pressure. Lipid and cholesterol levels. These may be checked every 5 years, or more frequently if you are over 54 years old. Skin check. Lung cancer screening. You may have this screening every year starting at age 62 if you have a 30-pack-year history of smoking and currently smoke or have quit within the past 15 years. Fecal occult blood test (FOBT) of the stool. You may have this test every year starting at age 68. Flexible sigmoidoscopy or colonoscopy. You may have a sigmoidoscopy every 5 years or a colonoscopy every 10 years starting at age 59. Hepatitis C blood test. Hepatitis B blood test. Sexually transmitted disease (STD) testing. Diabetes screening. This is done by checking your blood sugar (glucose) after you have not eaten for a while (fasting). You may have this done every 1-3 years. Bone density scan. This is done to screen for osteoporosis. You may have this done starting at age 78. Mammogram. This may be done every 1-2 years. Talk to your health care provider about how often you should have regular mammograms. Talk with your health care provider about your test results, treatment options, and if necessary, the need for more tests. Vaccines  Your health care provider may recommend certain vaccines, such as: Influenza vaccine. This is recommended every year. Tetanus, diphtheria, and acellular pertussis (Tdap, Td) vaccine. You may need a Td booster every 10 years. Zoster vaccine. You may need this after age 53. Pneumococcal 13-valent conjugate (PCV13) vaccine. One dose is recommended after age 45. Pneumococcal polysaccharide (PPSV23) vaccine. One dose is recommended  after age 39. Talk to your health care provider  about which screenings and vaccines you need and how often you need them. This information is not intended to replace advice given to you by your health care provider. Make sure you discuss any questions you have with your health care provider. Document Released: 07/10/2015 Document Revised: 03/02/2016 Document Reviewed: 04/14/2015 Elsevier Interactive Patient Education  2017 [employer] [employer] Prevention in the Home Falls can cause injuries. They can happen to people of all ages. There are many things you can do to make your home safe and to help prevent falls. What can I do on the outside of my home? Regularly fix the edges of walkways and driveways and fix any cracks. Remove anything that might make you trip as you walk through a door, such as a raised step or threshold. Trim any bushes or trees on the path to your home. Use bright outdoor lighting. Clear any walking paths of anything that might make someone trip, such as rocks or tools. Regularly check to see if handrails are loose or broken. Make sure that both sides of any steps have handrails. Any raised decks and porches should have guardrails on the edges. Have any leaves, snow, or ice cleared regularly. Use sand or salt on walking paths during winter. Clean up any spills in your garage right away. This includes oil or grease spills. What can I do in the bathroom? Use night lights. Install grab bars by the toilet and in the tub and shower. Do not use towel bars as grab bars. Use non-skid mats or decals in the tub or shower. If you need to sit down in the shower, use a plastic, non-slip stool. Keep the floor dry. Clean up any water that spills on the floor as soon as it happens. Remove soap buildup in the tub or shower regularly. Attach bath mats securely with double-sided non-slip rug tape. Do not have throw rugs and other things on the floor that can make you trip. What can I do in the bedroom? Use night lights. Make sure  that you have a light by your bed that is easy to reach. Do not use any sheets or blankets that are too big for your bed. They should not hang down onto the floor. Have a firm chair that has side arms. You can use this for support while you get dressed. Do not have throw rugs and other things on the floor that can make you trip. What can I do in the kitchen? Clean up any spills right away. Avoid walking on wet floors. Keep items that you use a lot in easy-to-reach places. If you need to reach something above you, use a strong step stool that has a grab bar. Keep electrical cords out of the way. Do not use floor polish or wax that makes floors slippery. If you must use wax, use non-skid floor wax. Do not have throw rugs and other things on the floor that can make you trip. What can I do with my stairs? Do not leave any items on the stairs. Make sure that there are handrails on both sides of the stairs and use them. Fix handrails that are broken or loose. Make sure that handrails are as long as the stairways. Check any carpeting to make sure that it is firmly attached to the stairs. Fix any carpet that is loose or worn. Avoid having throw rugs at the top or bottom of the stairs. If you  do have throw rugs, attach them to the floor with carpet tape. Make sure that you have a light switch at the top of the stairs and the bottom of the stairs. If you do not have them, ask someone to add them for you. What else can I do to help prevent falls? Wear shoes that: Do not have high heels. Have rubber bottoms. Are comfortable and fit you well. Are closed at the toe. Do not wear sandals. If you use a stepladder: Make sure that it is fully opened. Do not climb a closed stepladder. Make sure that both sides of the stepladder are locked into place. Ask someone to hold it for you, if possible. Clearly mark and make sure that you can see: Any grab bars or handrails. First and last steps. Where the edge of  each step is. Use tools that help you move around (mobility aids) if they are needed. These include: Canes. Walkers. Scooters. Crutches. Turn on the lights when you go into a dark area. Replace any light bulbs as soon as they burn out. Set up your furniture so you have a clear path. Avoid moving your furniture around. If any of your floors are uneven, fix them. If there are any pets around you, be aware of where they are. Review your medicines with your doctor. Some medicines can make you feel dizzy. This can increase your chance of falling. Ask your doctor what other things that you can do to help prevent falls. This information is not intended to replace advice given to you by your health care provider. Make sure you discuss any questions you have with your health care provider. Document Released: 04/09/2009 Document Revised: 11/19/2015 Document Reviewed: 07/18/2014 Elsevier Interactive Patient Education  2017 [employer].

## 2022-02-17 NOTE — Progress Notes (Signed)
Virtual Visit via Telephone Note  I connected with  Sierra Brown on 02/17/22 at  8:15 AM EDT by telephone and verified that I am speaking with the correct person using two identifiers.  Location: Patient: home Provider: [facility] Persons participating in the virtual visit: [place]   I discussed the limitations, risks, security and privacy concerns of performing an evaluation and management service by telephone and the availability of in person appointments. The patient expressed understanding and agreed to proceed.  Interactive audio and video telecommunications were attempted between this nurse and patient, however failed, due to patient having technical difficulties OR patient did not have access to video capability.  We continued and completed visit with audio only.  Some vital signs may be absent or patient reported.   Sierra David, LPN  Subjective:   Sierra Brown is a 71 y.o. female who presents for an Initial Medicare Annual Wellness Visit.  Review of Systems     Cardiac Risk Factors include: advanced age (>23mn, >>57women)     Objective:    There were no vitals filed for this visit. There is no height or weight on file to calculate BMI.     02/17/2022    8:16 AM 02/14/2021    5:00 PM 02/01/2021    6:37 PM  Advanced Directives  Does Patient Have a Medical Advance Directive? No No No  Would patient like information on creating a medical advance directive? No - Patient declined No - Patient declined No - Patient declined    Current Medications (verified) Outpatient Encounter Medications as of 02/17/2022  Medication Sig   albuterol (VENTOLIN HFA) 108 (90 Base) MCG/ACT inhaler Inhale 2 puffs into the lungs every 6 (six) hours as needed for wheezing or shortness of breath.   aspirin EC 81 MG tablet Take 1 tablet (81 mg total) by mouth daily. Swallow whole.   rosuvastatin (CRESTOR) 40 MG tablet Take 1 tablet (40 mg total) by mouth daily.    Spacer/Aero-Holding Chambers (AEROCHAMBER MV) inhaler Use as instructed   No facility-administered encounter medications on file as of 02/17/2022.    Allergies (verified) Atorvastatin, Codeine, Levofloxacin, Sulfonamide derivatives, Penicillins, and Sulfa antibiotics   History: Past Medical History:  Diagnosis Date   Allergy    allerlgic rhinitis   Cataract    COPD (chronic obstructive pulmonary disease) (HCC)    Pneumonia    Tobacco use disorder    Past Surgical History:  Procedure Laterality Date   CESAREAN SECTION     TONSILLECTOMY     Family History  Problem Relation Age of Onset   Ulcerative colitis Mother        colostomy   Hypertension Mother    Heart Problems Mother    Breast cancer Neg Hx    Social History   Socioeconomic History   Marital status: Married    Spouse name: Not on file   Number of children: Not on file   Years of education: Not on file   Highest education level: Not on file  Occupational History   Not on file  Tobacco Use   Smoking status: Every Day    Packs/day: 0.75    Years: 49.00    Total pack years: 36.75    Types: Cigarettes   Smokeless tobacco: Never  Vaping Use   Vaping Use: Never used  Substance and Sexual Activity   Alcohol use: Never   Drug use: Never   Sexual activity: Not on file  Other  Topics Concern   Not on file  Social History Narrative   Not on file   Social Determinants of Health   Financial Resource Strain: Low Risk  (02/17/2022)   Overall Financial Resource Strain (CARDIA)    Difficulty of Paying Living Expenses: Not hard at all  Food Insecurity: No Food Insecurity (02/17/2022)   Hunger Vital Sign    Worried About Running Out of Food in the Last Year: Never true    Ran Out of Food in the Last Year: Never true  Transportation Needs: No Transportation Needs (02/17/2022)   PRAPARE - Hydrologist (Medical): No    Lack of Transportation (Non-Medical): No  Physical Activity:  Sufficiently Active (02/17/2022)   Exercise Vital Sign    Days of Exercise per Week: 5 days    Minutes of Exercise per Session: 60 min  Stress: No Stress Concern Present (02/17/2022)   [place]    Feeling of Stress : Not at all  Social Connections: Moderately Integrated (02/17/2022)   Social Connection and Isolation Panel [NHANES]    Frequency of Communication with Friends and Family: More than three times a week    Frequency of Social Gatherings with Friends and Family: More than three times a week    Attends Religious Services: More than 4 times per year    Active Member of [employer] or Organizations: No    Attends Music therapist: Never    Marital Status: Married    Tobacco Counseling Ready to quit: Not Answered Counseling given: Not Answered   Clinical Intake:  Pre-visit preparation completed: Yes  Pain : No/denies pain     Nutritional Risks: None Diabetes: No  How often do you need to have someone help you when you read instructions, pamphlets, or other written materials from your doctor or pharmacy?: 1 - Never  Diabetic?no  Interpreter Needed?: No  Information entered by :: Kirke Shaggy LPN   Activities of Daily Living    02/17/2022    8:17 AM  In your present state of health, do you have any difficulty performing the following activities:  Hearing? 0  Vision? 0  Difficulty concentrating or making decisions? 0  Walking or climbing stairs? 0  Dressing or bathing? 0  Doing errands, shopping? 0  Preparing Food and eating ? N  Using the Toilet? N  In the past six months, have you accidently leaked urine? N  Do you have problems with loss of bowel control? N  Managing your Medications? N  Managing your Finances? N  Housekeeping or managing your Housekeeping? N    Patient Care Team: Gwyneth Sprout, FNP as PCP - General (Family Medicine) Debara Pickett Nadean Corwin, MD as PCP - Cardiology  (Cardiology)  Indicate any recent Medical Services you may have received from other than [facility] providers in the past year (date may be approximate).     Assessment:   This is a routine wellness examination for Sierra Brown.  Hearing/Vision screen Hearing Screening - Comments:: No aids Vision Screening - Comments:: Wears glasses- [facility]  Dietary issues and exercise activities discussed: Current Exercise Habits: Home exercise routine, Type of exercise: walking, Time (Minutes): 60, Frequency (Times/Week): 5, Weekly Exercise (Minutes/Week): 300, Intensity: Mild   Goals Addressed             This Visit's Progress    DIET - EAT MORE FRUITS AND VEGETABLES  Depression Screen    02/17/2022    8:14 AM 12/01/2021    8:10 AM 08/25/2021    9:18 AM 08/05/2020   10:18 AM  PHQ 2/9 Scores  PHQ - 2 Score 0 0 0 0  PHQ- 9 Score 0 0 1 7    Fall Risk    02/17/2022    8:16 AM 08/25/2021    9:19 AM 08/05/2020   10:15 AM  Fall Risk   Falls in the past year? 0 0 0  Number falls in past yr: 0 0 0  Injury with Fall? 0 0 0  Risk for fall due to : No Fall Risks    Follow up Falls evaluation completed      FALL RISK PREVENTION PERTAINING TO THE HOME:  Any stairs in or around the home? No  If so, are there any without handrails? No  Home free of loose throw rugs in walkways, pet beds, electrical cords, etc? Yes  Adequate lighting in your home to reduce risk of falls? Yes   ASSISTIVE DEVICES UTILIZED TO PREVENT FALLS:  Life alert? No  Use of a cane, walker or w/c? No  Grab bars in the bathroom? No  Shower chair or bench in shower? No  Elevated toilet seat or a handicapped toilet? No   Cognitive Function:        02/17/2022    8:18 AM  6CIT Screen  What Year? 0 points  What month? 0 points  What time? 0 points  Count back from 20 0 points  Months in reverse 0 points  Repeat phrase 0 points  Total Score 0 points    Immunizations Immunization History  Administered  Date(s) Administered   Influenza Whole 03/28/2007, 04/08/2010   Influenza-Unspecified 03/27/2013, 04/27/2021   PNEUMOCOCCAL CONJUGATE-20 08/25/2021   Pneumococcal Polysaccharide-23 08/01/2007   Td 06/27/1997    TDAP status: Due, Education has been provided regarding the importance of this vaccine. Advised may receive this vaccine at local pharmacy or Health Dept. Aware to provide a copy of the vaccination record if obtained from local pharmacy or Health Dept. Verbalized acceptance and understanding.  Flu Vaccine status: Up to date  Pneumococcal vaccine status: Up to date  Covid-19 vaccine status: Declined, Education has been provided regarding the importance of this vaccine but patient still declined. Advised may receive this vaccine at local pharmacy or Health Dept.or vaccine clinic. Aware to provide a copy of the vaccination record if obtained from local pharmacy or Health Dept. Verbalized acceptance and understanding.  Qualifies for Shingles Vaccine? Yes   Zostavax completed No   Shingrix Completed?: No.    Education has been provided regarding the importance of this vaccine. Patient has been advised to call insurance company to determine out of pocket expense if they have not yet received this vaccine. Advised may also receive vaccine at local pharmacy or Health Dept. Verbalized acceptance and understanding.  Screening Tests Health Maintenance  Topic Date Due   COVID-19 Vaccine (1) Never done   COLONOSCOPY (Pts 45-92yr Insurance coverage will need to be confirmed)  Never done   Zoster Vaccines- Shingrix (1 of 2) Never done   TETANUS/TDAP  06/28/2007   INFLUENZA VACCINE  01/25/2022   MAMMOGRAM  10/15/2022   Pneumonia Vaccine 71 Years old  Completed   DEXA SCAN  Completed   HPV VACCINES  Aged Out   Hepatitis C Screening  Discontinued    Health Maintenance  Health Maintenance Due  Topic Date Due   COVID-19  Vaccine (1) Never done   COLONOSCOPY (Pts 45-70yr Insurance  coverage will need to be confirmed)  Never done   Zoster Vaccines- Shingrix (1 of 2) Never done   TETANUS/TDAP  06/28/2007   INFLUENZA VACCINE  01/25/2022    Colorectal cancer screening: Type of screening: Cologuard. Completed has kit at home, encouraged to do it. Repeat every 3 years  Mammogram status: Completed 10/14/21. Repeat every year  Bone Density status: Completed 10/14/21. Results reflect: Bone density results: OSTEOPOROSIS. Repeat every 2 years.  Lung Cancer Screening: (Low Dose CT Chest recommended if Age 69-80years, 30 pack-year currently smoking OR have quit w/in 15years.) does qualify.   Lung Cancer Screening Referral: done a few months ago  Additional Screening:  Hepatitis C Screening: does qualify; Completed no  Vision Screening: Recommended annual ophthalmology exams for early detection of glaucoma and other disorders of the eye. Is the patient up to date with their annual eye exam?  Yes  Who is the provider or what is the name of the office in which the patient attends annual eye exams? [facility] If pt is not established with a provider, would they like to be referred to a provider to establish care? No .   Dental Screening: Recommended annual dental exams for proper oral hygiene  Community Resource Referral / Chronic Care Management: CRR required this visit?  No   CCM required this visit?  No      Plan:     I have personally reviewed and noted the following in the patient's chart:   Medical and social history Use of alcohol, tobacco or illicit drugs  Current medications and supplements including opioid prescriptions. Patient is not currently taking opioid prescriptions. Functional ability and status Nutritional status Physical activity Advanced directives List of other physicians Hospitalizations, surgeries, and ER visits in previous 12 months Vitals Screenings to include cognitive, depression, and falls Referrals and appointments  In addition, I  have reviewed and discussed with patient certain preventive protocols, quality metrics, and best practice recommendations. A written personalized care plan for preventive services as well as general preventive health recommendations were provided to patient.     LDionisio David LPN   [id number]  Nurse Notes: none

## 2022-02-18 ENCOUNTER — Ambulatory Visit
Admission: EM | Admit: 2022-02-18 | Discharge: 2022-02-18 | Disposition: A | Discharge status: Home / Self Care | Payer: Medicare HMO | Attending: Urgent Care | Admitting: Urgent Care

## 2022-02-18 DIAGNOSIS — R051 Acute cough: Secondary | ICD-10-CM

## 2022-02-18 DIAGNOSIS — J449 Chronic obstructive pulmonary disease, unspecified: Secondary | ICD-10-CM

## 2022-02-18 DIAGNOSIS — F17200 Nicotine dependence, unspecified, uncomplicated: Secondary | ICD-10-CM | POA: Diagnosis not present

## 2022-02-18 DIAGNOSIS — B349 Viral infection, unspecified: Secondary | ICD-10-CM

## 2022-02-18 DIAGNOSIS — R69 Illness, unspecified: Secondary | ICD-10-CM | POA: Diagnosis not present

## 2022-02-18 DIAGNOSIS — Z20822 Contact with and (suspected) exposure to covid-19: Secondary | ICD-10-CM | POA: Diagnosis not present

## 2022-02-18 DIAGNOSIS — F17210 Nicotine dependence, cigarettes, uncomplicated: Secondary | ICD-10-CM | POA: Diagnosis not present

## 2022-02-18 LAB — SARS CORONAVIRUS 2 BY RT PCR: SARS Coronavirus 2 by RT PCR: NEGATIVE

## 2022-02-18 MED ORDER — PREDNISONE 20 MG PO TABS
ORAL_TABLET | ORAL | 0 refills | Status: DC
Start: 2022-02-18 — End: 2022-04-02

## 2022-02-18 MED ORDER — CETIRIZINE HCL 10 MG PO TABS: 10.0000 mg | ORAL_TABLET | Freq: Every day | ORAL | 0 refills | Status: DC

## 2022-02-18 MED ORDER — BENZONATATE 100 MG PO CAPS: 100.0000 mg | ORAL_CAPSULE | Freq: Three times a day (TID) | ORAL | 0 refills | Status: DC | PRN

## 2022-02-18 NOTE — Discharge Instructions (Signed)
We will notify you of your test results as they arrive and may take between 24-48 hours.  I encourage you to sign up for MyChart if you have not already done so as this can be the easiest way for [place] to communicate results to you online or through a phone app.  Generally, we only contact you if it is a positive test result.  In the meantime, if you develop worsening symptoms including fever, chest pain, shortness of breath despite our current treatment plan then please report to the emergency room as this may be a sign of worsening status from possible viral infection.  Otherwise, we will manage this as a viral syndrome. For sore throat or cough try using a honey-based tea. Use 3 teaspoons of honey with juice squeezed from half lemon. Place shaved pieces of ginger into 1/2-1 cup of water and warm over stove top. Then mix the ingredients and repeat every 4 hours as needed. Please take Tylenol 500mg -650mg  every 6 hours for aches and pains, fevers. Hydrate very well with at least 2 liters of water. Eat light meals such as soups to replenish electrolytes and soft fruits, veggies. Start an antihistamine like Zyrtec for postnasal drainage, sinus congestion.  You can take this together with prednisone.  Use the cough medications as needed.

## 2022-02-18 NOTE — ED Provider Notes (Signed)
Sierra Brown   MRN: [id number] DOB: June 23, 1951  Subjective:   Sierra Brown is a 71 y.o. female presenting for 2 day history of acute onset persistent clear productive cough, chest congestion, sinus congestion, shortness of breath. Has COPD, is a smoker. Has a history of bronchitis. No chest pain, fever, body aches. Would like a COVID test. No history of CKD.  No current facility-administered medications for this encounter.  Current Outpatient Medications:    albuterol (VENTOLIN HFA) 108 (90 Base) MCG/ACT inhaler, Inhale 2 puffs into the lungs every 6 (six) hours as needed for wheezing or shortness of breath., Disp: 8 g, Rfl: 2   aspirin EC 81 MG tablet, Take 1 tablet (81 mg total) by mouth daily. Swallow whole., Disp: 90 tablet, Rfl: 3   rosuvastatin (CRESTOR) 40 MG tablet, Take 1 tablet (40 mg total) by mouth daily., Disp: 90 tablet, Rfl: 3   Spacer/Aero-Holding Chambers (AEROCHAMBER MV) inhaler, Use as instructed, Disp: 1 each, Rfl: 2   Allergies  Allergen Reactions   Atorvastatin Other (See Comments)    Myalgias   Codeine Nausea And Vomiting   Levofloxacin Nausea And Vomiting   Sulfonamide Derivatives Hives   Penicillins Hives and Rash   Sulfa Antibiotics Hives and Rash    Past Medical History:  Diagnosis Date   Allergy    allerlgic rhinitis   Cataract    COPD (chronic obstructive pulmonary disease) (HCC)    Pneumonia    Tobacco use disorder      Past Surgical History:  Procedure Laterality Date   CESAREAN SECTION     TONSILLECTOMY      Family History  Problem Relation Age of Onset   Ulcerative colitis Mother        colostomy   Hypertension Mother    Heart Problems Mother    Breast cancer Neg Hx     Social History   Tobacco Use   Smoking status: Every Day    Packs/day: 0.75    Years: 49.00    Total pack years: 36.75    Types: Cigarettes   Smokeless tobacco: Never  Vaping Use   Vaping Use: Never used  Substance Use Topics   Alcohol  use: Never   Drug use: Never    ROS   Objective:   Vitals: BP (!) 163/72   Pulse 68   Temp 98.1 F (36.7 C)   Resp 18   SpO2 98%   Physical Exam Constitutional:      General: She is not in acute distress.    Appearance: Normal appearance. She is well-developed. She is not ill-appearing, toxic-appearing or diaphoretic.  HENT:     Head: Normocephalic and atraumatic.     Nose: Nose normal.     Mouth/Throat:     Mouth: Mucous membranes are moist.  Eyes:     General: No scleral icterus.       Right eye: No discharge.        Left eye: No discharge.     Extraocular Movements: Extraocular movements intact.  Cardiovascular:     Rate and Rhythm: Normal rate and regular rhythm.     Heart sounds: Normal heart sounds. No murmur heard.    No friction rub. No gallop.  Pulmonary:     Effort: Pulmonary effort is normal. No respiratory distress.     Breath sounds: No stridor. No wheezing, rhonchi or rales.     Comments: Slight decrease in lung sounds.  Chest:     Chest  wall: No tenderness.  Skin:    General: Skin is warm and dry.  Neurological:     General: No focal deficit present.     Mental Status: She is alert and oriented to person, place, and time.  Psychiatric:        Mood and Affect: Mood normal.        Behavior: Behavior normal.    Recent Results (from the past 2160 hour(s))  Basic metabolic panel     Status: Abnormal   Collection Time: 01/13/22  9:37 PM  Result Value Ref Range   Sodium 140 135 - 145 mmol/L   Potassium 4.3 3.5 - 5.1 mmol/L   Chloride 110 98 - 111 mmol/L   CO2 26 22 - 32 mmol/L   Glucose, Bld 90 70 - 99 mg/dL    Comment: Glucose reference range applies only to samples taken after fasting for at least 8 hours.   BUN 20 8 - 23 mg/dL   Creatinine, Ser 2.70 0.44 - 1.00 mg/dL   Calcium 9.2 8.9 - 62.3 mg/dL   GFR, Estimated >76 >28 mL/min    Comment: (NOTE) Calculated using the CKD-EPI Creatinine Equation (2021)    Anion gap 4 (L) 5 - 15     Comment: Performed at [facility], [address] [place], [place] [address]  CBC     Status: None   Collection Time: 01/13/22  9:37 PM  Result Value Ref Range   WBC 7.6 4.0 - 10.5 K/uL   RBC 4.27 3.87 - 5.11 MIL/uL   Hemoglobin 13.4 12.0 - 15.0 g/dL   HCT 61.6 07.3 - 71.0 %   MCV 94.8 80.0 - 100.0 fL   MCH 31.4 26.0 - 34.0 pg   MCHC 33.1 30.0 - 36.0 g/dL   RDW 62.6 94.8 - 54.6 %   Platelets 252 150 - 400 K/uL   nRBC 0.0 0.0 - 0.2 %    Comment: Performed at [facility], [address] [place], [place] [address]  Troponin I (High Sensitivity)     Status: None   Collection Time: 01/13/22  9:37 PM  Result Value Ref Range   Troponin I (High Sensitivity) 4 <18 ng/L    Comment: (NOTE) Elevated high sensitivity troponin I (hsTnI) values and significant  changes across serial measurements may suggest ACS but many other  chronic and acute conditions are known to elevate hsTnI results.  Refer to the "Links" section for chest pain algorithms and additional  guidance. Performed at [facility], [address] [place], [place] [address]   Troponin I (High Sensitivity)     Status: None   Collection Time: 01/13/22 11:37 PM  Result Value Ref Range   Troponin I (High Sensitivity) 8 <18 ng/L    Comment: (NOTE) Elevated high sensitivity troponin I (hsTnI) values and significant  changes across serial measurements may suggest ACS but many other  chronic and acute conditions are known to elevate hsTnI results.  Refer to the "Links" section for chest pain algorithms and additional  guidance. Performed at [facility], [address] [place], [place] [address]      Assessment and Plan :   PDMP not reviewed this encounter.  1. Acute viral syndrome   2. Acute cough   3. Smoker   4. Chronic obstructive pulmonary disease, unspecified COPD type (HCC)    Recommended a oral prednisone course in the context of her COPD and respiratory symptoms.  Use  supportive care otherwise  for an acute viral syndrome.  COVID-19 testing pending.  Patient would be a good candidate for Paxlovid should she test positive. Counseled patient on potential for adverse effects with medications prescribed/recommended today, ER and return-to-clinic precautions discussed, patient verbalized understanding.    Wallis Bamberg, PA-C 02/18/22 1001

## 2022-02-18 NOTE — ED Triage Notes (Signed)
Patient presents to Urgent Care with complaints of productive cough, SOB, and congestion x 2 days. Treating symptoms with inhaler and OTC med. Hx of bronchitis.   Denies fever.

## 2022-03-02 ENCOUNTER — Telehealth: Payer: Self-pay

## 2022-03-02 NOTE — Telephone Encounter (Signed)
-----   Message from Jacky Kindle, FNP sent at 03/02/2022  7:49 AM EDT ----- Cologuard not completed; please remind pt  EP ----- Message ----- From: SYSTEM Sent: 03/01/2022  12:12 AM EDT To: Jacky Kindle, FNP

## 2022-03-02 NOTE — Telephone Encounter (Signed)
Patient called in sates, will get colorguard done next week, she has had death in the family, so that's why she hasn't doen it yet.

## 2022-03-02 NOTE — Progress Notes (Signed)
Called patient to remind them to do their cologuard as ordered. Okay for PEC to advise.

## 2022-04-02 ENCOUNTER — Ambulatory Visit
Admission: EM | Admit: 2022-04-02 | Discharge: 2022-04-02 | Disposition: A | Discharge status: Home / Self Care | Payer: Medicare HMO | Attending: Emergency Medicine | Admitting: Emergency Medicine

## 2022-04-02 ENCOUNTER — Encounter: Payer: Self-pay | Admitting: Emergency Medicine

## 2022-04-02 DIAGNOSIS — J302 Other seasonal allergic rhinitis: Secondary | ICD-10-CM

## 2022-04-02 DIAGNOSIS — R0982 Postnasal drip: Secondary | ICD-10-CM

## 2022-04-02 DIAGNOSIS — R0981 Nasal congestion: Secondary | ICD-10-CM

## 2022-04-02 MED ORDER — BENZONATATE 100 MG PO CAPS: 100.0000 mg | ORAL_CAPSULE | Freq: Three times a day (TID) | ORAL | 0 refills | Status: DC | PRN

## 2022-04-02 MED ORDER — CETIRIZINE HCL 10 MG PO TABS: 10.0000 mg | ORAL_TABLET | Freq: Every day | ORAL | 0 refills | Status: DC

## 2022-04-02 NOTE — ED Provider Notes (Signed)
Renaldo Fiddler    CSN: [id number] Arrival date & time: 04/02/22  5631      History   Chief Complaint Chief Complaint  Patient presents with   Cough   post nasal drip    HPI Sierra Brown is a 71 y.o. female.  Patient presents with postnasal drip, nasal congestion, sinus pressure, ear pain, hoarse voice, scratchy throat, mild cough since yesterday.  Treatment at home with Claritin yesterday; no OTC medications taken today.  She denies wheezing, shortness of breath, fever, chills, vomiting, diarrhea, or other symptoms.  Her medical history includes COPD, allergic rhinitis, hypertension, TIA, current everyday smoker.   The history is provided by the patient and medical records.    Past Medical History:  Diagnosis Date   Allergy    allerlgic rhinitis   Cataract    COPD (chronic obstructive pulmonary disease) (HCC)    Pneumonia    Tobacco use disorder     Patient Active Problem List   Diagnosis Date Noted   History of TIA (transient ischemic attack) 01/25/2022   Primary hypertension 12/01/2021   Tobacco dependence due to cigarettes 12/01/2021   Age-related osteoporosis without current pathological fracture 12/01/2021   Mildly underweight adult 12/01/2021   Encounter for screening for lung cancer 08/25/2021   Encounter for screening for osteoporosis 08/25/2021   Colon cancer screening 08/25/2021   Screening mammogram for breast cancer 08/25/2021   Low weight 08/25/2021   Encounter for lipid screening for cardiovascular disease 08/25/2021   Need for vaccination against Streptococcus pneumoniae 08/25/2021   Annual physical exam 08/25/2021   History of hypertension 08/25/2021   Aortic atherosclerosis (HCC) 08/25/2021   History of palpitations in adulthood 08/05/2020   Tobacco abuse 05/25/2007   Allergic rhinitis 10/24/2006    Past Surgical History:  Procedure Laterality Date   CESAREAN SECTION     TONSILLECTOMY      OB History   No obstetric history on  file.      Home Medications    Prior to Admission medications   Medication Sig Start Date End Date Taking? Authorizing Provider  benzonatate (TESSALON) 100 MG capsule Take 1 capsule (100 mg total) by mouth 3 (three) times daily as needed for cough. 04/02/22  Yes Mickie Bail, NP  cetirizine (ZYRTEC ALLERGY) 10 MG tablet Take 1 tablet (10 mg total) by mouth daily. 04/02/22  Yes Mickie Bail, NP  albuterol (VENTOLIN HFA) 108 (90 Base) MCG/ACT inhaler Inhale 2 puffs into the lungs every 6 (six) hours as needed for wheezing or shortness of breath. 06/15/21   Jacky Kindle, FNP  aspirin EC 81 MG tablet Take 1 tablet (81 mg total) by mouth daily. Swallow whole. 01/25/22   Jacky Kindle, FNP  rosuvastatin (CRESTOR) 40 MG tablet Take 1 tablet (40 mg total) by mouth daily. 01/25/22   Jacky Kindle, FNP  Spacer/Aero-Holding Chambers (AEROCHAMBER MV) inhaler Use as instructed 06/15/21   Jacky Kindle, FNP    Family History Family History  Problem Relation Age of Onset   Ulcerative colitis Mother        colostomy   Hypertension Mother    Heart Problems Mother    Breast cancer Neg Hx     Social History Social History   Tobacco Use   Smoking status: Every Day    Packs/day: 0.75    Years: 49.00    Total pack years: 36.75    Types: Cigarettes   Smokeless tobacco: Never  Vaping Use  Vaping Use: Never used  Substance Use Topics   Alcohol use: Never   Drug use: Never     Allergies   Atorvastatin, Codeine, Levofloxacin, Sulfonamide derivatives, Penicillins, and Sulfa antibiotics   Review of Systems Review of Systems  Constitutional:  Negative for chills and fever.  HENT:  Positive for congestion, ear pain, postnasal drip, sinus pressure, sore throat and voice change.   Respiratory:  Positive for cough. Negative for shortness of breath and wheezing.   Cardiovascular:  Negative for chest pain and palpitations.  Gastrointestinal:  Negative for diarrhea and vomiting.  Skin:  Negative  for color change and rash.  All other systems reviewed and are negative.    Physical Exam Triage Vital Signs ED Triage Vitals  Enc Vitals Group     BP      Pulse      Resp      Temp      Temp src      SpO2      Weight      Height      Head Circumference      Peak Flow      Pain Score      Pain Loc      Pain Edu?      Excl. in GC?    No data found.  Updated Vital Signs BP (!) 145/75   Pulse 85   Temp 98.1 F (36.7 C)   Resp 18   SpO2 97%   Visual Acuity Right Eye Distance:   Left Eye Distance:   Bilateral Distance:    Right Eye Near:   Left Eye Near:    Bilateral Near:     Physical Exam Vitals and nursing note reviewed.  Constitutional:      General: She is not in acute distress.    Appearance: She is well-developed. She is not ill-appearing.  HENT:     Right Ear: Tympanic membrane normal.     Left Ear: Tympanic membrane normal.     Nose: Congestion and rhinorrhea present.     Mouth/Throat:     Mouth: Mucous membranes are moist.     Pharynx: Oropharynx is clear.     Comments: Clear PND. Cardiovascular:     Rate and Rhythm: Normal rate and regular rhythm.     Heart sounds: Normal heart sounds.  Pulmonary:     Effort: Pulmonary effort is normal. No respiratory distress.     Breath sounds: Normal breath sounds. No wheezing.  Musculoskeletal:     Cervical back: Neck supple.  Skin:    General: Skin is warm and dry.  Neurological:     Mental Status: She is alert.  Psychiatric:        Mood and Affect: Mood normal.        Behavior: Behavior normal.      UC Treatments / Results  Labs (all labs ordered are listed, but only abnormal results are displayed) Labs Reviewed - No data to display  EKG   Radiology No results found.  Procedures Procedures (including critical care time)  Medications Ordered in UC Medications - No data to display  Initial Impression / Assessment and Plan / UC Course  I have reviewed the triage vital signs and the  nursing notes.  Pertinent labs & imaging results that were available during my care of the patient were reviewed by me and considered in my medical decision making (see chart for details).    Seasonal allergies, nasal congestion, postnasal  drip.  Symptom onset yesterday.  Afebrile and vital signs are stable.  Treating with benzonatate and cetirizine.  Instructed patient to follow up with her PCP if her symptoms are not improving.  Education provided on allergic rhinitis and postnasal drip.  She agrees to plan of care.    Final Clinical Impressions(s) / UC Diagnoses   Final diagnoses:  Seasonal allergies  Nasal congestion  Postnasal drip     Discharge Instructions      Take the benzonatate and cetirizine as directed.    Follow up with your primary care provider if your symptoms are not improving.        ED Prescriptions     Medication Sig Dispense Auth. Provider   benzonatate (TESSALON) 100 MG capsule Take 1 capsule (100 mg total) by mouth 3 (three) times daily as needed for cough. 21 capsule Sharion Balloon, NP   cetirizine (ZYRTEC ALLERGY) 10 MG tablet Take 1 tablet (10 mg total) by mouth daily. 30 tablet Sharion Balloon, NP      PDMP not reviewed this encounter.   Sharion Balloon, NP 04/02/22 [phone]

## 2022-04-02 NOTE — ED Triage Notes (Signed)
Pt reports head congestion, sinus congestion, pressure behind both ears, post nasal drip and laryngitis. States symptoms began yesterday.  States took Claritin, tylenol and uses a nasal saline spray for relief.

## 2022-04-02 NOTE — Discharge Instructions (Addendum)
Take the benzonatate and cetirizine as directed.    Follow up with your primary care provider if your symptoms are not improving.

## 2022-04-04 ENCOUNTER — Encounter: Payer: Self-pay | Admitting: Physician Assistant

## 2022-04-04 ENCOUNTER — Ambulatory Visit (INDEPENDENT_AMBULATORY_CARE_PROVIDER_SITE_OTHER): Payer: Medicare HMO | Admitting: Physician Assistant

## 2022-04-04 VITALS — BP 128/73 | HR 83 | Temp 98.2°F | Resp 16 | Wt 100.6 lb

## 2022-04-04 DIAGNOSIS — J0110 Acute frontal sinusitis, unspecified: Secondary | ICD-10-CM

## 2022-04-04 MED ORDER — DOXYCYCLINE HYCLATE 100 MG PO TABS: 100.0000 mg | ORAL_TABLET | Freq: Two times a day (BID) | ORAL | 0 refills | Status: AC

## 2022-04-04 NOTE — Progress Notes (Signed)
I,April Miller,acting as a Education administrator for [employer], PA-C.,have documented all relevant documentation on the behalf of Mikey Kirschner, PA-C,as directed by  Mikey Kirschner, PA-C while in the presence of Mikey Kirschner, PA-C.   Established patient visit   Patient: Sierra Brown   DOB: Nov 22, 1950   71 y.o. Female  MRN: [id number] Visit Date: 04/04/2022  Today's healthcare provider: Mikey Kirschner, PA-C   Chief Complaint  Patient presents with   Follow-up   Subjective    HPI   Pt reports nasal congestion, sore throat, ear fullness for the last 5-6 days. She went to urgent care 04/02/22 and was dx with seasonal allergies and was given benzonatate was told to take an antihistamine otc--- taking claritin. Reports her symptoms have only worsened. Denies fevers.   Medications: Outpatient Medications Prior to Visit  Medication Sig   albuterol (VENTOLIN HFA) 108 (90 Base) MCG/ACT inhaler Inhale 2 puffs into the lungs every 6 (six) hours as needed for wheezing or shortness of breath.   aspirin EC 81 MG tablet Take 1 tablet (81 mg total) by mouth daily. Swallow whole.   benzonatate (TESSALON) 100 MG capsule Take 1 capsule (100 mg total) by mouth 3 (three) times daily as needed for cough.   rosuvastatin (CRESTOR) 40 MG tablet Take 1 tablet (40 mg total) by mouth daily.   Spacer/Aero-Holding Chambers (AEROCHAMBER MV) inhaler Use as instructed   cetirizine (ZYRTEC ALLERGY) 10 MG tablet Take 1 tablet (10 mg total) by mouth daily. (Patient not taking: Reported on 04/04/2022)   No facility-administered medications prior to visit.    Review of Systems  Constitutional:  Positive for fatigue. Negative for appetite change, chills and fever.  HENT:  Positive for congestion, hearing loss, postnasal drip, rhinorrhea, sinus pressure, sinus pain and sore throat.   Respiratory:  Positive for cough. Negative for chest tightness and shortness of breath.   Cardiovascular:  Negative for chest pain and  palpitations.  Gastrointestinal:  Negative for abdominal pain, nausea and vomiting.  Neurological:  Positive for headaches. Negative for dizziness and weakness.     Objective    BP 128/73 (BP Location: Right Arm, Patient Position: Sitting, Cuff Size: Normal)   Pulse 83   Temp 98.2 F (36.8 C) (Oral)   Resp 16   Wt 100 lb 9.6 oz (45.6 kg)   SpO2 98%   BMI 16.74 kg/m    Physical Exam Constitutional:      General: She is awake.     Appearance: She is well-developed.  HENT:     Head: Normocephalic.     Comments: Tenderness to frontal and maxillary sinuses    Right Ear: There is impacted cerumen.     Left Ear: There is impacted cerumen.     Nose: Congestion and rhinorrhea present.     Mouth/Throat:     Pharynx: Posterior oropharyngeal erythema present. No oropharyngeal exudate.  Eyes:     Conjunctiva/sclera: Conjunctivae normal.  Cardiovascular:     Rate and Rhythm: Normal rate and regular rhythm.     Heart sounds: Normal heart sounds.  Pulmonary:     Effort: Pulmonary effort is normal.     Breath sounds: Normal breath sounds.  Skin:    General: Skin is warm.  Neurological:     Mental Status: She is alert and oriented to person, place, and time.  Psychiatric:        Attention and Perception: Attention normal.        Mood and  Affect: Mood normal.        Speech: Speech normal.        Behavior: Behavior is cooperative.       No results found for any visits on 04/04/22.  Assessment & Plan     Acute sinusitis Pt has allergies on chart to pcns, levaquin -- rx doxycycline bid x 7 days advised to take with food Continue claritin otc, can add her advil allergy and sinus -- but if taking multiple times a day, skip the claritin.  F/u if no improvement after treatment  Return if symptoms worsen or fail to improve.      I, Alfredia Ferguson, PA-C have reviewed all documentation for this visit. The documentation on  04/04/2022 for the exam, diagnosis, procedures, and orders are  all accurate and complete.  Alfredia Ferguson, PA-C [facility] [address] [place], [place], [address] Office: [phone] Fax: [phone]   [facility]

## 2022-04-11 ENCOUNTER — Ambulatory Visit (INDEPENDENT_AMBULATORY_CARE_PROVIDER_SITE_OTHER): Payer: Medicare HMO | Admitting: Family Medicine

## 2022-04-11 ENCOUNTER — Encounter: Payer: Self-pay | Admitting: Family Medicine

## 2022-04-11 VITALS — BP 132/70 | HR 88 | Temp 97.9°F | Resp 16 | Ht 64.0 in | Wt 101.0 lb

## 2022-04-11 DIAGNOSIS — J302 Other seasonal allergic rhinitis: Secondary | ICD-10-CM

## 2022-04-11 DIAGNOSIS — H6123 Impacted cerumen, bilateral: Secondary | ICD-10-CM

## 2022-04-11 NOTE — Progress Notes (Unsigned)
Established patient visit   Patient: Sierra Brown   DOB: Jan 19, 1951   71 y.o. Female  MRN: [id number] Visit Date: 04/11/2022  Today's healthcare provider: Jacky Kindle, FNP  Introduced to nurse practitioner role and practice setting.  All questions answered.  Discussed provider/patient relationship and expectations.  I,Yalena Colon J Miken Stecher,acting as a scribe for Jacky Kindle, FNP.,have documented all relevant documentation on the behalf of Jacky Kindle, FNP,as directed by  Jacky Kindle, FNP while in the presence of Jacky Kindle, FNP.   Chief Complaint  Patient presents with   Ear Fullness    Patient complains of ear and sinus congestion for a week. Saw Mardella Layman last week and took an antibiotic with little improvement.    Subjective    HPI HPI     Ear Fullness    Additional comments: Patient complains of ear and sinus congestion for a week. Saw Mardella Layman last week and took an antibiotic with little improvement.       Last edited by Marlana Salvage, CMA on 04/11/2022 11:14 AM.       Medications: Outpatient Medications Prior to Visit  Medication Sig   albuterol (VENTOLIN HFA) 108 (90 Base) MCG/ACT inhaler Inhale 2 puffs into the lungs every 6 (six) hours as needed for wheezing or shortness of breath.   aspirin EC 81 MG tablet Take 1 tablet (81 mg total) by mouth daily. Swallow whole.   benzonatate (TESSALON) 100 MG capsule Take 1 capsule (100 mg total) by mouth 3 (three) times daily as needed for cough.   cetirizine (ZYRTEC ALLERGY) 10 MG tablet Take 1 tablet (10 mg total) by mouth daily.   [EXPIRED] doxycycline (VIBRA-TABS) 100 MG tablet Take 1 tablet (100 mg total) by mouth 2 (two) times daily for 7 days.   rosuvastatin (CRESTOR) 40 MG tablet Take 1 tablet (40 mg total) by mouth daily.   Spacer/Aero-Holding Chambers (AEROCHAMBER MV) inhaler Use as instructed   No facility-administered medications prior to visit.    Review of Systems    Objective    BP 132/70 (BP  Location: Left Arm, Patient Position: Sitting, Cuff Size: Normal)   Pulse 88   Temp 97.9 F (36.6 C) (Oral)   Resp 16   Ht 5\' 4"  (1.626 m)   Wt 101 lb (45.8 kg)   SpO2 98%   BMI 17.34 kg/m   Physical Exam Vitals and nursing note reviewed.  Constitutional:      General: She is not in acute distress.    Appearance: Normal appearance. She is underweight. She is not ill-appearing, toxic-appearing or diaphoretic.  HENT:     Head: Normocephalic and atraumatic.     Right Ear: Decreased hearing noted. Tenderness present. There is impacted cerumen.     Left Ear: Decreased hearing noted. There is impacted cerumen.     Ears:     Comments: Full impaction and resolution of reduced hearing with improved complaints of fullness/tenderness s/p irrigation Cardiovascular:     Rate and Rhythm: Normal rate and regular rhythm.     Pulses: Normal pulses.     Heart sounds: Normal heart sounds. No murmur heard.    No friction rub. No gallop.  Pulmonary:     Effort: Pulmonary effort is normal. No respiratory distress.     Breath sounds: Normal breath sounds. No stridor. No wheezing, rhonchi or rales.  Chest:     Chest wall: No tenderness.  Abdominal:     General: Bowel sounds  are normal.     Palpations: Abdomen is soft.  Musculoskeletal:        General: No swelling, tenderness, deformity or signs of injury. Normal range of motion.     Right lower leg: No edema.     Left lower leg: No edema.  Skin:    General: Skin is warm and dry.     Capillary Refill: Capillary refill takes less than 2 seconds.     Coloration: Skin is not jaundiced or pale.     Findings: No bruising, erythema, lesion or rash.  Neurological:     General: No focal deficit present.     Mental Status: She is alert and oriented to person, place, and time. Mental status is at baseline.     Cranial Nerves: No cranial nerve deficit.     Sensory: No sensory deficit.     Motor: No weakness.     Coordination: Coordination normal.   Psychiatric:        Mood and Affect: Mood normal.        Behavior: Behavior normal.        Thought Content: Thought content normal.        Judgment: Judgment normal.     No results found for any visits on 04/11/22.  Assessment & Plan     Problem List Items Addressed This Visit       Respiratory   Seasonal allergic rhinitis    Acute on chronic, notes both Spring and Fall allergies; continues to use ABX for sinusitis Has been using OTC medication to assist with symptom relief Recommend addition of Coricidan HP as well as flonase Previously refused allergy testing If becomes recurrent this season, recommend ENT as second opinion         Nervous and Auditory   Impacted cerumen of both ears - Primary    Successful lavage via irrigation and removal; immediate relief Patient tolerated well      Return if symptoms worsen or fail to improve.     Vonna Kotyk, FNP, have reviewed all documentation for this visit. The documentation on 04/13/22 for the exam, diagnosis, procedures, and orders are all accurate and complete.  Gwyneth Sprout, [place] [phone] (phone) [phone] (fax)  [place]

## 2022-04-13 ENCOUNTER — Encounter: Payer: Self-pay | Admitting: Family Medicine

## 2022-04-13 DIAGNOSIS — H6123 Impacted cerumen, bilateral: Secondary | ICD-10-CM | POA: Insufficient documentation

## 2022-04-13 NOTE — Assessment & Plan Note (Signed)
Acute on chronic, notes both Spring and Fall allergies; continues to use ABX for sinusitis Has been using OTC medication to assist with symptom relief Recommend addition of Coricidan HP as well as flonase Previously refused allergy testing If becomes recurrent this season, recommend ENT as second opinion

## 2022-04-13 NOTE — Assessment & Plan Note (Signed)
Successful lavage via irrigation and removal; immediate relief Patient tolerated well

## 2022-06-02 NOTE — Progress Notes (Deleted)
Complete physical exam   Patient: Sierra Brown   DOB: 07/04/1950   71 y.o. Female  MRN: [id number] Visit Date: 06/03/2022  Today's healthcare provider: Jacky Kindle, FNP   No chief complaint on file.  Subjective    Sierra Brown is a 71 y.o. female who presents today for a complete physical exam.  She reports consuming a {diet types:17450} diet. {Exercise:19826} She generally feels {well/fairly well/poorly:18703}. She reports sleeping {well/fairly well/poorly:18703}. She {does/does not:200015} have additional problems to discuss today.  HPI  ***  Past Medical History:  Diagnosis Date   Allergy    allerlgic rhinitis   Cataract    COPD (chronic obstructive pulmonary disease) (HCC)    Pneumonia    Tobacco use disorder    Past Surgical History:  Procedure Laterality Date   CESAREAN SECTION     TONSILLECTOMY     Social History   Socioeconomic History   Marital status: Married    Spouse name: Not on file   Number of children: Not on file   Years of education: Not on file   Highest education level: Not on file  Occupational History   Not on file  Tobacco Use   Smoking status: Every Day    Packs/day: 0.75    Years: 49.00    Total pack years: 36.75    Types: Cigarettes   Smokeless tobacco: Never  Vaping Use   Vaping Use: Never used  Substance and Sexual Activity   Alcohol use: Never   Drug use: Never   Sexual activity: Not on file  Other Topics Concern   Not on file  Social History Narrative   Not on file   Social Determinants of Health   Financial Resource Strain: Low Risk  (02/17/2022)   Overall Financial Resource Strain (CARDIA)    Difficulty of Paying Living Expenses: Not hard at all  Food Insecurity: No Food Insecurity (02/17/2022)   Hunger Vital Sign    Worried About Running Out of Food in the Last Year: Never true    Ran Out of Food in the Last Year: Never true  Transportation Needs: No Transportation Needs (02/17/2022)   PRAPARE -  Administrator, Civil Service (Medical): No    Lack of Transportation (Non-Medical): No  Physical Activity: Sufficiently Active (02/17/2022)   Exercise Vital Sign    Days of Exercise per Week: 5 days    Minutes of Exercise per Session: 60 min  Stress: No Stress Concern Present (02/17/2022)   [employer] of Occupational Health - Occupational Stress Questionnaire    Feeling of Stress : Not at all  Social Connections: Moderately Integrated (02/17/2022)   Social Connection and Isolation Panel [NHANES]    Frequency of Communication with Friends and Family: More than three times a week    Frequency of Social Gatherings with Friends and Family: More than three times a week    Attends Religious Services: More than 4 times per year    Active Member of [employer] or Organizations: No    Attends Banker Meetings: Never    Marital Status: Married  Catering manager Violence: Not At Risk (02/17/2022)   Humiliation, Afraid, Rape, and Kick questionnaire    Fear of Current or Ex-Partner: No    Emotionally Abused: No    Physically Abused: No    Sexually Abused: No   Family Status  Relation Name Status   Mother  (Not Specified)  colostomy   Neg Hx  (Not Specified)   Family History  Problem Relation Age of Onset   Ulcerative colitis Mother        colostomy   Hypertension Mother    Heart Problems Mother    Breast cancer Neg Hx    Allergies  Allergen Reactions   Atorvastatin Other (See Comments)    Myalgias   Codeine Nausea And Vomiting   Levofloxacin Nausea And Vomiting   Sulfonamide Derivatives Hives   Penicillins Hives and Rash   Sulfa Antibiotics Hives and Rash    Patient Care Team: Jacky Kindle, FNP as PCP - General (Family Medicine) Chrystie Nose, MD as PCP - Cardiology (Cardiology)   Medications: Outpatient Medications Prior to Visit  Medication Sig   albuterol (VENTOLIN HFA) 108 (90 Base) MCG/ACT inhaler Inhale 2 puffs into the lungs every 6  (six) hours as needed for wheezing or shortness of breath.   aspirin EC 81 MG tablet Take 1 tablet (81 mg total) by mouth daily. Swallow whole.   benzonatate (TESSALON) 100 MG capsule Take 1 capsule (100 mg total) by mouth 3 (three) times daily as needed for cough.   cetirizine (ZYRTEC ALLERGY) 10 MG tablet Take 1 tablet (10 mg total) by mouth daily.   rosuvastatin (CRESTOR) 40 MG tablet Take 1 tablet (40 mg total) by mouth daily.   Spacer/Aero-Holding Chambers (AEROCHAMBER MV) inhaler Use as instructed   No facility-administered medications prior to visit.    Review of Systems  {Labs  Heme  Chem  Endocrine  Serology  Results Review (optional):23779}  Objective    There were no vitals taken for this visit. {Show previous vital signs (optional):23777}   Physical Exam  ***  Last depression screening scores    04/11/2022   11:15 AM 04/04/2022    1:29 PM 02/17/2022    8:14 AM  PHQ 2/9 Scores  PHQ - 2 Score 0 0 0  PHQ- 9 Score 3 0 0   Last fall risk screening    04/11/2022   11:15 AM  Fall Risk   Falls in the past year? 0  Number falls in past yr: 0  Injury with Fall? 0  Risk for fall due to : No Fall Risks  Follow up Falls evaluation completed   Last Audit-C alcohol use screening    04/11/2022   11:16 AM  Alcohol Use Disorder Test (AUDIT)  1. How often do you have a drink containing alcohol? 0  2. How many drinks containing alcohol do you have on a typical day when you are drinking? 0  3. How often do you have six or more drinks on one occasion? 0  AUDIT-C Score 0   A score of 3 or more in women, and 4 or more in men indicates increased risk for alcohol abuse, EXCEPT if all of the points are from question 1   No results found for any visits on 06/03/22.  Assessment & Plan    Routine Health Maintenance and Physical Exam  Exercise Activities and Dietary recommendations  Goals      DIET - EAT MORE FRUITS AND VEGETABLES        Immunization History   Administered Date(s) Administered   Influenza Whole 03/28/2007, 04/08/2010   Influenza-Unspecified 03/27/2013, 04/27/2021   PNEUMOCOCCAL CONJUGATE-20 08/25/2021   Pneumococcal Polysaccharide-23 08/01/2007   Td 06/27/1997    Health Maintenance  Topic Date Due   COVID-19 Vaccine (1) Never done   COLONOSCOPY (Pts 45-74yrs Insurance  coverage will need to be confirmed)  Never done   Zoster Vaccines- Shingrix (1 of 2) Never done   DTaP/Tdap/Td (2 - Tdap) 06/28/2007   INFLUENZA VACCINE  09/25/2022 (Originally 01/25/2022)   MAMMOGRAM  10/15/2022   Lung Cancer Screening  11/06/2022   Medicare Annual Wellness (AWV)  02/18/2023   Pneumonia Vaccine 96+ Years old  Completed   DEXA SCAN  Completed   HPV VACCINES  Aged Out   Hepatitis C Screening  Discontinued    Discussed health benefits of physical activity, and encouraged her to engage in regular exercise appropriate for her age and condition.  ***  No follow-ups on file.     {provider attestation***:1}   Jacky Kindle, FNP  [facility] [phone] (phone) [phone] (fax)  [facility]

## 2022-06-03 ENCOUNTER — Encounter: Payer: Self-pay | Admitting: Family Medicine

## 2022-06-03 ENCOUNTER — Ambulatory Visit (INDEPENDENT_AMBULATORY_CARE_PROVIDER_SITE_OTHER): Payer: Medicare HMO | Admitting: Family Medicine

## 2022-06-03 VITALS — BP 134/59 | HR 65 | Temp 97.8°F | Wt 104.5 lb

## 2022-06-03 DIAGNOSIS — R0602 Shortness of breath: Secondary | ICD-10-CM | POA: Insufficient documentation

## 2022-06-03 DIAGNOSIS — Z8673 Personal history of transient ischemic attack (TIA), and cerebral infarction without residual deficits: Secondary | ICD-10-CM

## 2022-06-03 DIAGNOSIS — Z720 Tobacco use: Secondary | ICD-10-CM

## 2022-06-03 DIAGNOSIS — I700 Atherosclerosis of aorta: Secondary | ICD-10-CM | POA: Diagnosis not present

## 2022-06-03 MED ORDER — ROSUVASTATIN CALCIUM 40 MG PO TABS: 40.0000 mg | ORAL_TABLET | ORAL | 3 refills | Status: DC

## 2022-06-03 MED ORDER — ALBUTEROL SULFATE HFA 108 (90 BASE) MCG/ACT IN AERS: 2.0000 | INHALATION_SPRAY | Freq: Four times a day (QID) | RESPIRATORY_TRACT | 2 refills | Status: DC | PRN

## 2022-06-03 NOTE — Progress Notes (Addendum)
I,Connie R Striblin,acting as a Neurosurgeon for Jacky Kindle, FNP.,have documented all relevant documentation on the behalf of Jacky Kindle, FNP,as directed by  Jacky Kindle, FNP while in the presence of Jacky Kindle, FNP.  Established patient visit   Patient: Sierra Brown   DOB: February 24, 1951   71 y.o. Female  MRN: [id number] Visit Date: 06/03/2022  Today's healthcare provider: Jacky Kindle, FNP  Re Introduced to nurse practitioner role and practice setting.  All questions answered.  Discussed provider/patient relationship and expectations.  Subjective    HPI  Hypertension, follow-up  BP Readings from Last 3 Encounters:  06/03/22 (!) 134/59  04/11/22 132/70  04/04/22 128/73   Wt Readings from Last 3 Encounters:  06/03/22 104 lb 8 oz (47.4 kg)  04/11/22 101 lb (45.8 kg)  04/04/22 100 lb 9.6 oz (45.6 kg)     She was last seen for hypertension 6 months ago.  BP at that visit was 148 56. Management since that visit includes monitoring at home.  She reports excellent compliance with treatment. She is having side effects. Leg pain and cramps She is following a Regular diet. She is exercising. Active at barn- 3 horses. Use of agents associated with hypertension: none.   Outside blood pressures are not being taken . Symptoms: No chest pain No chest pressure  No palpitations No syncope  Yes dyspnea No orthopnea  No paroxysmal nocturnal dyspnea No lower extremity edema     Pt has no other concerns today Pertinent labs Lab Results  Component Value Date   CHOL 170 08/25/2021   HDL 65 08/25/2021   LDLCALC 85 08/25/2021   TRIG 112 08/25/2021   CHOLHDL 2.6 08/25/2021   Lab Results  Component Value Date   NA 140 01/13/2022   K 4.3 01/13/2022   CREATININE 0.97 01/13/2022   GFRNONAA >60 01/13/2022   GLUCOSE 90 01/13/2022   TSH 1.250 01/20/2021     The 10-year ASCVD risk score (Arnett DK, et al., 2019) is:  13.9%  ---------------------------------------------------------------------------------------------------   Medications: Outpatient Medications Prior to Visit  Medication Sig   aspirin EC 81 MG tablet Take 1 tablet (81 mg total) by mouth daily. Swallow whole.   Spacer/Aero-Holding Chambers (AEROCHAMBER MV) inhaler Use as instructed   [DISCONTINUED] albuterol (VENTOLIN HFA) 108 (90 Base) MCG/ACT inhaler Inhale 2 puffs into the lungs every 6 (six) hours as needed for wheezing or shortness of breath.   [DISCONTINUED] rosuvastatin (CRESTOR) 40 MG tablet Take 1 tablet (40 mg total) by mouth daily.   [DISCONTINUED] benzonatate (TESSALON) 100 MG capsule Take 1 capsule (100 mg total) by mouth 3 (three) times daily as needed for cough. (Patient not taking: Reported on 06/03/2022)   [DISCONTINUED] cetirizine (ZYRTEC ALLERGY) 10 MG tablet Take 1 tablet (10 mg total) by mouth daily. (Patient not taking: Reported on 06/03/2022)   No facility-administered medications prior to visit.    Review of Systems    Objective    BP (!) 134/59 (BP Location: Right Arm, Patient Position: Sitting, Cuff Size: Normal)   Pulse 65   Temp 97.8 F (36.6 C)   Wt 104 lb 8 oz (47.4 kg)   SpO2 100%   BMI 17.94 kg/m   Physical Exam Vitals and nursing note reviewed.  Constitutional:      General: She is not in acute distress.    Appearance: Normal appearance. She is overweight. She is not ill-appearing, toxic-appearing or diaphoretic.  HENT:     Head:  Normocephalic and atraumatic.  Cardiovascular:     Rate and Rhythm: Normal rate and regular rhythm.     Pulses: Normal pulses.     Heart sounds: Normal heart sounds. No murmur heard.    No friction rub. No gallop.  Pulmonary:     Effort: Pulmonary effort is normal. No respiratory distress.     Breath sounds: Normal breath sounds. No stridor. No wheezing, rhonchi or rales.  Chest:     Chest wall: No tenderness.  Abdominal:     General: Bowel sounds are normal.      Palpations: Abdomen is soft.  Musculoskeletal:        General: No swelling, tenderness, deformity or signs of injury. Normal range of motion.     Right lower leg: No edema.     Left lower leg: No edema.  Skin:    General: Skin is warm and dry.     Capillary Refill: Capillary refill takes less than 2 seconds.     Coloration: Skin is not jaundiced or pale.     Findings: No bruising, erythema, lesion or rash.  Neurological:     General: No focal deficit present.     Mental Status: She is alert and oriented to person, place, and time. Mental status is at baseline.     Cranial Nerves: No cranial nerve deficit.     Sensory: No sensory deficit.     Motor: No weakness.     Coordination: Coordination normal.  Psychiatric:        Mood and Affect: Mood normal.        Behavior: Behavior normal.        Thought Content: Thought content normal.        Judgment: Judgment normal.    No results found for any visits on 06/03/22.  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 Previous LDL stable; defer labs for 1 year f/u OK to switch to every other day. Ideal if medication can assist in prevention of further TIAs or CVA/MI as previously discussed Pt agreeable with POC      Relevant Medications   rosuvastatin (CRESTOR) 40 MG tablet     Other   History of TIA (transient ischemic attack)   Relevant Medications   rosuvastatin (CRESTOR) 40 MG tablet   Shortness of breath    In setting of ASCVD and tobacco use disorder; request for refills       Relevant Medications   albuterol (VENTOLIN HFA) 108 (90 Base) MCG/ACT inhaler   Tobacco abuse    Chronic, stale 3/4 ppd for 50 years Remains pre  contemplative regarding cessation      Relevant Medications   rosuvastatin (CRESTOR) 40 MG tablet   Return in about 4 months (around 10/03/2022) for chonic disease management.     Leilani Merl, FNP, have reviewed all documentation for this visit. The documentation on 06/03/22 for the exam, diagnosis, procedures, and orders are all accurate and complete.  Jacky Kindle, FNP  [facility] [phone] (phone) [phone] (fax)  [facility]

## 2022-06-03 NOTE — Assessment & Plan Note (Signed)
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 Previous LDL stable; defer labs for 1 year f/u OK to switch to every other day. Ideal if medication can assist in prevention of further TIAs or CVA/MI as previously discussed Pt agreeable with POC

## 2022-06-03 NOTE — Assessment & Plan Note (Signed)
In setting of ASCVD and tobacco use disorder; request for refills

## 2022-06-03 NOTE — Assessment & Plan Note (Signed)
Chronic, stale 3/4 ppd for 50 years Remains pre contemplative regarding cessation

## 2022-06-16 DIAGNOSIS — Z03818 Encounter for observation for suspected exposure to other biological agents ruled out: Secondary | ICD-10-CM | POA: Diagnosis not present

## 2022-06-16 DIAGNOSIS — J101 Influenza due to other identified influenza virus with other respiratory manifestations: Secondary | ICD-10-CM | POA: Diagnosis not present

## 2022-06-16 DIAGNOSIS — J441 Chronic obstructive pulmonary disease with (acute) exacerbation: Secondary | ICD-10-CM | POA: Diagnosis not present

## 2022-07-11 ENCOUNTER — Ambulatory Visit: Payer: Self-pay | Admitting: *Deleted

## 2022-07-11 ENCOUNTER — Ambulatory Visit (INDEPENDENT_AMBULATORY_CARE_PROVIDER_SITE_OTHER): Payer: Medicare HMO | Admitting: Physician Assistant

## 2022-07-11 VITALS — BP 122/65 | HR 78 | Wt 99.6 lb

## 2022-07-11 DIAGNOSIS — Z720 Tobacco use: Secondary | ICD-10-CM

## 2022-07-11 DIAGNOSIS — R0602 Shortness of breath: Secondary | ICD-10-CM

## 2022-07-11 NOTE — Progress Notes (Signed)
I,Sha'taria Tyson,acting as a Education administrator for [employer], PA-C.,have documented all relevant documentation on the behalf of Mardene Speak, PA-C,as directed by  [employer], PA-C while in the presence of [employer], PA-C.    Established patient visit   Patient: Sierra Brown   DOB: 03/10/1951   72 y.o. Female  MRN: [id number] Visit Date: 07/11/2022  Today's healthcare provider: Mardene Speak, PA-C   Chief Complaint  Patient presents with  . Shortness of Breath   Subjective    HPI  Chief Complaint: Shortness of breath Symptoms: Had flu before Christmas also bronchitis, it's not going away and I have COPD.   Coughing up thick mucus.   Other URI symptoms resolved from having the flu. Frequency: Shortness of breath becoming worse over last 2-3 days evan at rest. Pertinent Negatives: Patient denies fever   Reason for Disposition . [1] MILD difficulty breathing (e.g., minimal/no SOB at rest, SOB with walking, pulse <100) AND [2] NEW-onset or WORSE than normal  Answer Assessment - Initial Assessment Questions 1. RESPIRATORY STATUS: "Describe your breathing?" (e.g., wheezing, shortness of breath, unable to speak, severe coughing)      I went to a walk in clinic before Christmas.  I had the flu.     I'm short of breath.   I have COPD.    The bronchitis is lingering and I'm tired. 2. ONSET: "When did this breathing problem begin?"      Started before Christmas.    I was exposed to my sick grand child.    3. PATTERN "Does the difficult breathing come and go, or has it been constant since it started?"      Shortness of breath even at rest. 4. SEVERITY: "How bad is your breathing?" (e.g., mild, moderate, severe)    - MILD: No SOB at rest, mild SOB with walking, speaks normally in sentences, can lie down, no retractions, pulse < 100.    - MODERATE: SOB at rest, SOB with minimal exertion and prefers to sit, cannot lie down flat, speaks in phrases, mild retractions, audible wheezing,  pulse 100-120.    - SEVERE: Very SOB at rest, speaks in single words, struggling to breathe, sitting hunched forward, retractions, pulse > 120      Moderate 5. RECURRENT SYMPTOM: "Have you had difficulty breathing before?" If Yes, ask: "When was the last time?" and "What happened that time?"      Yes 6. CARDIAC HISTORY: "Do you have any history of heart disease?" (e.g., heart attack, angina, bypass surgery, angioplasty)      *No Answer* 7. LUNG HISTORY: "Do you have any history of lung disease?"  (e.g., pulmonary embolus, asthma, emphysema)     COPD 8. CAUSE: "What do you think is causing the breathing problem?"      Bronchitis 9. OTHER SYMPTOMS: "Do you have any other symptoms? (e.g., dizziness, runny nose, cough, chest pain, fever)     No 10. O2 SATURATION MONITOR:  "Do you use an oxygen saturation monitor (pulse oximeter) at home?" If Yes, ask: "What is your reading (oxygen level) today?" "What is your usual oxygen saturation reading?" (e.g., 95%)       Not asked 11. PREGNANCY: "Is there any chance you are pregnant?" "When was your last menstrual period?"       N/AA 12. TRAVEL: "Have you traveled out of the country in the last month?" (e.g., travel history, exposures)       Yes exposure  Reason for Disposition . [  1] MILD difficulty breathing (e.g., minimal/no SOB at rest, SOB with walking, pulse <100) AND [2] NEW-onset or WORSE than normal  Answer Assessment - Initial Assessment Questions 1. RESPIRATORY STATUS: "Describe your breathing?" (e.g., wheezing, shortness of breath, unable to speak, severe coughing)      I went to a walk in clinic before Christmas.  I had the flu.     I'm short of breath.   I have COPD.    The bronchitis is lingering and I'm tired. 2. ONSET: "When did this breathing problem begin?"      Started before Christmas.    I was exposed to my sick grand child.    3. PATTERN "Does the difficult breathing come and go, or has it been constant since it started?"       Shortness of breath even at rest. 4. SEVERITY: "How bad is your breathing?" (e.g., mild, moderate, severe)    - MILD: No SOB at rest, mild SOB with walking, speaks normally in sentences, can lie down, no retractions, pulse < 100.    - MODERATE: SOB at rest, SOB with minimal exertion and prefers to sit, cannot lie down flat, speaks in phrases, mild retractions, audible wheezing, pulse 100-120.    - SEVERE: Very SOB at rest, speaks in single words, struggling to breathe, sitting hunched forward, retractions, pulse > 120      Moderate 5. RECURRENT SYMPTOM: "Have you had difficulty breathing before?" If Yes, ask: "When was the last time?" and "What happened that time?"      Yes 6. CARDIAC HISTORY: "Do you have any history of heart disease?" (e.g., heart attack, angina, bypass surgery, angioplasty)      *No Answer* 7. LUNG HISTORY: "Do you have any history of lung disease?"  (e.g., pulmonary embolus, asthma, emphysema)     COPD 8. CAUSE: "What do you think is causing the breathing problem?"      Bronchitis 9. OTHER SYMPTOMS: "Do you have any other symptoms? (e.g., dizziness, runny nose, cough, chest pain, fever)     No 10. O2 SATURATION MONITOR:  "Do you use an oxygen saturation monitor (pulse oximeter) at home?" If Yes, ask: "What is your reading (oxygen level) today?" "What is your usual oxygen saturation reading?" (e.g., 95%)       Not asked 11. PREGNANCY: "Is there any chance you are pregnant?" "When was your last menstrual period?"       N/AA 12. TRAVEL: "Have you traveled out of the country in the last month?" (e.g., travel history, exposures)       Yes exposure  Per chart review, pt was seen on 06/16/22 at [facility] for influenza A and COPD exacerbation. Was prescribed tamiflu, azithromycin, promethazine-dextromethorphan and prednisone with albuterol.  - predniSONE (DELTASONE) 20 MG tablet; Take 1 tablet (20 mg total) by mouth 2 (two) times daily for 5 days - oseltamivir (TAMIFLU) 75 MG  capsule; Take 1 capsule (75 mg total) by mouth 2 (two) times daily for 5 days - azithromycin (ZITHROMAX) 250 MG tablet; 2 tabs on day one, then 1 tab daily x 4 days - promethazine-dextromethorphan (PROMETHAZINE-DM) 6.25-15 mg/5 mL syrup; Take 5 mLs by mouth every 6 (six) hours as needed  Medications: Outpatient Medications Prior to Visit  Medication Sig  . albuterol (VENTOLIN HFA) 108 (90 Base) MCG/ACT inhaler Inhale 2 puffs into the lungs every 6 (six) hours as needed for wheezing or shortness of breath.  Marland Kitchen aspirin EC 81 MG tablet Take 1 tablet (81 mg total)  by mouth daily. Swallow whole.  . rosuvastatin (CRESTOR) 40 MG tablet Take 1 tablet (40 mg total) by mouth every other day.  Marland Kitchen Spacer/Aero-Holding Chambers (AEROCHAMBER MV) inhaler Use as instructed   No facility-administered medications prior to visit.    Review of Systems  All other systems reviewed and are negative. Except see HPI {Labs  Heme  Chem  Endocrine  Serology  Results Review (optional):23779}   Objective    BP 122/65 (BP Location: Right Arm, Patient Position: Sitting, Cuff Size: Normal)   Pulse 78   Wt 99 lb 9.6 oz (45.2 kg)   SpO2 95%   BMI 17.10 kg/m  {Show previous vital signs (optional):23777}  Physical Exam Vitals reviewed.  Constitutional:      General: She is not in acute distress.    Appearance: Normal appearance. She is well-developed. She is not diaphoretic.  HENT:     Head: Normocephalic and atraumatic.  Eyes:     General: No scleral icterus.    Conjunctiva/sclera: Conjunctivae normal.  Neck:     Thyroid: No thyromegaly.  Cardiovascular:     Rate and Rhythm: Normal rate and regular rhythm.     Pulses: Normal pulses.     Heart sounds: Normal heart sounds. No murmur heard. Pulmonary:     Effort: Pulmonary effort is normal. No respiratory distress.     Breath sounds: Normal breath sounds. No wheezing, rhonchi or rales.  Musculoskeletal:     Cervical back: Neck supple.     Right lower  leg: No edema.     Left lower leg: No edema.  Lymphadenopathy:     Cervical: No cervical adenopathy.  Skin:    General: Skin is warm and dry.     Findings: No rash.  Neurological:     Mental Status: She is alert and oriented to person, place, and time. Mental status is at baseline.  Psychiatric:        Behavior: Behavior normal.        Thought Content: Thought content normal.        Judgment: Judgment normal.      No results found for any visits on 07/11/22.  Assessment & Plan     *** The patient was advised to call back or seek an in-person evaluation if the symptoms worsen or if the condition fails to improve as anticipated.  I discussed the assessment and treatment plan with the patient. The patient was provided an opportunity to ask questions and all were answered. The patient agreed with the plan and demonstrated an understanding of the instructions.  The entirety of the information documented in the History of Present Illness, Review of Systems and Physical Exam were personally obtained by me. Portions of this information were initially documented by the CMA and reviewed by me for thoroughness and accuracy.   Mardene Speak, [facility], [place] [phone] (phone) [phone] (fax)

## 2022-07-11 NOTE — Telephone Encounter (Signed)
  Chief Complaint: Shortness of breath Symptoms: Had flu before Christmas also bronchitis, it's not going away and I have COPD.   Coughing up thick mucus.   Other URI symptoms resolved from having the flu. Frequency: Shortness of breath becoming worse over last 2-3 days evan at rest. Pertinent Negatives: Patient denies fever  Disposition: [] ED /[] Urgent Care (no appt availability in office) / [x] Appointment(In office/virtual)/ []  [place] [facility]/ [] Home Care/ [] Refused Recommended Disposition /[] [facility]/ []  Follow-up with PCP Additional Notes: Appt for today with Mardene Speak, PA-C for 3:40.

## 2022-07-11 NOTE — Telephone Encounter (Signed)
Reason for Disposition  [1] MILD difficulty breathing (e.g., minimal/no SOB at rest, SOB with walking, pulse <100) AND [2] NEW-onset or WORSE than normal  Answer Assessment - Initial Assessment Questions 1. RESPIRATORY STATUS: "Describe your breathing?" (e.g., wheezing, shortness of breath, unable to speak, severe coughing)      I went to a walk in clinic before Christmas.  I had the flu.     I'm short of breath.   I have COPD.    The bronchitis is lingering and I'm tired. 2. ONSET: "When did this breathing problem begin?"      Started before Christmas.    I was exposed to my sick grand child.    3. PATTERN "Does the difficult breathing come and go, or has it been constant since it started?"      Shortness of breath even at rest. 4. SEVERITY: "How bad is your breathing?" (e.g., mild, moderate, severe)    - MILD: No SOB at rest, mild SOB with walking, speaks normally in sentences, can lie down, no retractions, pulse < 100.    - MODERATE: SOB at rest, SOB with minimal exertion and prefers to sit, cannot lie down flat, speaks in phrases, mild retractions, audible wheezing, pulse 100-120.    - SEVERE: Very SOB at rest, speaks in single words, struggling to breathe, sitting hunched forward, retractions, pulse > 120      Moderate 5. RECURRENT SYMPTOM: "Have you had difficulty breathing before?" If Yes, ask: "When was the last time?" and "What happened that time?"      Yes 6. CARDIAC HISTORY: "Do you have any history of heart disease?" (e.g., heart attack, angina, bypass surgery, angioplasty)      *No Answer* 7. LUNG HISTORY: "Do you have any history of lung disease?"  (e.g., pulmonary embolus, asthma, emphysema)     COPD 8. CAUSE: "What do you think is causing the breathing problem?"      Bronchitis 9. OTHER SYMPTOMS: "Do you have any other symptoms? (e.g., dizziness, runny nose, cough, chest pain, fever)     No 10. O2 SATURATION MONITOR:  "Do you use an oxygen saturation monitor (pulse oximeter)  at home?" If Yes, ask: "What is your reading (oxygen level) today?" "What is your usual oxygen saturation reading?" (e.g., 95%)       Not asked 11. PREGNANCY: "Is there any chance you are pregnant?" "When was your last menstrual period?"       N/AA 12. TRAVEL: "Have you traveled out of the country in the last month?" (e.g., travel history, exposures)       Yes exposure  Protocols used: Breathing Difficulty-A-AH

## 2022-07-11 NOTE — Progress Notes (Incomplete)
I,Sha'taria Tyson,acting as a Education administrator for [employer], PA-C.,have documented all relevant documentation on the behalf of Mardene Speak, PA-C,as directed by  [employer], PA-C while in the presence of [employer], PA-C.    Established patient visit   Patient: Sierra Brown   DOB: 03/10/1951   72 y.o. Female  MRN: [id number] Visit Date: 07/11/2022  Today's healthcare provider: Mardene Speak, PA-C   Chief Complaint  Patient presents with  . Shortness of Breath   Subjective    HPI  Chief Complaint: Shortness of breath Symptoms: Had flu before Christmas also bronchitis, it's not going away and I have COPD.   Coughing up thick mucus.   Other URI symptoms resolved from having the flu. Frequency: Shortness of breath becoming worse over last 2-3 days evan at rest. Pertinent Negatives: Patient denies fever   Reason for Disposition . [1] MILD difficulty breathing (e.g., minimal/no SOB at rest, SOB with walking, pulse <100) AND [2] NEW-onset or WORSE than normal  Answer Assessment - Initial Assessment Questions 1. RESPIRATORY STATUS: "Describe your breathing?" (e.g., wheezing, shortness of breath, unable to speak, severe coughing)      I went to a walk in clinic before Christmas.  I had the flu.     I'm short of breath.   I have COPD.    The bronchitis is lingering and I'm tired. 2. ONSET: "When did this breathing problem begin?"      Started before Christmas.    I was exposed to my sick grand child.    3. PATTERN "Does the difficult breathing come and go, or has it been constant since it started?"      Shortness of breath even at rest. 4. SEVERITY: "How bad is your breathing?" (e.g., mild, moderate, severe)    - MILD: No SOB at rest, mild SOB with walking, speaks normally in sentences, can lie down, no retractions, pulse < 100.    - MODERATE: SOB at rest, SOB with minimal exertion and prefers to sit, cannot lie down flat, speaks in phrases, mild retractions, audible wheezing,  pulse 100-120.    - SEVERE: Very SOB at rest, speaks in single words, struggling to breathe, sitting hunched forward, retractions, pulse > 120      Moderate 5. RECURRENT SYMPTOM: "Have you had difficulty breathing before?" If Yes, ask: "When was the last time?" and "What happened that time?"      Yes 6. CARDIAC HISTORY: "Do you have any history of heart disease?" (e.g., heart attack, angina, bypass surgery, angioplasty)      *No Answer* 7. LUNG HISTORY: "Do you have any history of lung disease?"  (e.g., pulmonary embolus, asthma, emphysema)     COPD 8. CAUSE: "What do you think is causing the breathing problem?"      Bronchitis 9. OTHER SYMPTOMS: "Do you have any other symptoms? (e.g., dizziness, runny nose, cough, chest pain, fever)     No 10. O2 SATURATION MONITOR:  "Do you use an oxygen saturation monitor (pulse oximeter) at home?" If Yes, ask: "What is your reading (oxygen level) today?" "What is your usual oxygen saturation reading?" (e.g., 95%)       Not asked 11. PREGNANCY: "Is there any chance you are pregnant?" "When was your last menstrual period?"       N/AA 12. TRAVEL: "Have you traveled out of the country in the last month?" (e.g., travel history, exposures)       Yes exposure  Reason for Disposition . [  1] MILD difficulty breathing (e.g., minimal/no SOB at rest, SOB with walking, pulse <100) AND [2] NEW-onset or WORSE than normal  Per chart review, pt was seen on 06/16/22 at [facility] for influenza A and COPD exacerbation. Was prescribed tamiflu, azithromycin, promethazine-dextromethorphan and prednisone with albuterol.  Medications: Outpatient Medications Prior to Visit  Medication Sig  . albuterol (VENTOLIN HFA) 108 (90 Base) MCG/ACT inhaler Inhale 2 puffs into the lungs every 6 (six) hours as needed for wheezing or shortness of breath.  Marland Kitchen aspirin EC 81 MG tablet Take 1 tablet (81 mg total) by mouth daily. Swallow whole.  . rosuvastatin (CRESTOR) 40 MG tablet Take 1 tablet (40  mg total) by mouth every other day.  Marland Kitchen Spacer/Aero-Holding Chambers (AEROCHAMBER MV) inhaler Use as instructed   No facility-administered medications prior to visit.    Review of Systems  All other systems reviewed and are negative. Except see HPI {Labs  Heme  Chem  Endocrine  Serology  Results Review (optional):23779}   Objective    BP 122/65 (BP Location: Right Arm, Patient Position: Sitting, Cuff Size: Normal)   Pulse 78   Wt 99 lb 9.6 oz (45.2 kg)   SpO2 95%   BMI 17.10 kg/m  {Show previous vital signs (optional):23777}  Physical Exam Vitals reviewed.  Constitutional:      General: She is not in acute distress.    Appearance: Normal appearance. She is well-developed. She is not diaphoretic.  HENT:     Head: Normocephalic and atraumatic.  Eyes:     General: No scleral icterus.    Conjunctiva/sclera: Conjunctivae normal.  Neck:     Thyroid: No thyromegaly.  Cardiovascular:     Rate and Rhythm: Normal rate and regular rhythm.     Pulses: Normal pulses.     Heart sounds: Normal heart sounds. No murmur heard. Pulmonary:     Effort: Pulmonary effort is normal. No respiratory distress.     Breath sounds: Examination of the right-middle field reveals rales. Examination of the right-lower field reveals rales. Examination of the left-lower field reveals rales. Rales present. No wheezing or rhonchi.  Musculoskeletal:     Cervical back: Neck supple.     Right lower leg: No edema.     Left lower leg: No edema.  Lymphadenopathy:     Cervical: No cervical adenopathy.  Skin:    General: Skin is warm and dry.     Findings: No rash.  Neurological:     Mental Status: She is alert and oriented to person, place, and time. Mental status is at baseline.  Psychiatric:        Behavior: Behavior normal.        Thought Content: Thought content normal.        Judgment: Judgment normal.       No results found for any visits on 07/11/22.  Assessment & Plan     1. Shortness of  breath 2. Tobacco abuse - DG Chest 2 View; Future   The patient was advised to call back or seek an in-person evaluation if the symptoms worsen or if the condition fails to improve as anticipated.  I discussed the assessment and treatment plan with the patient. The patient was provided an opportunity to ask questions and all were answered. The patient agreed with the plan and demonstrated an understanding of the instructions.  The entirety of the information documented in the History of Present Illness, Review of Systems and Physical Exam were personally obtained by me. Portions  of this information were initially documented by the CMA and reviewed by me for thoroughness and accuracy.   Mardene Speak, [facility], [place] [phone] (phone) [phone] (fax)

## 2022-07-12 ENCOUNTER — Encounter: Payer: Self-pay | Admitting: Physician Assistant

## 2022-07-12 ENCOUNTER — Ambulatory Visit
Admission: RE | Admit: 2022-07-12 | Discharge: 2022-07-12 | Disposition: A | Discharge status: Home / Self Care | Payer: Medicare HMO | Attending: Physician Assistant | Admitting: Physician Assistant

## 2022-07-12 ENCOUNTER — Ambulatory Visit
Admission: RE | Admit: 2022-07-12 | Discharge: 2022-07-12 | Disposition: A | Discharge status: Home / Self Care | Payer: Medicare HMO | Source: Ambulatory Visit | Attending: Physician Assistant | Admitting: Physician Assistant

## 2022-07-12 DIAGNOSIS — R059 Cough, unspecified: Secondary | ICD-10-CM | POA: Insufficient documentation

## 2022-07-12 DIAGNOSIS — R636 Underweight: Secondary | ICD-10-CM | POA: Diagnosis not present

## 2022-07-12 DIAGNOSIS — R0602 Shortness of breath: Secondary | ICD-10-CM | POA: Diagnosis not present

## 2022-07-12 DIAGNOSIS — Z720 Tobacco use: Secondary | ICD-10-CM | POA: Insufficient documentation

## 2022-07-12 DIAGNOSIS — J439 Emphysema, unspecified: Secondary | ICD-10-CM | POA: Diagnosis not present

## 2022-07-12 DIAGNOSIS — Z8679 Personal history of other diseases of the circulatory system: Secondary | ICD-10-CM | POA: Diagnosis not present

## 2022-07-12 DIAGNOSIS — R0689 Other abnormalities of breathing: Secondary | ICD-10-CM | POA: Diagnosis not present

## 2022-07-12 DIAGNOSIS — R0609 Other forms of dyspnea: Secondary | ICD-10-CM | POA: Diagnosis not present

## 2022-07-12 DIAGNOSIS — R69 Illness, unspecified: Secondary | ICD-10-CM | POA: Diagnosis not present

## 2022-07-13 ENCOUNTER — Other Ambulatory Visit: Payer: Self-pay | Admitting: Physician Assistant

## 2022-07-13 DIAGNOSIS — J441 Chronic obstructive pulmonary disease with (acute) exacerbation: Secondary | ICD-10-CM

## 2022-07-13 LAB — CBC WITH DIFFERENTIAL/PLATELET
Basophils Absolute: 0 10*3/uL (ref 0.0–0.2)
Basos: 1 %
EOS (ABSOLUTE): 0 10*3/uL (ref 0.0–0.4)
Eos: 1 %
Hematocrit: 40 % (ref 34.0–46.6)
Hemoglobin: 14 g/dL (ref 11.1–15.9)
Immature Grans (Abs): 0 10*3/uL (ref 0.0–0.1)
Immature Granulocytes: 0 %
Lymphocytes Absolute: 0.9 10*3/uL (ref 0.7–3.1)
Lymphs: 24 %
MCH: 31.3 pg (ref 26.6–33.0)
MCHC: 35 g/dL (ref 31.5–35.7)
MCV: 90 fL (ref 79–97)
Monocytes Absolute: 0.5 10*3/uL (ref 0.1–0.9)
Monocytes: 13 %
Neutrophils Absolute: 2.5 10*3/uL (ref 1.4–7.0)
Neutrophils: 61 %
Platelets: 203 10*3/uL (ref 150–450)
RBC: 4.47 x10E6/uL (ref 3.77–5.28)
RDW: 12.1 % (ref 11.7–15.4)
WBC: 4 10*3/uL (ref 3.4–10.8)

## 2022-07-13 LAB — COMPREHENSIVE METABOLIC PANEL
ALT: 24 IU/L (ref 0–32)
AST: 37 IU/L (ref 0–40)
Albumin/Globulin Ratio: 2 (ref 1.2–2.2)
Albumin: 4.2 g/dL (ref 3.9–4.9)
Alkaline Phosphatase: 84 IU/L (ref 44–121)
BUN/Creatinine Ratio: 11 — ABNORMAL LOW (ref 12–28)
BUN: 9 mg/dL (ref 8–27)
Bilirubin Total: 0.6 mg/dL (ref 0.0–1.2)
CO2: 19 mmol/L — ABNORMAL LOW (ref 20–29)
Calcium: 9.2 mg/dL (ref 8.7–10.3)
Chloride: 103 mmol/L (ref 96–106)
Creatinine, Ser: 0.8 mg/dL (ref 0.57–1.00)
Globulin, Total: 2.1 g/dL (ref 1.5–4.5)
Glucose: 126 mg/dL — ABNORMAL HIGH (ref 70–99)
Potassium: 4.2 mmol/L (ref 3.5–5.2)
Sodium: 141 mmol/L (ref 134–144)
Total Protein: 6.3 g/dL (ref 6.0–8.5)
eGFR: 80 mL/min/{1.73_m2} (ref >59)

## 2022-07-13 LAB — PRO B NATRIURETIC PEPTIDE: NT-Pro BNP: 137 pg/mL (ref 0–301)

## 2022-07-13 LAB — TSH: TSH: 1.77 u[IU]/mL (ref 0.450–4.500)

## 2022-07-13 MED ORDER — AZITHROMYCIN 250 MG PO TABS: ORAL_TABLET | ORAL | 0 refills | Status: AC

## 2022-07-13 MED ORDER — PREDNISONE 20 MG PO TABS: 20.0000 mg | ORAL_TABLET | Freq: Every day | ORAL | 0 refills | Status: DC

## 2022-07-13 NOTE — Progress Notes (Signed)
Please, let pt know that all her labs are normal except slight elevation of blood sugar. Your imaging showed: emphysematous changes, atelectasis and chronic changes in the apices.  Please, continue using albuterol every 4-6 hours for cough, sob, wheezing Medications will be sent to your pharmacy. Please, set up a fu appt in a week. We might need do a FU imaging.

## 2022-07-22 ENCOUNTER — Emergency Department: Payer: Medicare HMO

## 2022-07-22 ENCOUNTER — Emergency Department
Admission: EM | Admit: 2022-07-22 | Discharge: 2022-07-22 | Disposition: A | Discharge status: Home / Self Care | Payer: Medicare HMO | Attending: Emergency Medicine | Admitting: Emergency Medicine

## 2022-07-22 ENCOUNTER — Other Ambulatory Visit: Payer: Self-pay

## 2022-07-22 DIAGNOSIS — J441 Chronic obstructive pulmonary disease with (acute) exacerbation: Secondary | ICD-10-CM | POA: Insufficient documentation

## 2022-07-22 DIAGNOSIS — Z7982 Long term (current) use of aspirin: Secondary | ICD-10-CM | POA: Insufficient documentation

## 2022-07-22 DIAGNOSIS — Z1152 Encounter for screening for COVID-19: Secondary | ICD-10-CM | POA: Diagnosis not present

## 2022-07-22 DIAGNOSIS — R0602 Shortness of breath: Secondary | ICD-10-CM | POA: Diagnosis not present

## 2022-07-22 DIAGNOSIS — R000 Tachycardia, unspecified: Secondary | ICD-10-CM | POA: Diagnosis not present

## 2022-07-22 DIAGNOSIS — J439 Emphysema, unspecified: Secondary | ICD-10-CM | POA: Diagnosis not present

## 2022-07-22 DIAGNOSIS — Z7951 Long term (current) use of inhaled steroids: Secondary | ICD-10-CM | POA: Diagnosis not present

## 2022-07-22 DIAGNOSIS — I700 Atherosclerosis of aorta: Secondary | ICD-10-CM | POA: Diagnosis not present

## 2022-07-22 LAB — CBC
HCT: 41.5 % (ref 36.0–46.0)
Hemoglobin: 14.5 g/dL (ref 12.0–15.0)
MCH: 31.3 pg (ref 26.0–34.0)
MCHC: 34.9 g/dL (ref 30.0–36.0)
MCV: 89.6 fL (ref 80.0–100.0)
Platelets: 406 10*3/uL — ABNORMAL HIGH (ref 150–400)
RBC: 4.63 MIL/uL (ref 3.87–5.11)
RDW: 12.5 % (ref 11.5–15.5)
WBC: 9.8 10*3/uL (ref 4.0–10.5)
nRBC: 0 % (ref 0.0–0.2)

## 2022-07-22 LAB — BASIC METABOLIC PANEL
Anion gap: 11 (ref 5–15)
BUN: 14 mg/dL (ref 8–23)
CO2: 25 mmol/L (ref 22–32)
Calcium: 9.5 mg/dL (ref 8.9–10.3)
Chloride: 102 mmol/L (ref 98–111)
Creatinine, Ser: 0.75 mg/dL (ref 0.44–1.00)
GFR, Estimated: >60 mL/min (ref >60)
Glucose, Bld: 96 mg/dL (ref 70–99)
Potassium: 4.1 mmol/L (ref 3.5–5.1)
Sodium: 138 mmol/L (ref 135–145)

## 2022-07-22 LAB — TROPONIN I (HIGH SENSITIVITY)
Troponin I (High Sensitivity): 6 ng/L (ref <18)
Troponin I (High Sensitivity): 6 ng/L (ref <18)

## 2022-07-22 LAB — RESP PANEL BY RT-PCR (RSV, FLU A&B, COVID)  RVPGX2
Influenza A by PCR: NEGATIVE
Influenza B by PCR: NEGATIVE
Resp Syncytial Virus by PCR: NEGATIVE
SARS Coronavirus 2 by RT PCR: NEGATIVE

## 2022-07-22 LAB — D-DIMER, QUANTITATIVE: D-Dimer, Quant: 0.45 ug/mL-FEU (ref 0.00–0.50)

## 2022-07-22 MED ORDER — IPRATROPIUM BROMIDE 0.02 % IN SOLN
0.5000 mg | Freq: Once | RESPIRATORY_TRACT | Status: AC
  Administered 2022-07-22: 0.5 mg via RESPIRATORY_TRACT
  Filled 2022-07-22: qty 2.5

## 2022-07-22 MED ORDER — ALBUTEROL SULFATE HFA 108 (90 BASE) MCG/ACT IN AERS: 2.0000 | INHALATION_SPRAY | RESPIRATORY_TRACT | 0 refills | Status: DC | PRN

## 2022-07-22 MED ORDER — METHYLPREDNISOLONE SODIUM SUCC 125 MG IJ SOLR
125.0000 mg | Freq: Once | INTRAMUSCULAR | Status: AC
  Administered 2022-07-22: 125 mg via INTRAVENOUS
  Filled 2022-07-22: qty 2

## 2022-07-22 MED ORDER — ALBUTEROL SULFATE (2.5 MG/3ML) 0.083% IN NEBU
5.0000 mg | INHALATION_SOLUTION | Freq: Once | RESPIRATORY_TRACT | Status: AC
  Administered 2022-07-22: 5 mg via RESPIRATORY_TRACT
  Filled 2022-07-22: qty 6

## 2022-07-22 MED ORDER — PREDNISONE 20 MG PO TABS: 60.0000 mg | ORAL_TABLET | Freq: Every day | ORAL | 0 refills | Status: DC

## 2022-07-22 NOTE — ED Provider Notes (Signed)
[facility] Provider Note    Event Date/Time   First MD Initiated Contact with Patient 07/22/22 0155     (approximate)   History   Shortness of Breath   HPI  Sierra Brown is a 72 y.o. female with history of COPD not on oxygen who presents to the emergency department shortness of breath ongoing for several weeks.  States in December she had influenza.  She was given Tamiflu, antibiotics and steroids.  States her symptoms improved but have come back over the past couple of weeks and she been back on azithromycin and prednisone without relief.  No fevers.  Nonproductive cough.  No chest pain.  No history of PE or DVT.   History provided by patient and husband.    Past Medical History:  Diagnosis Date   Allergy    allerlgic rhinitis   Cataract    COPD (chronic obstructive pulmonary disease) ([place])    Pneumonia    Tobacco use disorder     Past Surgical History:  Procedure Laterality Date   CESAREAN SECTION     TONSILLECTOMY      MEDICATIONS:  Prior to Admission medications   Medication Sig Start Date End Date Taking? Authorizing Provider  albuterol (VENTOLIN HFA) 108 (90 Base) MCG/ACT inhaler Inhale 2 puffs into the lungs every 6 (six) hours as needed for wheezing or shortness of breath. 06/03/22   Gwyneth Sprout, FNP  aspirin EC 81 MG tablet Take 1 tablet (81 mg total) by mouth daily. Swallow whole. 01/25/22   Gwyneth Sprout, FNP  predniSONE (DELTASONE) 20 MG tablet Take 1 tablet (20 mg total) by mouth daily with breakfast. 07/13/22   Ostwalt, Letitia Libra, PA-C  rosuvastatin (CRESTOR) 40 MG tablet Take 1 tablet (40 mg total) by mouth every other day. 06/03/22   Gwyneth Sprout, FNP  Spacer/Aero-Holding Chambers (AEROCHAMBER MV) inhaler Use as instructed 06/15/21   Gwyneth Sprout, FNP    Physical Exam   Triage Vital Signs: ED Triage Vitals  Enc Vitals Group     BP 07/22/22 0119 (!) 150/82     Pulse Rate 07/22/22 0119 (!) 102     Resp 07/22/22 0119  (!) 21     Temp 07/22/22 0119 98.2 F (36.8 C)     Temp Source 07/22/22 0119 Axillary     SpO2 07/22/22 0119 99 %     Weight --      Height --      Head Circumference --      Peak Flow --      Pain Score 07/22/22 0136 0     Pain Loc --      Pain Edu? --      Excl. in [place]? --     Most recent vital signs: Vitals:   07/22/22 0119  BP: (!) 150/82  Pulse: (!) 102  Resp: (!) 21  Temp: 98.2 F (36.8 C)  SpO2: 99%    CONSTITUTIONAL: Alert, responds appropriately to questions. Well-appearing; well-nourished HEAD: Normocephalic, atraumatic EYES: Conjunctivae clear, pupils appear equal, sclera nonicteric ENT: normal nose; moist mucous membranes NECK: Supple, normal ROM CARD: RRR; S1 and S2 appreciated RESP: Pt is tachypneic and speaking in short sentences.  No hypoxia.  She appears to be anxious but not in respiratory distress.  Her aeration is diminished at her bases but no rhonchi, rales or wheezing. ABD/GI: Non-distended; soft, non-tender, no rebound, no guarding, no peritoneal signs BACK: The back appears normal EXT: Normal ROM  in all joints; no deformity noted, no edema, no calf tenderness or calf swelling SKIN: Normal color for age and race; warm; no rash on exposed skin NEURO: Moves all extremities equally, normal speech PSYCH: The patient's mood and manner are appropriate.   ED Results / Procedures / Treatments   LABS: (all labs ordered are listed, but only abnormal results are displayed) Labs Reviewed  CBC - Abnormal; Notable for the following components:      Result Value   Platelets 406 (*)    All other components within normal limits  RESP PANEL BY RT-PCR (RSV, FLU A&B, COVID)  [id number]  BASIC METABOLIC PANEL  D-DIMER, QUANTITATIVE  TROPONIN I (HIGH SENSITIVITY)  TROPONIN I (HIGH SENSITIVITY)     EKG:  EKG Interpretation  Date/Time:  Friday July 22 2022 01:23:23 EST Ventricular Rate:  89 PR Interval:  116 QRS Duration: 62 QT Interval:  340 QTC  Calculation: 413 R Axis:   94 Text Interpretation: Normal sinus rhythm with sinus arrhythmia Rightward axis Septal infarct (cited on or before 13-Jan-2022) Abnormal ECG When compared with ECG of 13-Jan-2022 21:35, No significant change was found Confirmed by Rochele Raring [phone]) on 07/22/2022 4:07:04 AM         RADIOLOGY: My personal review and interpretation of imaging: X-ray clear.  I have personally reviewed all radiology reports.   DG Chest 2 View  Result Date: 07/22/2022 CLINICAL DATA:  Shortness of breath. EXAM: CHEST - 2 VIEW COMPARISON:  Chest radiograph dated 07/12/2022. FINDINGS: Bibasilar scarring. There is background of emphysema and chronic interstitial coarsening. No focal consolidation, pleural effusion, or pneumothorax. The cardiac silhouette is within normal limits. Atherosclerotic calcification of the aortic arch. Osteopenia. No acute osseous pathology. IMPRESSION: 1. No active cardiopulmonary disease. 2. Emphysema. Electronically Signed   By: Elgie Collard M.D.   On: 07/22/2022 02:25     PROCEDURES:  Critical Care performed: No      .1-3 Lead EKG Interpretation  Performed by: Shadrick Senne, Layla Maw, DO Authorized by: Demya Scruggs, Layla Maw, DO     Interpretation: abnormal     ECG rate:  102   ECG rate assessment: tachycardic     Rhythm: sinus tachycardia     Ectopy: none     Conduction: normal       IMPRESSION / MDM / ASSESSMENT AND PLAN / ED COURSE  I reviewed the triage vital signs and the nursing notes.    Patient here with ongoing shortness of breath for the past 2 months.  The patient is on the cardiac monitor to evaluate for evidence of arrhythmia and/or significant heart rate changes.   DIFFERENTIAL DIAGNOSIS (includes but not limited to):   COPD exacerbation, pneumonia, PE, pneumothorax, CHF, ACS, viral URI   Patient's presentation is most consistent with acute presentation with potential threat to life or bodily function.   PLAN: Will obtain  CBC, BMP, troponin x 2, D-dimer, chest x-ray, COVID and flu swabs.  Will give albuterol, Atrovent, Solu-Medrol and reassess.  No hypoxia at rest.   MEDICATIONS GIVEN IN ED: Medications  albuterol (PROVENTIL) (2.5 MG/3ML) 0.083% nebulizer solution 5 mg (has no administration in time range)  ipratropium (ATROVENT) nebulizer solution 0.5 mg (has no administration in time range)  albuterol (PROVENTIL) (2.5 MG/3ML) 0.083% nebulizer solution 5 mg (5 mg Nebulization Given 07/22/22 0429)  ipratropium (ATROVENT) nebulizer solution 0.5 mg (0.5 mg Nebulization Given 07/22/22 0428)  methylPREDNISolone sodium succinate (SOLU-MEDROL) 125 mg/2 mL injection 125 mg (125 mg Intravenous Given 07/22/22 0445)  ED COURSE: Patient's labs unremarkable.  Troponin x 2 negative.  D-dimer negative.  COVID and flu negative.  Chest x-ray reviewed and interpreted by myself and the radiologist and shows no acute abnormality.  No hypoxia with ambulation or rest.  Offered admission for COPD exacerbation but patient reports feeling much better and is comfortable with plan for discharge home.  She does not have a nebulizer machine for home.  Recommended she talk to her doctor about this as I feel like this would significantly help her.  Will also give pulmonology follow-up information.  She has been on prednisone 20 mg daily for the past 8 days.  Have asked her to hold any further doses of this and will increase it to 60 mg a day for 5 days.  She states she has plenty of albuterol inhaler for home.  No indication for antibiotics.  I feel she is safe for discharge.   At this time, I do not feel there is any life-threatening condition present. I reviewed all nursing notes, vitals, pertinent previous records.  All lab and urine results, EKGs, imaging ordered have been independently reviewed and interpreted by myself.  I reviewed all available radiology reports from any imaging ordered this visit.  Based on my assessment, I feel the  patient is safe to be discharged home without further emergent workup and can continue workup as an outpatient as needed. Discussed all findings, treatment plan as well as usual and customary return precautions.  They verbalize understanding and are comfortable with this plan.  Outpatient follow-up has been provided as needed.  All questions have been answered.   CONSULTS: Admission offered but patient is feeling better and comfortable plan for discharge home.   OUTSIDE RECORDS REVIEWED: Reviewed last internal medicine note with [facility] Fus on 06/16/2022 where patient was diagnosed with influenza A.  Patient was put on azithromycin, Tamiflu and prednisone at that time.       FINAL CLINICAL IMPRESSION(S) / ED DIAGNOSES   Final diagnoses:  COPD exacerbation ([place])     Rx / DC Orders   ED Discharge Orders          Ordered    predniSONE (DELTASONE) 20 MG tablet  Daily        07/22/22 0618    albuterol (VENTOLIN HFA) 108 (90 Base) MCG/ACT inhaler  Every 4 hours PRN        07/22/22 5462             Note:  This document was prepared using Dragon voice recognition software and may include unintentional dictation errors.   Emil Weigold, Delice Bison, DO 07/22/22 [phone]

## 2022-07-22 NOTE — ED Notes (Signed)
Pt transported to XR.  

## 2022-07-22 NOTE — Discharge Instructions (Addendum)
Please stop your prednisone 20 mg.  Please begin taking prednisone 60 mg daily for the next 5 days starting on 07/23/2022.  Recommend close follow-up with your primary care doctor to discuss getting a nebulizer machine for home.  I recommend close follow-up with pulmonology as well.  Please call to schedule an appointment.

## 2022-07-22 NOTE — ED Triage Notes (Signed)
Pt comes from home via POV c/o SOB. Pt states before christmas was seen for flu, bronchitis and sinus infection, was prescribed medication and felt better. 2 weeks ago started to not feel good, went to primary care and doctor said that she didn't think bronchitis had cleared up so gave her more antibiotics and prednisone. SOB has gotten worse in the last 3-4 days. Pt tachypneic and having increased work of breathing at this time. Pt denies CP at this time.

## 2022-07-23 ENCOUNTER — Emergency Department: Payer: Medicare HMO

## 2022-07-23 ENCOUNTER — Other Ambulatory Visit: Payer: Self-pay

## 2022-07-23 ENCOUNTER — Emergency Department
Admission: EM | Admit: 2022-07-23 | Discharge: 2022-07-23 | Disposition: A | Discharge status: Home / Self Care | Payer: Medicare HMO | Attending: Emergency Medicine | Admitting: Emergency Medicine

## 2022-07-23 DIAGNOSIS — Z87891 Personal history of nicotine dependence: Secondary | ICD-10-CM | POA: Diagnosis not present

## 2022-07-23 DIAGNOSIS — R0600 Dyspnea, unspecified: Secondary | ICD-10-CM | POA: Diagnosis not present

## 2022-07-23 DIAGNOSIS — R0602 Shortness of breath: Secondary | ICD-10-CM | POA: Diagnosis not present

## 2022-07-23 DIAGNOSIS — J449 Chronic obstructive pulmonary disease, unspecified: Secondary | ICD-10-CM | POA: Insufficient documentation

## 2022-07-23 DIAGNOSIS — B370 Candidal stomatitis: Secondary | ICD-10-CM | POA: Diagnosis not present

## 2022-07-23 LAB — CBC
HCT: 39.1 % (ref 36.0–46.0)
Hemoglobin: 13.6 g/dL (ref 12.0–15.0)
MCH: 31.9 pg (ref 26.0–34.0)
MCHC: 34.8 g/dL (ref 30.0–36.0)
MCV: 91.8 fL (ref 80.0–100.0)
Platelets: 383 10*3/uL (ref 150–400)
RBC: 4.26 MIL/uL (ref 3.87–5.11)
RDW: 12.8 % (ref 11.5–15.5)
WBC: 12.6 10*3/uL — ABNORMAL HIGH (ref 4.0–10.5)
nRBC: 0 % (ref 0.0–0.2)

## 2022-07-23 LAB — BASIC METABOLIC PANEL
Anion gap: 11 (ref 5–15)
BUN: 22 mg/dL (ref 8–23)
CO2: 23 mmol/L (ref 22–32)
Calcium: 9.8 mg/dL (ref 8.9–10.3)
Chloride: 102 mmol/L (ref 98–111)
Creatinine, Ser: 0.96 mg/dL (ref 0.44–1.00)
GFR, Estimated: >60 mL/min (ref >60)
Glucose, Bld: 134 mg/dL — ABNORMAL HIGH (ref 70–99)
Potassium: 4.4 mmol/L (ref 3.5–5.1)
Sodium: 136 mmol/L (ref 135–145)

## 2022-07-23 MED ORDER — FINGERTIP PULSE OXIMETER MISC: 1.0000 | Freq: Every day | 0 refills | Status: AC

## 2022-07-23 MED ORDER — NYSTATIN 100000 UNIT/ML MT SUSP: 5.0000 mL | Freq: Four times a day (QID) | OROMUCOSAL | 0 refills | Status: DC

## 2022-07-23 MED ORDER — IPRATROPIUM-ALBUTEROL 0.5-2.5 (3) MG/3ML IN SOLN
3.0000 mL | Freq: Once | RESPIRATORY_TRACT | Status: AC
  Administered 2022-07-23: 3 mL via RESPIRATORY_TRACT
  Filled 2022-07-23: qty 3

## 2022-07-23 NOTE — ED Provider Notes (Addendum)
[facility] Provider Note    Event Date/Time   First MD Initiated Contact with Patient 07/23/22 1648     (approximate)   History   Shortness of Breath (COPD exacerbation)   HPI  Sierra Brown is a 72 y.o. female past medical history of COPD who presents with shortness of breath.  Patient tells me that in December she had the flu was treated for bronchitis and was given Tamiflu since that time she has had issues with dyspnea.  She was seen in the emergency department 2 nights ago and had reassuring chest x-ray and labs and was discharged.  She had previously been on 20 mg of prednisone and the ED provider increase this to 60 for 5 days.  Patient has been using albuterol at home she is not on any at other controller medications.  Tells me she has never seen a lung doctor has never had pulmonary function testing done.  Has had long history of smoking.  Patient notes that her breathing seemed to get worse today.     Past Medical History:  Diagnosis Date   Allergy    allerlgic rhinitis   Cataract    COPD (chronic obstructive pulmonary disease) ([place])    Pneumonia    Tobacco use disorder     Patient Active Problem List   Diagnosis Date Noted   Shortness of breath 06/03/2022   Impacted cerumen of both ears 04/13/2022   History of TIA (transient ischemic attack) 01/25/2022   Primary hypertension 12/01/2021   Tobacco dependence due to cigarettes 12/01/2021   Age-related osteoporosis without current pathological fracture 12/01/2021   Mildly underweight adult 12/01/2021   Encounter for screening for lung cancer 08/25/2021   Encounter for screening for osteoporosis 08/25/2021   Colon cancer screening 08/25/2021   Screening mammogram for breast cancer 08/25/2021   Low weight 08/25/2021   Encounter for lipid screening for cardiovascular disease 08/25/2021   Need for vaccination against Streptococcus pneumoniae 08/25/2021   Annual physical exam 08/25/2021    History of hypertension 08/25/2021   Aortic atherosclerosis ([place]) 08/25/2021   History of palpitations in adulthood 08/05/2020   Tobacco abuse 05/25/2007   Seasonal allergic rhinitis 10/24/2006     Physical Exam  Triage Vital Signs: ED Triage Vitals  Enc Vitals Group     BP 07/23/22 1548 (!) 155/79     Pulse Rate 07/23/22 1548 80     Resp 07/23/22 1548 20     Temp 07/23/22 1548 98 F (36.7 C)     Temp Source 07/23/22 1548 Oral     SpO2 07/23/22 1548 99 %     Weight 07/23/22 1549 99 lb 6.8 oz (45.1 kg)     Height 07/23/22 1549 5' 4.5" (1.638 m)     Head Circumference --      Peak Flow --      Pain Score 07/23/22 1549 0     Pain Loc --      Pain Edu? --      Excl. in [place]? --     Most recent vital signs: Vitals:   07/23/22 1548 07/23/22 1700  BP: (!) 155/79   Pulse: 80   Resp: 20   Temp: 98 F (36.7 C)   SpO2: 99% 98%     General: Awake, no distress.  CV:  Good peripheral perfusion.  Resp:  Normal effort.  Lungs are clear no wheezing good air movement Abd:  No distention.  Neuro:  Awake, Alert, Oriented x 3  Other:  No peripheral edema Tongue with erythema and white plaques nothing on the buccal mucosa or posterior oropharynx  ED Results / Procedures / Treatments  Labs (all labs ordered are listed, but only abnormal results are displayed) Labs Reviewed  BASIC METABOLIC PANEL - Abnormal; Notable for the following components:      Result Value   Glucose, Bld 134 (*)    All other components within normal limits  CBC - Abnormal; Notable for the following components:   WBC 12.6 (*)    All other components within normal limits     EKG  EKG reviewed interpreted myself shows right axis deviation normal sinus rhythm normal axis no acute ischemic changes   RADIOLOGY Chest x-ray reviewed interpreted myself shows hyperinflated lungs without other acute process   PROCEDURES:  Critical Care performed: No  Procedures   MEDICATIONS ORDERED IN  ED: Medications  ipratropium-albuterol (DUONEB) 0.5-2.5 (3) MG/3ML nebulizer solution 3 mL (3 mLs Nebulization Given 07/23/22 1717)     IMPRESSION / MDM / ASSESSMENT AND PLAN / ED COURSE  I reviewed the triage vital signs and the nursing notes.                              Patient's presentation is most consistent with acute complicated illness / injury requiring diagnostic workup.  Differential diagnosis includes, but is not limited to, COPD exacerbation, pneumonia, pneumothorax, pulmonary embolism, pulmonary hypertension, CHF, anxiety  The patient is a 72 year old female with presumed COPD but no formal diagnosis of this who presents with shortness of breath.  Tells me she has been short of breath since she had the flu in December.  Was seen in the ED 2 days ago and steroids were increased.  She continues to feel dyspneic at home.  She is not having chest pain cough is productive but unchanged no lower extremity swelling.  Does feel anxious and panicky when she feels short of breath.  On my assessment she is resting in bed comfortably has no increased work of breathing lungs are clear with good air movement and no wheezing.  She is satting 100% on room air.  Tells me she feels short of breath at the time my evaluation.  She has no peripheral edema does not appear volume overloaded is quite thin.  Reviewed her chest x-ray which is hyperinflated consistent with likely emphysema but there is no pneumothorax or infiltrate.  EKG unchanged from prior.  CBC showing leukocytosis likely in the setting of using steroids BMP reassuring.  Reviewed labs from 2 days ago which include negative dimer and troponins x 2 which are negative.  Did give patient DuoNeb but my suspicion that an acute COPD exacerbation is causing her symptoms is low given she is not hypoxic she has no increased work of breathing and she is not wheezing.  Do feel that she would benefit from seeing pulmonology as she does not carry formal  diagnosis COPD and she likely needs to be started on controller medications.  Do not feel need to change her steroids or add on any additional antibiotics given no infiltrate on chest x-ray and cough changed.  Feel that risk of PE is quite low given negative D-dimer she is not having chest pain.  With 2 negative troponins and no chest pain feel that ACS is less likely to be the cause.  She does not look fluid overloaded and her  exam and chest x-ray do not suggest CHF.  Patient does appear to have oral thrush.  She is not using inhaled steroids unclear what the cause of this is.  Will prescribe nystatin swish and swallow.  Ultimately given patient's O2 sat and reassuring workup I think that she is appropriate to follow-up as an outpatient.    Will have her follow-up with pulmonology.       FINAL CLINICAL IMPRESSION(S) / ED DIAGNOSES   Final diagnoses:  Dyspnea, unspecified type  Oral thrush     Rx / DC Orders   ED Discharge Orders          Ordered    nystatin (MYCOSTATIN) 100000 UNIT/ML suspension  4 times daily        07/23/22 1716    Misc. Devices (FINGERTIP PULSE OXIMETER) MISC  Daily        07/23/22 1717             Note:  This document was prepared using Dragon voice recognition software and may include unintentional dictation errors.   Georga Hacking, MD 07/23/22 1755    Georga Hacking, MD 07/23/22 [phone]

## 2022-07-23 NOTE — ED Notes (Addendum)
See triage note. This all started approximately around christmas time. Pt came in Friday at 0100 for SOB. Pt does smoke but has continued feeling SOB and exhausted. Pt states does have COPD but has not officially diagnosed. Has been using albuterol. A&Ox4. Pt is currently taking steroids. States is currently feeling SOB.

## 2022-07-23 NOTE — ED Triage Notes (Signed)
Pt to Ed from home for SOB. Pt wsas just seen here for same late Thursday night early Friday morning. She is supposed to follow up with PCP and lung doctor  but unable to do so due to the weekend. Pt advised she doesn't feel any better. Pt is CAOx4 and in no acute distress and ambulatory in triage.

## 2022-07-25 ENCOUNTER — Ambulatory Visit: Payer: Self-pay

## 2022-07-25 ENCOUNTER — Telehealth: Payer: Self-pay | Admitting: Family Medicine

## 2022-07-25 NOTE — Telephone Encounter (Signed)
Referral Request - Has patient seen PCP for this complaint? yes *If NO, is insurance requiring patient see PCP for this issue before PCP can refer them? Referral for which specialty: pulmonologist Preferred provider/office: [phone] she thinks its [facility] pulmonologist Reason for referral: for her lungs/breathing issues at times

## 2022-07-25 NOTE — Telephone Encounter (Signed)
Chief Complaint: Mild difficulty breathing, needing nebulizer Symptoms: No other symptoms Frequency: Onset off and on since before Christmas Pertinent Negatives: Patient denies other symptoms. Disposition: [] ED /[] Urgent Care (no appt availability in office) / [x] Appointment(In office/virtual)/ []  [facility]/ [] Home Care/ [] Refused Recommended Disposition /[] [facility]/ []  Follow-up with PCP Additional Notes: Already scheduled HFU visit tomorrow.    Summary: request nebulizer   Patient called in asking for nebulizer to be sent in just in case she may need it to help with breathing as told by the ER  when she went.     Reason for Disposition  [1] MILD longstanding difficulty breathing AND [2]  SAME as normal  Answer Assessment - Initial Assessment Questions 1. RESPIRATORY STATUS: "Describe your breathing?" (e.g., wheezing, shortness of breath, unable to speak, severe coughing)      SOB 2. ONSET: "When did this breathing problem begin?"      Right before Christmas since having the Flu, ongoing 3. PATTERN "Does the difficult breathing come and go, or has it been constant since it started?"      Come and go 4. SEVERITY: "How bad is your breathing?" (e.g., mild, moderate, severe)    - MILD: No SOB at rest, mild SOB with walking, speaks normally in sentences, can lie down, no retractions, pulse < 100.    - MODERATE: SOB at rest, SOB with minimal exertion and prefers to sit, cannot lie down flat, speaks in phrases, mild retractions, audible wheezing, pulse 100-120.    - SEVERE: Very SOB at rest, speaks in single words, struggling to breathe, sitting hunched forward, retractions, pulse > 120      Mild and lay down flat hard to breathe, but can sleep during the night 5. RECURRENT SYMPTOM: "Have you had difficulty breathing before?" If Yes, ask: "When was the last time?" and "What happened that time?"      Last time was right before Christmas since having the flu, then  developed bronchitis, sinus infections  6. LUNG HISTORY: "Do you have any history of lung disease?"  (e.g., pulmonary embolus, asthma, emphysema)     COPD 7. OTHER SYMPTOMS: "Do you have any other symptoms? (e.g., dizziness, runny nose, cough, chest pain, fever)     No 8. O2 SATURATION MONITOR:  "Do you use an oxygen saturation monitor (pulse oximeter) at home?" If Yes, ask: "What is your reading (oxygen level) today?" "What is your usual oxygen saturation reading?" (e.g., 95%)       95%  Protocols used: Breathing Difficulty-A-AH

## 2022-07-25 NOTE — Progress Notes (Unsigned)
I,Chaylee Ehrsam R Aeon Kessner,acting as a Education administrator for Gwyneth Sprout, FNP.,have documented all relevant documentation on the behalf of Gwyneth Sprout, FNP,as directed by  Gwyneth Sprout, FNP while in the presence of Gwyneth Sprout, FNP.  Established patient visit  Patient: Sierra Brown   DOB: 03/24/1951   72 y.o. Female  MRN: [id number] Visit Date: 07/26/2022  Today's healthcare provider: Gwyneth Sprout, FNP  Introduced to nurse practitioner role and practice setting.  All questions answered.  Discussed provider/patient relationship and expectations.  Chief Complaint  Patient presents with   Follow-up   Subjective    HPI  Follow up Hospitalization  Patient [facility] ED [facility]22 and discharged on 07/23/22. She was treated for SOB/ COPD. Treatment for this included nebulizer treatment and referral to pulmonology Telephone follow up was done on N/A She reports excellent compliance with treatment. She reports this condition is improved. Patient quit smoking 5 days ago.  ----------------------------------------------------------------------------------------- -   Medications: Outpatient Medications Prior to Visit  Medication Sig   albuterol (VENTOLIN HFA) 108 (90 Base) MCG/ACT inhaler Inhale 2 puffs into the lungs every 6 (six) hours as needed for wheezing or shortness of breath.   albuterol (VENTOLIN HFA) 108 (90 Base) MCG/ACT inhaler Inhale 2-4 puffs into the lungs every 4 (four) hours as needed for wheezing or shortness of breath.   aspirin EC 81 MG tablet Take 1 tablet (81 mg total) by mouth daily. Swallow whole.   Misc. Devices (FINGERTIP PULSE OXIMETER) MISC 1 Device by Does not apply route daily.   nystatin (MYCOSTATIN) 100000 UNIT/ML suspension Take 5 mLs (500,000 Units total) by mouth 4 (four) times daily.   predniSONE (DELTASONE) 20 MG tablet Take 3 tablets (60 mg total) by mouth daily.   rosuvastatin (CRESTOR) 40 MG tablet Take 1 tablet (40 mg total) by mouth every  other day.   Spacer/Aero-Holding Chambers (AEROCHAMBER MV) inhaler Use as instructed   No facility-administered medications prior to visit.    Review of Systems     Objective    BP 116/60 (BP Location: Right Arm, Patient Position: Sitting, Cuff Size: Normal)   Pulse 84   Temp 98.1 F (36.7 C) (Oral)   Wt 98 lb 1.6 oz (44.5 kg)   SpO2 99%   BMI 16.58 kg/m    Physical Exam Vitals and nursing note reviewed.  Constitutional:      General: She is not in acute distress.    Appearance: Normal appearance. She is underweight. She is not ill-appearing, toxic-appearing or diaphoretic.  HENT:     Head: Normocephalic and atraumatic.  Cardiovascular:     Rate and Rhythm: Normal rate and regular rhythm.     Pulses: Normal pulses.     Heart sounds: Normal heart sounds. No murmur heard.    No friction rub. No gallop.  Pulmonary:     Effort: Pulmonary effort is normal. No respiratory distress.     Breath sounds: Normal breath sounds. No stridor. No wheezing, rhonchi or rales.  Chest:     Chest wall: No tenderness.  Abdominal:     General: Bowel sounds are normal.     Palpations: Abdomen is soft.  Musculoskeletal:        General: No swelling, tenderness, deformity or signs of injury. Normal range of motion.     Right lower leg: No edema.     Left lower leg: No edema.  Skin:    General: Skin is warm  and dry.     Capillary Refill: Capillary refill takes less than 2 seconds.     Coloration: Skin is not jaundiced or pale.     Findings: No bruising, erythema, lesion or rash.  Neurological:     General: No focal deficit present.     Mental Status: She is alert and oriented to person, place, and time. Mental status is at baseline.     Cranial Nerves: No cranial nerve deficit.     Sensory: No sensory deficit.     Motor: No weakness.     Coordination: Coordination normal.  Psychiatric:        Mood and Affect: Mood normal.        Behavior: Behavior normal.        Thought Content:  Thought content normal.        Judgment: Judgment normal.     No results found for any visits on 07/26/22.  Assessment & Plan     Problem List Items Addressed This Visit       Respiratory   Chronic obstructive pulmonary disease with acute exacerbation ([employer]) - Primary    Recent UC visits following breathing difficulty iso recent flu. Request for nebulizer to assist Request for duo nebs to use with nebulizer Has successfully quit smoking x 5 days; congratulated Plans to see pulmonary specialist       Relevant Medications   ipratropium-albuterol (DUONEB) 0.5-2.5 (3) MG/3ML SOLN   Other Relevant Orders   Ambulatory referral to Pulmonology   Return if symptoms worsen or fail to improve.     Vonna Kotyk, FNP, have reviewed all documentation for this visit. The documentation on 07/26/22 for the exam, diagnosis, procedures, and orders are all accurate and complete.  Gwyneth Sprout, [place] [phone] (phone) [phone] (fax)  [place]

## 2022-07-26 ENCOUNTER — Encounter: Payer: Self-pay | Admitting: Family Medicine

## 2022-07-26 ENCOUNTER — Ambulatory Visit (INDEPENDENT_AMBULATORY_CARE_PROVIDER_SITE_OTHER): Payer: Medicare HMO | Admitting: Family Medicine

## 2022-07-26 VITALS — BP 116/60 | HR 84 | Temp 98.1°F | Wt 98.1 lb

## 2022-07-26 DIAGNOSIS — J441 Chronic obstructive pulmonary disease with (acute) exacerbation: Secondary | ICD-10-CM | POA: Diagnosis not present

## 2022-07-26 DIAGNOSIS — J209 Acute bronchitis, unspecified: Secondary | ICD-10-CM | POA: Insufficient documentation

## 2022-07-26 MED ORDER — IPRATROPIUM-ALBUTEROL 0.5-2.5 (3) MG/3ML IN SOLN: 3.0000 mL | Freq: Four times a day (QID) | RESPIRATORY_TRACT | 11 refills | Status: DC | PRN

## 2022-07-26 NOTE — Assessment & Plan Note (Signed)
Recent UC visits following breathing difficulty iso recent flu. Request for nebulizer to assist Request for duo nebs to use with nebulizer Has successfully quit smoking x 5 days; congratulated Plans to see pulmonary specialist

## 2022-07-27 ENCOUNTER — Telehealth: Payer: Self-pay

## 2022-07-27 NOTE — Patient Outreach (Signed)
  Care Coordination TOC Note Transition Care Management Follow-up Telephone Call Date of discharge and from where: 07/23/22-[facility] ED  Dx:"Dyspnea" How have you been since you were released from the hospital? Patient pleased to report that she is "feeling better and SOB is improved." She denies any SOB at present. She states she feels "slightly jittery" and blames it on "Prednisone and breathing txs." She has completed steroid therapy today and has been advised by MD that sxs should improve ina  few days. Patient pleased to report that she has gone "six days without a cigarette and doing it cold [place]." Praised patient for her efforts and success. She voices that she saw PCP yesterday and has been referred to lung specialist which she will see next week. She also states that oral thrush has improved and almost cleared up.  Any questions or concerns? No  Items Reviewed: Did the pt receive and understand the discharge instructions provided? Yes  Medications obtained and verified?  Patient currently not at home with meds-reviewed meds during PCP appt yesterday Other? Yes  Any new allergies since your discharge? No  Dietary orders reviewed? Yes Do you have support at home? Yes -spouse  Home Care and Equipment/Supplies: Were home health services ordered? not applicable If so, what is the name of the agency? N/A  Has the agency set up a time to come to the patient's home? not applicable Were any new equipment or medical supplies ordered?  No What is the name of the medical supply agency? N/A Were you able to get the supplies/equipment? not applicable Do you have any questions related to the use of the equipment or supplies? No  Functional Questionnaire: (I = Independent and D = Dependent) ADLs: I  Bathing/Dressing- I  Meal Prep- I  Eating- I  Maintaining continence- I  Transferring/Ambulation- I  Managing Meds- I  Follow up appointments reviewed:  PCP Hospital f/u appt confirmed?   Patient completed PCP appt on yesterday(07/26/22)   [facility] f/u appt confirmed? Yes  Scheduled to see Dr. Melvyn Novas on 08/05/22 @ 1:30pm. Are transportation arrangements needed? No  If their condition worsens, is the pt aware to call PCP or go to the Emergency Dept.? Yes Was the patient provided with contact information for the PCP's office or ED? Yes Was to pt encouraged to call back with questions or concerns? Yes  SDOH assessments and interventions completed:   Yes SDOH Interventions Today    Flowsheet Row Most Recent Value  SDOH Interventions   Food Insecurity Interventions Intervention Not Indicated  Transportation Interventions Intervention Not Indicated       Care Coordination Interventions:  Education provided on resp mgmt    Encounter Outcome:  Pt. Visit Completed    Enzo Montgomery, RN,BSN,CCM [facility] Telephonic Care Management Coordinator Direct Phone: [phone] Toll Free: [phone] Fax: [phone]

## 2022-07-28 NOTE — Telephone Encounter (Signed)
Viewed chart Referral placed 07/26/22.  KP

## 2022-08-02 ENCOUNTER — Encounter: Payer: Self-pay | Admitting: Emergency Medicine

## 2022-08-02 ENCOUNTER — Observation Stay
Admission: EM | Admit: 2022-08-02 | Discharge: 2022-08-03 | Disposition: A | Discharge status: Home / Self Care | Payer: Medicare HMO | Attending: Internal Medicine | Admitting: Internal Medicine

## 2022-08-02 ENCOUNTER — Other Ambulatory Visit: Payer: Self-pay

## 2022-08-02 ENCOUNTER — Emergency Department: Payer: Medicare HMO

## 2022-08-02 DIAGNOSIS — R0602 Shortness of breath: Secondary | ICD-10-CM | POA: Diagnosis not present

## 2022-08-02 DIAGNOSIS — I10 Essential (primary) hypertension: Secondary | ICD-10-CM | POA: Insufficient documentation

## 2022-08-02 DIAGNOSIS — U071 COVID-19: Principal | ICD-10-CM | POA: Insufficient documentation

## 2022-08-02 DIAGNOSIS — I2510 Atherosclerotic heart disease of native coronary artery without angina pectoris: Secondary | ICD-10-CM | POA: Diagnosis not present

## 2022-08-02 DIAGNOSIS — J209 Acute bronchitis, unspecified: Secondary | ICD-10-CM | POA: Diagnosis present

## 2022-08-02 DIAGNOSIS — F17210 Nicotine dependence, cigarettes, uncomplicated: Secondary | ICD-10-CM | POA: Diagnosis present

## 2022-08-02 DIAGNOSIS — Z743 Need for continuous supervision: Secondary | ICD-10-CM | POA: Diagnosis not present

## 2022-08-02 DIAGNOSIS — Z7982 Long term (current) use of aspirin: Secondary | ICD-10-CM | POA: Insufficient documentation

## 2022-08-02 DIAGNOSIS — J440 Chronic obstructive pulmonary disease with acute lower respiratory infection: Secondary | ICD-10-CM

## 2022-08-02 DIAGNOSIS — R062 Wheezing: Secondary | ICD-10-CM | POA: Diagnosis not present

## 2022-08-02 DIAGNOSIS — I959 Hypotension, unspecified: Secondary | ICD-10-CM | POA: Diagnosis not present

## 2022-08-02 DIAGNOSIS — R069 Unspecified abnormalities of breathing: Secondary | ICD-10-CM | POA: Diagnosis not present

## 2022-08-02 DIAGNOSIS — J441 Chronic obstructive pulmonary disease with (acute) exacerbation: Principal | ICD-10-CM | POA: Insufficient documentation

## 2022-08-02 DIAGNOSIS — Z87891 Personal history of nicotine dependence: Secondary | ICD-10-CM | POA: Diagnosis not present

## 2022-08-02 DIAGNOSIS — R636 Underweight: Secondary | ICD-10-CM | POA: Diagnosis present

## 2022-08-02 LAB — BASIC METABOLIC PANEL
Anion gap: 11 (ref 5–15)
BUN: 24 mg/dL — ABNORMAL HIGH (ref 8–23)
CO2: 21 mmol/L — ABNORMAL LOW (ref 22–32)
Calcium: 8.7 mg/dL — ABNORMAL LOW (ref 8.9–10.3)
Chloride: 104 mmol/L (ref 98–111)
Creatinine, Ser: 1.16 mg/dL — ABNORMAL HIGH (ref 0.44–1.00)
GFR, Estimated: 51 mL/min — ABNORMAL LOW (ref >60)
Glucose, Bld: 132 mg/dL — ABNORMAL HIGH (ref 70–99)
Potassium: 3.6 mmol/L (ref 3.5–5.1)
Sodium: 136 mmol/L (ref 135–145)

## 2022-08-02 LAB — CBC
HCT: 36.9 % (ref 36.0–46.0)
Hemoglobin: 12.3 g/dL (ref 12.0–15.0)
MCH: 31.8 pg (ref 26.0–34.0)
MCHC: 33.3 g/dL (ref 30.0–36.0)
MCV: 95.3 fL (ref 80.0–100.0)
Platelets: 254 10*3/uL (ref 150–400)
RBC: 3.87 MIL/uL (ref 3.87–5.11)
RDW: 13 % (ref 11.5–15.5)
WBC: 8.8 10*3/uL (ref 4.0–10.5)
nRBC: 0 % (ref 0.0–0.2)

## 2022-08-02 LAB — RESP PANEL BY RT-PCR (RSV, FLU A&B, COVID)  RVPGX2
Influenza A by PCR: NEGATIVE
Influenza B by PCR: NEGATIVE
Resp Syncytial Virus by PCR: NEGATIVE
SARS Coronavirus 2 by RT PCR: POSITIVE — AB

## 2022-08-02 LAB — TROPONIN I (HIGH SENSITIVITY)
Troponin I (High Sensitivity): 6 ng/L (ref <18)
Troponin I (High Sensitivity): 5 ng/L (ref <18)

## 2022-08-02 LAB — HIV ANTIBODY (ROUTINE TESTING W REFLEX): HIV Screen 4th Generation wRfx: NONREACTIVE

## 2022-08-02 MED ORDER — HYDROCOD POLI-CHLORPHE POLI ER 10-8 MG/5ML PO SUER: 5.0000 mL | Freq: Two times a day (BID) | ORAL | Status: DC | PRN

## 2022-08-02 MED ORDER — MOMETASONE FURO-FORMOTEROL FUM 200-5 MCG/ACT IN AERO
2.0000 | INHALATION_SPRAY | Freq: Two times a day (BID) | RESPIRATORY_TRACT | Status: DC
  Administered 2022-08-02 (×2): 2 via RESPIRATORY_TRACT
  Filled 2022-08-02: qty 8.8

## 2022-08-02 MED ORDER — IPRATROPIUM-ALBUTEROL 0.5-2.5 (3) MG/3ML IN SOLN
3.0000 mL | Freq: Once | RESPIRATORY_TRACT | Status: AC
  Administered 2022-08-02: 3 mL via RESPIRATORY_TRACT
  Filled 2022-08-02: qty 3

## 2022-08-02 MED ORDER — IPRATROPIUM-ALBUTEROL 0.5-2.5 (3) MG/3ML IN SOLN: 3.0000 mL | Freq: Four times a day (QID) | RESPIRATORY_TRACT | Status: DC | PRN

## 2022-08-02 MED ORDER — ONDANSETRON HCL 4 MG PO TABS: 4.0000 mg | ORAL_TABLET | Freq: Four times a day (QID) | ORAL | Status: DC | PRN

## 2022-08-02 MED ORDER — ALBUTEROL SULFATE HFA 108 (90 BASE) MCG/ACT IN AERS: 2.0000 | INHALATION_SPRAY | RESPIRATORY_TRACT | Status: DC | PRN

## 2022-08-02 MED ORDER — GUAIFENESIN-DM 100-10 MG/5ML PO SYRP
10.0000 mL | ORAL_SOLUTION | ORAL | Status: DC | PRN
  Administered 2022-08-03: 10 mL via ORAL
  Filled 2022-08-02: qty 10

## 2022-08-02 MED ORDER — ACETAMINOPHEN 650 MG RE SUPP: 650.0000 mg | Freq: Four times a day (QID) | RECTAL | Status: DC | PRN

## 2022-08-02 MED ORDER — NIRMATRELVIR/RITONAVIR (PAXLOVID) TABLET (RENAL DOSING)
2.0000 | ORAL_TABLET | Freq: Two times a day (BID) | ORAL | Status: DC
  Administered 2022-08-02 – 2022-08-03 (×3): 2 via ORAL
  Filled 2022-08-02: qty 20

## 2022-08-02 MED ORDER — METHYLPREDNISOLONE SODIUM SUCC 40 MG IJ SOLR
40.0000 mg | Freq: Two times a day (BID) | INTRAMUSCULAR | Status: AC
  Administered 2022-08-02 (×2): 40 mg via INTRAVENOUS
  Filled 2022-08-02 (×2): qty 1

## 2022-08-02 MED ORDER — ROSUVASTATIN CALCIUM 20 MG PO TABS: 40.0000 mg | ORAL_TABLET | ORAL | Status: DC

## 2022-08-02 MED ORDER — ASPIRIN 81 MG PO TBEC
81.0000 mg | DELAYED_RELEASE_TABLET | Freq: Every day | ORAL | Status: DC
  Administered 2022-08-02 – 2022-08-03 (×2): 81 mg via ORAL
  Filled 2022-08-02 (×2): qty 1

## 2022-08-02 MED ORDER — IPRATROPIUM-ALBUTEROL 0.5-2.5 (3) MG/3ML IN SOLN
3.0000 mL | Freq: Four times a day (QID) | RESPIRATORY_TRACT | Status: DC
  Administered 2022-08-02 (×2): 3 mL via RESPIRATORY_TRACT
  Filled 2022-08-02 (×2): qty 3

## 2022-08-02 MED ORDER — IPRATROPIUM-ALBUTEROL 0.5-2.5 (3) MG/3ML IN SOLN: 3.0000 mL | RESPIRATORY_TRACT | Status: DC | PRN

## 2022-08-02 MED ORDER — IPRATROPIUM-ALBUTEROL 20-100 MCG/ACT IN AERS: 1.0000 | INHALATION_SPRAY | Freq: Four times a day (QID) | RESPIRATORY_TRACT | Status: DC | PRN

## 2022-08-02 MED ORDER — ALBUTEROL SULFATE HFA 108 (90 BASE) MCG/ACT IN AERS
2.0000 | INHALATION_SPRAY | Freq: Four times a day (QID) | RESPIRATORY_TRACT | Status: DC
  Administered 2022-08-02 – 2022-08-03 (×3): 2 via RESPIRATORY_TRACT
  Filled 2022-08-02: qty 6.7

## 2022-08-02 MED ORDER — METHYLPREDNISOLONE SODIUM SUCC 125 MG IJ SOLR
125.0000 mg | Freq: Once | INTRAMUSCULAR | Status: AC
  Administered 2022-08-02: 125 mg via INTRAVENOUS
  Filled 2022-08-02: qty 2

## 2022-08-02 MED ORDER — PREDNISONE 20 MG PO TABS
40.0000 mg | ORAL_TABLET | Freq: Every day | ORAL | Status: DC
  Administered 2022-08-03: 40 mg via ORAL
  Filled 2022-08-02: qty 2

## 2022-08-02 MED ORDER — ACETAMINOPHEN 325 MG PO TABS
650.0000 mg | ORAL_TABLET | Freq: Four times a day (QID) | ORAL | Status: DC | PRN
  Administered 2022-08-03: 650 mg via ORAL
  Filled 2022-08-02: qty 2

## 2022-08-02 MED ORDER — ENOXAPARIN SODIUM 30 MG/0.3ML IJ SOSY
30.0000 mg | PREFILLED_SYRINGE | INTRAMUSCULAR | Status: DC
  Administered 2022-08-02 – 2022-08-03 (×2): 30 mg via SUBCUTANEOUS
  Filled 2022-08-02 (×2): qty 0.3

## 2022-08-02 MED ORDER — ONDANSETRON HCL 4 MG/2ML IJ SOLN: 4.0000 mg | Freq: Four times a day (QID) | INTRAMUSCULAR | Status: DC | PRN

## 2022-08-02 MED ORDER — NICOTINE 21 MG/24HR TD PT24
21.0000 mg | MEDICATED_PATCH | Freq: Every day | TRANSDERMAL | Status: DC
  Filled 2022-08-02 (×2): qty 1

## 2022-08-02 NOTE — ED Provider Notes (Addendum)
[facility] Provider Note    Event Date/Time   First MD Initiated Contact with Patient 08/02/22 0308     (approximate)   History   Shortness of Breath   HPI  Sierra Brown is a 72 y.o. female with history of COPD, emphysema, tobacco use but quit 2 weeks ago who presents to the emergency department with shortness of breath, wheezing, nonproductive cough for the past 2 days.  Symptoms started right after she stopped her last round of prednisone.  No fever.  No lower extremity swelling or pain.  Has pulmonology consult this upcoming Friday.  Did get a nebulizer machine for home and gave herself 3 albuterol treatments without much relief.  Received DuoNeb with EMS and states is feeling somewhat better.   History provided by patient.    Past Medical History:  Diagnosis Date   Allergy    allerlgic rhinitis   Cataract    COPD (chronic obstructive pulmonary disease) ([place])    Pneumonia    Tobacco use disorder     Past Surgical History:  Procedure Laterality Date   CESAREAN SECTION     TONSILLECTOMY      MEDICATIONS:  Prior to Admission medications   Medication Sig Start Date End Date Taking? Authorizing Provider  albuterol (VENTOLIN HFA) 108 (90 Base) MCG/ACT inhaler Inhale 2 puffs into the lungs every 6 (six) hours as needed for wheezing or shortness of breath. 06/03/22   Gwyneth Sprout, FNP  albuterol (VENTOLIN HFA) 108 (90 Base) MCG/ACT inhaler Inhale 2-4 puffs into the lungs every 4 (four) hours as needed for wheezing or shortness of breath. 07/22/22   Ellia Knowlton, Delice Bison, DO  aspirin EC 81 MG tablet Take 1 tablet (81 mg total) by mouth daily. Swallow whole. 01/25/22   Gwyneth Sprout, FNP  ipratropium-albuterol (DUONEB) 0.5-2.5 (3) MG/3ML SOLN Take 3 mLs by nebulization every 6 (six) hours as needed. 07/26/22   Gwyneth Sprout, FNP  Misc. Devices (FINGERTIP PULSE OXIMETER) MISC 1 Device by Does not apply route daily. 07/23/22   Rada Hay, MD   nystatin (MYCOSTATIN) 100000 UNIT/ML suspension Take 5 mLs (500,000 Units total) by mouth 4 (four) times daily. 07/23/22   Rada Hay, MD  predniSONE (DELTASONE) 20 MG tablet Take 3 tablets (60 mg total) by mouth daily. 07/22/22   Kearney Evitt, Delice Bison, DO  rosuvastatin (CRESTOR) 40 MG tablet Take 1 tablet (40 mg total) by mouth every other day. 06/03/22   Gwyneth Sprout, FNP  Spacer/Aero-Holding Chambers (AEROCHAMBER MV) inhaler Use as instructed 06/15/21   Gwyneth Sprout, FNP    Physical Exam   Triage Vital Signs: ED Triage Vitals  Enc Vitals Group     BP 08/02/22 0210 (!) 139/49     Pulse Rate 08/02/22 0210 88     Resp 08/02/22 0210 (!) 24     Temp 08/02/22 0210 97.7 F (36.5 C)     Temp Source 08/02/22 0210 Oral     SpO2 08/02/22 0206 97 %     Weight --      Height --      Head Circumference --      Peak Flow --      Pain Score --      Pain Loc --      Pain Edu? --      Excl. in [place]? --     Most recent vital signs: Vitals:   08/02/22 0206 08/02/22 0210  BP:  Marland Kitchen)  139/49  Pulse:  88  Resp:  (!) 24  Temp:  97.7 F (36.5 C)  SpO2: 97% 100%    CONSTITUTIONAL: Alert, responds appropriately to questions. Well-appearing; well-nourished HEAD: Normocephalic, atraumatic EYES: Conjunctivae clear, pupils appear equal, sclera nonicteric ENT: normal nose; moist mucous membranes NECK: Supple, normal ROM CARD: RRR; S1 and S2 appreciated RESP: Slightly tachypneic.  No hypoxia.  Diminished aeration at bases bilaterally.  No rhonchi, wheezing or rales currently. ABD/GI: Non-distended; soft, non-tender, no rebound, no guarding, no peritoneal signs BACK: The back appears normal EXT: Normal ROM in all joints; no deformity noted, no edema, no calf tenderness or calf swelling SKIN: Normal color for age and race; warm; no rash on exposed skin NEURO: Moves all extremities equally, normal speech PSYCH: The patient's mood and manner are appropriate.   ED Results / Procedures / Treatments    LABS: (all labs ordered are listed, but only abnormal results are displayed) Labs Reviewed  RESP PANEL BY RT-PCR (RSV, FLU A&B, COVID)  RVPGX2 - Abnormal; Notable for the following components:      Result Value   SARS Coronavirus 2 by RT PCR POSITIVE (*)    All other components within normal limits  BASIC METABOLIC PANEL - Abnormal; Notable for the following components:   CO2 21 (*)    Glucose, Bld 132 (*)    BUN 24 (*)    Creatinine, Ser 1.16 (*)    Calcium 8.7 (*)    GFR, Estimated 51 (*)    All other components within normal limits  CBC  TROPONIN I (HIGH SENSITIVITY)     EKG:    Date: 08/02/2022 2:14 AM  Rate: 89  Rhythm: normal sinus rhythm  QRS Axis: normal  Intervals: normal  ST/T Wave abnormalities: normal  Conduction Disutrbances: none  Narrative Interpretation: unremarkable    RADIOLOGY: My personal review and interpretation of imaging: Chest x-ray clear.  I have personally reviewed all radiology reports.   DG Chest 2 View  Result Date: 08/02/2022 CLINICAL DATA:  Shortness of breath EXAM: CHEST - 2 VIEW COMPARISON:  07/23/2022 FINDINGS: There is hyperinflation of the lungs compatible with COPD. Biapical and bibasilar scarring. Heart is borderline in size. Mediastinal contours within normal limits. Aortic atherosclerosis. No acute confluent opacities or effusions. No acute bony abnormality. IMPRESSION: COPD/chronic changes.  No active disease. Electronically Signed   By: Rolm Baptise M.D.   On: 08/02/2022 03:24     PROCEDURES:  Critical Care performed: No     .1-3 Lead EKG Interpretation  Performed by: Marvia Troost, Delice Bison, DO Authorized by: Chyanna Flock, Delice Bison, DO     Interpretation: normal     ECG rate:  88   ECG rate assessment: normal     Rhythm: sinus rhythm     Ectopy: none     Conduction: normal       IMPRESSION / MDM / ASSESSMENT AND PLAN / ED COURSE  I reviewed the triage vital signs and the nursing notes.    Patient here with shortness  of breath, wheezing for the past 2 days.  The patient is on the cardiac monitor to evaluate for evidence of arrhythmia and/or significant heart rate changes.   DIFFERENTIAL DIAGNOSIS (includes but not limited to):   COPD exacerbation, viral URI, pneumonia, CHF, PE   Patient's presentation is most consistent with acute presentation with potential threat to life or bodily function.   PLAN: Patient's labs show no leukocytosis.  Slightly elevated creatinine compared to previous.  Troponin negative.  She is positive for COVID-19 which is likely exacerbating her COPD symptoms.  Chest x-ray reviewed and interpreted by myself and the radiologist and shows no acute abnormality.  Patient's wheezing has improved but still diminished aeration with tachypnea.  No hypoxia at rest but patient states she gets very short of breath with exertion.  Have offered another breathing treatment but patient states she is already very tremulous from 3 breathing treatments at home and 1 with EMS.  Will give Solu-Medrol and Paxlovid.  Will discuss with hospitalist for admission for COPD exacerbation in the setting of COVID-19.   Pharmacist Ovid Curd calculated patient's risk and entered appropriate Paxlovid dosing.  States she is a candidate as she has high risk for complications from [place].   MEDICATIONS GIVEN IN ED: Medications  methylPREDNISolone sodium succinate (SOLU-MEDROL) 125 mg/2 mL injection 125 mg (has no administration in time range)  ipratropium-albuterol (DUONEB) 0.5-2.5 (3) MG/3ML nebulizer solution 3 mL (has no administration in time range)  nirmatrelvir/ritonavir (renal dosing) (PAXLOVID) 2 tablet (has no administration in time range)        CONSULTS:  Consulted and discussed patient's case with hospitalist, Dr. Damita Dunnings.  I have recommended admission and consulting physician agrees and will place admission orders.  Patient (and family if present) agree with this plan.   I reviewed all nursing notes,  vitals, pertinent previous records.  All labs, EKGs, imaging ordered have been independently reviewed and interpreted by myself.    OUTSIDE RECORDS REVIEWED: Reviewed patient's recent PCP notes.       FINAL CLINICAL IMPRESSION(S) / ED DIAGNOSES   Final diagnoses:  COVID-19  COPD exacerbation ([place])     Rx / DC Orders   ED Discharge Orders     None        Note:  This document was prepared using Dragon voice recognition software and may include unintentional dictation errors.   Glynis Hunsucker, Delice Bison, DO 08/02/22 [place], Delice Bison, DO 08/02/22 [phone]

## 2022-08-02 NOTE — ED Notes (Signed)
Pt ambulated to and from bathroom with independent and steady gait. Pt positioned self back in bed for comfort. Bed low and locked with side rails raised. Monitor in place and call bell in reach. Lights off in room per pt request and pt watching tv. Pt denies further needs at this time.

## 2022-08-02 NOTE — Evaluation (Signed)
Occupational Therapy Evaluation Patient Details Name: Sierra Brown MRN: [id number] DOB: 02-05-1951 Today's Date: 08/02/2022   History of Present Illness 72 y.o. female with medical history significant for COPD, tobacco use disorder, HTN, HLD and nonobstructive CAD, treated for the flu a couple months prior who presents to the ED with a 1 week history of cough and shortness of breath not responding to home albuterol treatment.  She arrived by EMS and had increased work of breathing, tachypneic to 24 but not hypoxic.  She received DuoNebs and route.  Workup in the ED significant for positive COVID   Clinical Impression   Upon entering the room, pt supine in bed and agreeable to OT intervention. Pt with no c/o pain but expresses frustrations as she endorses getting multiple illnesses over the last several months. Pt lives at home with husband and is very active at baseline. 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 Pt performed bed mobility without assistance and dons B shoes. Pt on RA during session. Pt ambulates in hallway 300' without use of AD and no LOB. Pt's O2 saturation remains at or above 95% throughout. She endorses feeling close to her baseline at this time. No skilled acute OT needs with pt verbalizing agreement. OT to complete orders.      Recommendations for follow up therapy are one component of a multi-disciplinary discharge planning process, led by the attending physician.  Recommendations may be updated based on patient status, additional functional criteria and insurance authorization.   Follow Up Recommendations  No OT follow up     Assistance Recommended at Discharge PRN     Functional Status Assessment  Patient has not had a recent decline in their functional status  Equipment Recommendations  None recommended by OT       Precautions / Restrictions Precautions Precautions: None      Mobility Bed  Mobility Overal bed mobility: Independent                  Transfers Overall transfer level: Independent Equipment used: None                      Balance Overall balance assessment: Independent                                         ADL either performed or assessed with clinical judgement   ADL Overall ADL's : Independent                                             Vision Patient Visual Report: No change from baseline              Pertinent Vitals/Pain Pain Assessment Pain Assessment: No/denies pain     Hand Dominance     Extremity/Trunk Assessment Upper Extremity Assessment Upper Extremity Assessment: Overall WFL for tasks assessed   Lower Extremity Assessment Lower Extremity Assessment: Overall WFL for tasks assessed       Communication Communication Communication: No difficulties   Cognition Arousal/Alertness: Awake/alert Behavior During Therapy: WFL for tasks assessed/performed Overall Cognitive Status: Within Functional Limits for tasks assessed  Home Living Family/patient expects to be discharged to:: Private residence Living Arrangements: Spouse/significant other Available Help at Discharge: Family;Available 24 hours/day Type of Home: House Home Access: Stairs to enter [employer] of Steps: 2 Entrance Stairs-Rails: Right;Left Home Layout: One level     Bathroom Shower/Tub: Tub/shower unit         Home Equipment: None          Prior Functioning/Environment Prior Level of Function : Independent/Modified Independent;Driving               ADLs Comments: Pt is Ind in all aspects of care and is very active caring for horses during the day (lifting 50 lbs feed bags, etc)                 OT Goals(Current goals can be found in the care plan section) Acute Rehab OT Goals Patient Stated Goal: to return  home OT Goal Formulation: With patient Time For Goal Achievement: 08/02/22 Potential to Achieve Goals: Good  OT Frequency:         AM-PAC OT "6 Clicks" Daily Activity     Outcome Measure Help from another person eating meals?: None Help from another person taking care of personal grooming?: None Help from another person toileting, which includes using toliet, bedpan, or urinal?: None Help from another person bathing (including washing, rinsing, drying)?: None Help from another person to put on and taking off regular upper body clothing?: None Help from another person to put on and taking off regular lower body clothing?: None 6 Click Score: 24   End of Session Nurse Communication: Mobility status  Activity Tolerance: Patient tolerated treatment well Patient left: in bed;with call bell/phone within reach                   Time: 0900-0924 OT Time Calculation (min): 24 min Charges:  OT General Charges $OT Visit: 1 Visit OT Evaluation $OT Eval Low Complexity: 1 Low  Darleen Crocker, MS, OTR/L , CBIS ascom [phone]  08/02/22, 11:25 AM

## 2022-08-02 NOTE — ED Triage Notes (Signed)
Pt presents via POV with complaints of SOB and cough that started on ~1/27. Hx of COPD. Pt received a duoneb from EMS en route - pt endorses improvement in her sx since receiving the breathing treatment. She also had 3 albuterol at home. Denies fevers, chills, CP.

## 2022-08-02 NOTE — ED Triage Notes (Signed)
EMS brings pt in from home for c/o [facility] and cough since last 1/27; wheezing on arrival; duoneb admin enroute and 3 albuterol at home; hx COPD

## 2022-08-02 NOTE — Assessment & Plan Note (Signed)
Continue aspirin and rosuvastatin ?

## 2022-08-02 NOTE — Progress Notes (Signed)
PT Cancellation Note  Patient Details Name: SHARIFA BUCHOLZ MRN: [id number] DOB: [birth year]   Cancelled Treatment:    Reason Eval/Treat Not Completed: PT screened, no needs identified, will sign off (Per discussion with OT, patient is independent with mobility and declined PT evaluation. Please re-consult if needed.)  Minna Merritts, PT, MPT  Percell Locus 08/02/2022, 9:28 AM

## 2022-08-02 NOTE — Assessment & Plan Note (Signed)
Not currently on BP meds

## 2022-08-02 NOTE — Assessment & Plan Note (Signed)
Consulted dietary consult

## 2022-08-02 NOTE — H&P (Signed)
History and Physical    Patient: Sierra Brown:[id number] DOB: 02-Feb-1951 DOA: 08/02/2022 DOS: the patient was seen and examined on 08/02/2022 PCP: Gwyneth Sprout, FNP  Patient coming from: Home  Chief Complaint:  Chief Complaint  Patient presents with   Shortness of Breath    HPI: Sierra Brown is a 72 y.o. female with medical history significant for COPD, tobacco use disorder, HTN, HLD and nonobstructive CAD, treated for the flu a couple months prior who presents to the ED with a 1 week history of cough and shortness of breath not responding to home albuterol treatment.  She arrived by EMS and had increased work of breathing, tachypneic to 24 but not hypoxic.  She received DuoNebs and route.  Workup in the ED significant for positive COVID on respiratory viral panel.  She also had a creatinine elevation of 1.16 above baseline of 0.75.  EKG showed NSR at 89 with nonspecific ST-T wave changes.  Chest x-ray showed chronic COPD changes and no active disease.  Patient was treated with an additional DuoNeb as well as methylprednisolone and magnesium in the ED.  She was also started on Paxlovid.  Hospitalist consulted for admission due to continued increased work of breathing.   Review of Systems: As mentioned in the history of present illness. All other systems reviewed and are negative.  Past Medical History:  Diagnosis Date   Allergy    allerlgic rhinitis   Cataract    COPD (chronic obstructive pulmonary disease) ([place])    Pneumonia    Tobacco use disorder    Past Surgical History:  Procedure Laterality Date   CESAREAN SECTION     TONSILLECTOMY     Social History:  reports that she quit smoking 12 days ago. Her smoking use included cigarettes. She has a 36.75 pack-year smoking history. She has never used smokeless tobacco. She reports that she does not drink alcohol and does not use drugs.  Allergies  Allergen Reactions   Atorvastatin Other (See Comments)    Myalgias    Codeine Nausea And Vomiting   Levofloxacin Nausea And Vomiting   Sulfonamide Derivatives Hives   Penicillins Hives and Rash   Sulfa Antibiotics Hives and Rash    Family History  Problem Relation Age of Onset   Ulcerative colitis Mother        colostomy   Hypertension Mother    Heart Problems Mother    Breast cancer Neg Hx     Prior to Admission medications   Medication Sig Start Date End Date Taking? Authorizing Provider  albuterol (VENTOLIN HFA) 108 (90 Base) MCG/ACT inhaler Inhale 2-4 puffs into the lungs every 4 (four) hours as needed for wheezing or shortness of breath. 07/22/22   Ward, Delice Bison, DO  aspirin EC 81 MG tablet Take 1 tablet (81 mg total) by mouth daily. Swallow whole. 01/25/22   Gwyneth Sprout, FNP  ipratropium-albuterol (DUONEB) 0.5-2.5 (3) MG/3ML SOLN Take 3 mLs by nebulization every 6 (six) hours as needed. 07/26/22   Gwyneth Sprout, FNP  Misc. Devices (FINGERTIP PULSE OXIMETER) MISC 1 Device by Does not apply route daily. 07/23/22   Rada Hay, MD  nystatin (MYCOSTATIN) 100000 UNIT/ML suspension Take 5 mLs (500,000 Units total) by mouth 4 (four) times daily. Patient not taking: Reported on 08/02/2022 07/23/22   Rada Hay, MD  predniSONE (DELTASONE) 20 MG tablet Take 3 tablets (60 mg total) by mouth daily. Patient not taking: Reported on 08/02/2022 07/22/22  Ward, Delice Bison, DO  rosuvastatin (CRESTOR) 40 MG tablet Take 1 tablet (40 mg total) by mouth every other day. 06/03/22   Gwyneth Sprout, FNP  Spacer/Aero-Holding Chambers (AEROCHAMBER MV) inhaler Use as instructed 06/15/21   Gwyneth Sprout, FNP    Physical Exam: Vitals:   08/02/22 0206 08/02/22 0210  BP:  (!) 139/49  Pulse:  88  Resp:  (!) 24  Temp:  97.7 F (36.5 C)  TempSrc:  Oral  SpO2: 97% 100%   Physical Exam Vitals and nursing note reviewed.  Constitutional:      General: She is not in acute distress. HENT:     Head: Normocephalic and atraumatic.  Cardiovascular:     Rate and  Rhythm: Normal rate and regular rhythm.     Heart sounds: Normal heart sounds.  Pulmonary:     Effort: Tachypnea present.     Breath sounds: Wheezing present.  Abdominal:     Palpations: Abdomen is soft.     Tenderness: There is no abdominal tenderness.  Neurological:     Mental Status: Mental status is at baseline.     Labs on Admission: I have personally reviewed following labs and imaging studies  CBC: Recent Labs  Lab 08/02/22 0210  WBC 8.8  HGB 12.3  HCT 36.9  MCV 95.3  PLT 269   Basic Metabolic Panel: Recent Labs  Lab 08/02/22 0210  NA 136  K 3.6  CL 104  CO2 21*  GLUCOSE 132*  BUN 24*  CREATININE 1.16*  CALCIUM 8.7*   GFR: Estimated Creatinine Clearance: 32.2 mL/min (A) (by C-G formula based on SCr of 1.16 mg/dL (H)). Liver Function Tests: No results for input(s): "AST", "ALT", "ALKPHOS", "BILITOT", "PROT", "ALBUMIN" in the last 168 hours. No results for input(s): "LIPASE", "AMYLASE" in the last 168 hours. No results for input(s): "AMMONIA" in the last 168 hours. Coagulation Profile: No results for input(s): "INR", "PROTIME" in the last 168 hours. Cardiac Enzymes: No results for input(s): "CKTOTAL", "CKMB", "CKMBINDEX", "TROPONINI" in the last 168 hours. BNP (last 3 results) Recent Labs    07/12/22 0849  PROBNP 137   HbA1C: No results for input(s): "HGBA1C" in the last 72 hours. CBG: No results for input(s): "GLUCAP" in the last 168 hours. Lipid Profile: No results for input(s): "CHOL", "HDL", "LDLCALC", "TRIG", "CHOLHDL", "LDLDIRECT" in the last 72 hours. Thyroid Function Tests: No results for input(s): "TSH", "T4TOTAL", "FREET4", "T3FREE", "THYROIDAB" in the last 72 hours. Anemia Panel: No results for input(s): "VITAMINB12", "FOLATE", "FERRITIN", "TIBC", "IRON", "RETICCTPCT" in the last 72 hours. Urine analysis:    Component Value Date/Time   COLORURINE YELLOW (A) 02/01/2021 1837   APPEARANCEUR HAZY (A) 02/01/2021 1837   LABSPEC 1.017  02/01/2021 1837   PHURINE 5.0 02/01/2021 1837   GLUCOSEU NEGATIVE 02/01/2021 1837   HGBUR LARGE (A) 02/01/2021 1837   BILIRUBINUR NEGATIVE 02/01/2021 1837   BILIRUBINUR negative 08/05/2020 1450   KETONESUR NEGATIVE 02/01/2021 1837   PROTEINUR 30 (A) 02/01/2021 1837   UROBILINOGEN 0.2 08/05/2020 1450   NITRITE NEGATIVE 02/01/2021 1837   LEUKOCYTESUR NEGATIVE 02/01/2021 1837    Radiological Exams on Admission: DG Chest 2 View  Result Date: 08/02/2022 CLINICAL DATA:  Shortness of breath EXAM: CHEST - 2 VIEW COMPARISON:  07/23/2022 FINDINGS: There is hyperinflation of the lungs compatible with COPD. Biapical and bibasilar scarring. Heart is borderline in size. Mediastinal contours within normal limits. Aortic atherosclerosis. No acute confluent opacities or effusions. No acute bony abnormality. IMPRESSION: COPD/chronic changes.  No active disease. Electronically Signed   By: Rolm Baptise M.D.   On: 08/02/2022 03:24     Data Reviewed: Relevant notes from primary care and specialist visits, past discharge summaries as available in EHR, including Care Everywhere. Prior diagnostic testing as pertinent to current admission diagnoses Updated medications and problem lists for reconciliation ED course, including vitals, labs, imaging, treatment and response to treatment Triage notes, nursing and pharmacy notes and ED provider's notes Notable results as noted in HPI   Assessment and Plan: COPD with acute bronchitis ([place]) COVID-19 infection Patient started on Paxlovid from the ED Continue bronchodilators Antitussives and supportive care Airborne precautions  Tobacco dependence due to cigarettes Nicotine patch  Underweight Consulted dietary consult  Hypertension Not currently on BP meds  Coronary artery calcification seen on CT scan Continue aspirin and rosuvastatin        DVT prophylaxis: Lovenox  Consults: none  Advance Care Planning:   Code Status: Prior   Family  Communication: none  Disposition Plan: Back to previous home environment  Severity of Illness: The appropriate patient status for this patient is INPATIENT. Inpatient status is judged to be reasonable and necessary in order to provide the required intensity of service to ensure the patient's safety. The patient's presenting symptoms, physical exam findings, and initial radiographic and laboratory data in the context of their chronic comorbidities is felt to place them at high risk for further clinical deterioration. Furthermore, it is not anticipated that the patient will be medically stable for discharge from the hospital within 2 midnights of admission.   * I certify that at the point of admission it is my clinical judgment that the patient will require inpatient hospital care spanning beyond 2 midnights from the point of admission due to high intensity of service, high risk for further deterioration and high frequency of surveillance required.*  Author: Athena Masse, MD 08/02/2022 4:49 AM  For on call review [email or web address].

## 2022-08-02 NOTE — Progress Notes (Addendum)
PHARMACIST - PHYSICIAN COMMUNICATION  CONCERNING:  Enoxaparin (Lovenox) for DVT Prophylaxis    RECOMMENDATION: Patient was prescribed enoxaprin 40mg  q24 hours for VTE prophylaxis.   There were no vitals filed for this visit.  There is no height or weight on file to calculate BMI.  Estimated Creatinine Clearance: 32.2 mL/min (A) (by C-G formula based on SCr of 1.16 mg/dL (H)).  Patient is candidate for enoxaparin 30mg  every 24 hours based on CrCl <14ml/min or Weight <45kg  DESCRIPTION: Pharmacy has adjusted enoxaparin dose per [facility] policy.  Patient is now receiving enoxaparin 30 mg every 24 hours   Renda Rolls, PharmD, [facility] 08/02/2022 5:10 AM

## 2022-08-02 NOTE — Assessment & Plan Note (Signed)
Nicotine patch  

## 2022-08-02 NOTE — Progress Notes (Signed)
Patient admitted early this morning for worsening shortness of breath and was found to have COVID-19 infection along with COPD with acute bronchitis and has been started on Paxlovid.  Patient seen and examined at bedside and plan of care discussed with her.  I have reviewed patient's medical records including this morning's H&P, current vitals, labs and medications myself.  Continue IV Solu-Medrol for now.  Patient is currently on room air.

## 2022-08-02 NOTE — Assessment & Plan Note (Signed)
COVID-19 infection Patient started on Paxlovid from the ED Continue bronchodilators Antitussives and supportive care Airborne precautions

## 2022-08-03 DIAGNOSIS — R69 Illness, unspecified: Secondary | ICD-10-CM | POA: Diagnosis not present

## 2022-08-03 DIAGNOSIS — U071 COVID-19: Secondary | ICD-10-CM | POA: Diagnosis not present

## 2022-08-03 DIAGNOSIS — J441 Chronic obstructive pulmonary disease with (acute) exacerbation: Secondary | ICD-10-CM | POA: Diagnosis not present

## 2022-08-03 DIAGNOSIS — F17210 Nicotine dependence, cigarettes, uncomplicated: Secondary | ICD-10-CM

## 2022-08-03 DIAGNOSIS — J209 Acute bronchitis, unspecified: Secondary | ICD-10-CM | POA: Diagnosis present

## 2022-08-03 DIAGNOSIS — I10 Essential (primary) hypertension: Secondary | ICD-10-CM

## 2022-08-03 LAB — MAGNESIUM: Magnesium: 2.2 mg/dL (ref 1.7–2.4)

## 2022-08-03 LAB — CBC WITH DIFFERENTIAL/PLATELET
Abs Immature Granulocytes: 0.26 10*3/uL — ABNORMAL HIGH (ref 0.00–0.07)
Basophils Absolute: 0 10*3/uL (ref 0.0–0.1)
Basophils Relative: 0 %
Eosinophils Absolute: 0 10*3/uL (ref 0.0–0.5)
Eosinophils Relative: 0 %
HCT: 33.4 % — ABNORMAL LOW (ref 36.0–46.0)
Hemoglobin: 11.5 g/dL — ABNORMAL LOW (ref 12.0–15.0)
Immature Granulocytes: 1 %
Lymphocytes Relative: 3 %
Lymphs Abs: 0.6 10*3/uL — ABNORMAL LOW (ref 0.7–4.0)
MCH: 32.2 pg (ref 26.0–34.0)
MCHC: 34.4 g/dL (ref 30.0–36.0)
MCV: 93.6 fL (ref 80.0–100.0)
Monocytes Absolute: 0.8 10*3/uL (ref 0.1–1.0)
Monocytes Relative: 4 %
Neutro Abs: 19.1 10*3/uL — ABNORMAL HIGH (ref 1.7–7.7)
Neutrophils Relative %: 92 %
Platelets: 244 10*3/uL (ref 150–400)
RBC: 3.57 MIL/uL — ABNORMAL LOW (ref 3.87–5.11)
RDW: 13.1 % (ref 11.5–15.5)
WBC: 20.8 10*3/uL — ABNORMAL HIGH (ref 4.0–10.5)
nRBC: 0 % (ref 0.0–0.2)

## 2022-08-03 LAB — C-REACTIVE PROTEIN: CRP: 0.6 mg/dL (ref <1.0)

## 2022-08-03 LAB — COMPREHENSIVE METABOLIC PANEL
ALT: 18 U/L (ref 0–44)
AST: 28 U/L (ref 15–41)
Albumin: 3.2 g/dL — ABNORMAL LOW (ref 3.5–5.0)
Alkaline Phosphatase: 58 U/L (ref 38–126)
Anion gap: 10 (ref 5–15)
BUN: 19 mg/dL (ref 8–23)
CO2: 22 mmol/L (ref 22–32)
Calcium: 8.8 mg/dL — ABNORMAL LOW (ref 8.9–10.3)
Chloride: 106 mmol/L (ref 98–111)
Creatinine, Ser: 0.67 mg/dL (ref 0.44–1.00)
GFR, Estimated: >60 mL/min (ref >60)
Glucose, Bld: 178 mg/dL — ABNORMAL HIGH (ref 70–99)
Potassium: 3.7 mmol/L (ref 3.5–5.1)
Sodium: 138 mmol/L (ref 135–145)
Total Bilirubin: 0.9 mg/dL (ref 0.3–1.2)
Total Protein: 6 g/dL — ABNORMAL LOW (ref 6.5–8.1)

## 2022-08-03 LAB — D-DIMER, QUANTITATIVE: D-Dimer, Quant: 0.35 ug/mL-FEU (ref 0.00–0.50)

## 2022-08-03 MED ORDER — UMECLIDINIUM-VILANTEROL 62.5-25 MCG/ACT IN AEPB
1.0000 | INHALATION_SPRAY | Freq: Every day | RESPIRATORY_TRACT | Status: DC
  Administered 2022-08-03: 1 via RESPIRATORY_TRACT
  Filled 2022-08-03 (×2): qty 14

## 2022-08-03 MED ORDER — NIRMATRELVIR/RITONAVIR (PAXLOVID) TABLET (RENAL DOSING): 2.0000 | ORAL_TABLET | Freq: Two times a day (BID) | ORAL | 0 refills | Status: DC

## 2022-08-03 MED ORDER — ALUM & MAG HYDROXIDE-SIMETH 200-200-20 MG/5ML PO SUSP
30.0000 mL | Freq: Four times a day (QID) | ORAL | Status: DC | PRN
  Administered 2022-08-03: 30 mL via ORAL
  Filled 2022-08-03: qty 30

## 2022-08-03 MED ORDER — PREDNISONE 20 MG PO TABS: 20.0000 mg | ORAL_TABLET | Freq: Every day | ORAL | 0 refills | Status: AC

## 2022-08-03 MED ORDER — UMECLIDINIUM-VILANTEROL 62.5-25 MCG/ACT IN AEPB: 1.0000 | INHALATION_SPRAY | Freq: Every day | RESPIRATORY_TRACT | 1 refills | Status: DC

## 2022-08-03 MED ORDER — PREDNISONE 20 MG PO TABS: 20.0000 mg | ORAL_TABLET | Freq: Every day | ORAL | Status: DC

## 2022-08-03 MED ORDER — SALINE SPRAY 0.65 % NA SOLN: 1.0000 | NASAL | 0 refills | Status: AC | PRN

## 2022-08-03 MED ORDER — GUAIFENESIN-DM 100-10 MG/5ML PO SYRP: 10.0000 mL | ORAL_SOLUTION | ORAL | 0 refills | Status: DC | PRN

## 2022-08-03 MED ORDER — NICOTINE 21 MG/24HR TD PT24: 21.0000 mg | MEDICATED_PATCH | Freq: Every day | TRANSDERMAL | 0 refills | Status: DC

## 2022-08-03 MED ORDER — SALINE SPRAY 0.65 % NA SOLN
1.0000 | NASAL | Status: DC | PRN
  Administered 2022-08-03: 1 via NASAL
  Filled 2022-08-03: qty 44

## 2022-08-03 NOTE — Discharge Summary (Signed)
Physician Discharge Summary   Patient: Sierra Brown MRN: [id number] DOB: [birth year]  Admit date:     08/02/2022  Discharge date: 08/03/22  Discharge Physician: Fritzi Mandes   PCP: Gwyneth Sprout, FNP   Recommendations at discharge:    Patient advised smoking cessation use inhalers as instructed patient advised to follow-up pulmonology on her appointment  Discharge Diagnoses: COVID infection COPD exacerbation--mild  Hospital Course:  Sierra Brown is a 72 y.o. female with medical history significant for COPD, tobacco use disorder, HTN, HLD and nonobstructive CAD, treated for the flu a couple months prior who presents to the ED with a 1 week history of cough and shortness of breath not responding to home albuterol treatment.  She arrived by EMS and had increased work of breathing, tachypneic to 24 but not hypoxic.  Chest x-ray showed chronic COPD changes and no active disease. Patient was treated with an additional DuoNeb as well as methylprednisolone and magnesium in the ED.   COPD with mild exacerbation ([place]) COVID-19 infection Patient started on Paxlovid from the ED Continue bronchodilators Antitussives and supportive care Airborne precautions Leucocytosis suspected reactive due to steroids. No fever. CXR no PNA Overall sats >92% on RA   Tobacco dependence due to cigarettes Nicotine patch    Hypertension Not currently on BP meds BP stable during hospital stay   Coronary artery calcification seen on CT scan Continue aspirin  Pt not taking statins per med rec--defer to PCP  Hemodynamically stable. Overall patient feels better. Will discharge to home with outpatient follow-up pulmonary and PCP.      Disposition: Home Diet recommendation:  Discharge Diet Orders (From admission, onward)     Start     Ordered   08/03/22 0000  Diet - low sodium heart healthy        08/03/22 1136           Cardiac diet DISCHARGE MEDICATION: Allergies as of 08/03/2022        Reactions   Atorvastatin Other (See Comments)   Myalgias   Codeine Nausea And Vomiting   Levofloxacin Nausea And Vomiting   Sulfonamide Derivatives Hives   Penicillins Hives, Rash   Sulfa Antibiotics Hives, Rash        Medication List     STOP taking these medications    nystatin 100000 UNIT/ML suspension Commonly known as: MYCOSTATIN   oseltamivir 75 MG capsule Commonly known as: TAMIFLU   rosuvastatin 40 MG tablet Commonly known as: CRESTOR       TAKE these medications    albuterol 108 (90 Base) MCG/ACT inhaler Commonly known as: VENTOLIN HFA Inhale 2-4 puffs into the lungs every 4 (four) hours as needed for wheezing or shortness of breath.   aspirin EC 81 MG tablet Take 1 tablet (81 mg total) by mouth daily. Swallow whole.   Fingertip Pulse Oximeter Misc 1 Device by Does not apply route daily.   guaiFENesin-dextromethorphan 100-10 MG/5ML syrup Commonly known as: ROBITUSSIN DM Take 10 mLs by mouth every 4 (four) hours as needed for cough.   ipratropium-albuterol 0.5-2.5 (3) MG/3ML Soln Commonly known as: DUONEB Take 3 mLs by nebulization every 6 (six) hours as needed.   nicotine 21 mg/24hr patch Commonly known as: NICODERM CQ - dosed in mg/24 hours Place 1 patch (21 mg total) onto the skin daily. Start taking on: August 04, 2022   nirmatrelvir/ritonavir (renal dosing) 10 x 150 MG & 10 x 100MG  Tabs Commonly known as: PAXLOVID Take 2 tablets by mouth  2 (two) times daily for 5 days. Patient GFR is  Take nirmatrelvir (150 mg) one tablet twice daily for 5 days and ritonavir (100 mg) one tablet twice daily for 5 days.   predniSONE 20 MG tablet Commonly known as: DELTASONE Take 1 tablet (20 mg total) by mouth daily with breakfast for 3 days. Start taking on: August 04, 2022 What changed:  how much to take when to take this   sodium chloride 0.65 % Soln nasal spray Commonly known as: OCEAN Place 1 spray into both nostrils as needed for congestion (nose  irritation).   umeclidinium-vilanterol 62.5-25 MCG/ACT Aepb Commonly known as: ANORO ELLIPTA Inhale 1 puff into the lungs daily.        Follow-up Information     Gwyneth Sprout, FNP. Schedule an appointment as soon as possible for a visit in 1 week(s).   Specialty: Family Medicine Contact information: [address] [place] [address] [phone]                 Condition at discharge: fair  The results of significant diagnostics from this hospitalization (including imaging, microbiology, ancillary and laboratory) are listed below for reference.   Imaging Studies: DG Chest 2 View  Result Date: 08/02/2022 CLINICAL DATA:  Shortness of breath EXAM: CHEST - 2 VIEW COMPARISON:  07/23/2022 FINDINGS: There is hyperinflation of the lungs compatible with COPD. Biapical and bibasilar scarring. Heart is borderline in size. Mediastinal contours within normal limits. Aortic atherosclerosis. No acute confluent opacities or effusions. No acute bony abnormality. IMPRESSION: COPD/chronic changes.  No active disease. Electronically Signed   By: Rolm Baptise M.D.   On: 08/02/2022 03:24   DG Chest 2 View  Result Date: 07/23/2022 CLINICAL DATA:  Dyspnea. EXAM: CHEST - 2 VIEW COMPARISON:  July 22, 2022. FINDINGS: The heart size and mediastinal contours are within normal limits. Both lungs are clear. Hyperexpansion of the lungs is noted. The visualized skeletal structures are unremarkable. IMPRESSION: No active cardiopulmonary disease.  Hyperexpansion of the lungs. Electronically Signed   By: Marijo Conception M.D.   On: 07/23/2022 16:34   DG Chest 2 View  Result Date: 07/22/2022 CLINICAL DATA:  Shortness of breath. EXAM: CHEST - 2 VIEW COMPARISON:  Chest radiograph dated 07/12/2022. FINDINGS: Bibasilar scarring. There is background of emphysema and chronic interstitial coarsening. No focal consolidation, pleural effusion, or pneumothorax. The cardiac silhouette is within normal limits.  Atherosclerotic calcification of the aortic arch. Osteopenia. No acute osseous pathology. IMPRESSION: 1. No active cardiopulmonary disease. 2. Emphysema. Electronically Signed   By: Anner Crete M.D.   On: 07/22/2022 02:25   DG Chest 2 View  Result Date: 07/12/2022 CLINICAL DATA:  Cough.  Shortness of breath. EXAM: CHEST - 2 VIEW COMPARISON:  Chest x-ray January 13, 2022 and CT scan of the chest Nov 05, 2021 FINDINGS: Emphysematous changes are identified in the lungs. Opacities in the apices, right greater than left, correlate with pleuroparenchymal scarring and changes identified on previous CT imaging. Mild opacity in left base. No other changes in the lungs. No pneumothorax. The cardiomediastinal silhouette is stable. IMPRESSION: 1. Emphysematous changes. 2. Mild opacity in left base favored to represent atelectasis. Developing infiltrate considered less likely. Recommend short-term follow-up imaging to ensure resolution. 3. Chronic changes in the apices. Electronically Signed   By: Dorise Bullion III M.D.   On: 07/12/2022 10:39    Microbiology: Results for orders placed or performed during the hospital encounter of 08/02/22  Resp panel by RT-PCR (  RSV, Flu A&B, Covid) Anterior Nasal Swab     Status: Abnormal   Collection Time: 08/02/22  2:10 AM   Specimen: Anterior Nasal Swab  Result Value Ref Range Status   SARS Coronavirus 2 by RT PCR POSITIVE (A) NEGATIVE Final    Comment: (NOTE) SARS-CoV-2 target nucleic acids are DETECTED.  The SARS-CoV-2 RNA is generally detectable in upper respiratory specimens during the acute phase of infection. Positive results are indicative of the presence of the identified virus, but do not rule out bacterial infection or co-infection with other pathogens not detected by the test. Clinical correlation with patient history and other diagnostic information is necessary to determine patient infection status. The expected result is Negative.  Fact Sheet for  Patients: BloggerCourse.com  Fact Sheet for Healthcare Providers: SeriousBroker.it  This test is not yet approved or cleared by the [place] FDA and  has been authorized for detection and/or diagnosis of SARS-CoV-2 by FDA under an Emergency Use Authorization (EUA).  This EUA will remain in effect (meaning this test can be used) for the duration of  the COVID-19 declaration under Section 564(b)(1) of the A ct, 21 U.S.C. section 360bbb-3(b)(1), unless the authorization is terminated or revoked sooner.     Influenza A by PCR NEGATIVE NEGATIVE Final   Influenza B by PCR NEGATIVE NEGATIVE Final    Comment: (NOTE) The Xpert Xpress SARS-CoV-2/FLU/RSV plus assay is intended as an aid in the diagnosis of influenza from Nasopharyngeal swab specimens and should not be used as a sole basis for treatment. Nasal washings and aspirates are unacceptable for Xpert Xpress SARS-CoV-2/FLU/RSV testing.  Fact Sheet for Patients: BloggerCourse.com  Fact Sheet for Healthcare Providers: SeriousBroker.it  This test is not yet approved or cleared by the [place] FDA and has been authorized for detection and/or diagnosis of SARS-CoV-2 by FDA under an Emergency Use Authorization (EUA). This EUA will remain in effect (meaning this test can be used) for the duration of the COVID-19 declaration under Section 564(b)(1) of the Act, 21 U.S.C. section 360bbb-3(b)(1), unless the authorization is terminated or revoked.     Resp Syncytial Virus by PCR NEGATIVE NEGATIVE Final    Comment: (NOTE) Fact Sheet for Patients: BloggerCourse.com  Fact Sheet for Healthcare Providers: SeriousBroker.it  This test is not yet approved or cleared by the [place] FDA and has been authorized for detection and/or diagnosis of SARS-CoV-2 by FDA under an Emergency Use  Authorization (EUA). This EUA will remain in effect (meaning this test can be used) for the duration of the COVID-19 declaration under Section 564(b)(1) of the Act, 21 U.S.C. section 360bbb-3(b)(1), unless the authorization is terminated or revoked.  Performed at [facility], [address] [place], [place] [address]     Labs: CBC: Recent Labs  Lab 08/02/22 0210 08/03/22 0352  WBC 8.8 20.8*  NEUTROABS  --  19.1*  HGB 12.3 11.5*  HCT 36.9 33.4*  MCV 95.3 93.6  PLT 254 244   Basic Metabolic Panel: Recent Labs  Lab 08/02/22 0210 08/03/22 0352  NA 136 138  K 3.6 3.7  CL 104 106  CO2 21* 22  GLUCOSE 132* 178*  BUN 24* 19  CREATININE 1.16* 0.67  CALCIUM 8.7* 8.8*  MG  --  2.2   Liver Function Tests: Recent Labs  Lab 08/03/22 0352  AST 28  ALT 18  ALKPHOS 58  BILITOT 0.9  PROT 6.0*  ALBUMIN 3.2*   CBG: No results for input(s): "GLUCAP" in the last 168  hours.  Discharge time spent: greater than 30 minutes.  Signed: Fritzi Mandes, MD [facility] 08/03/2022

## 2022-08-03 NOTE — Progress Notes (Signed)
Nutrition Brief Note  RD consulted for assessment of nutritional requirements/ status.   Wt Readings from Last 15 Encounters:  07/26/22 44.5 kg  07/23/22 45.1 kg  07/11/22 45.2 kg  06/03/22 47.4 kg  04/11/22 45.8 kg  04/04/22 45.6 kg  02/17/22 46.3 kg  01/25/22 46.4 kg  12/01/21 47.4 kg  11/05/21 45.8 kg  08/25/21 47.9 kg  06/02/21 46.4 kg  04/30/21 45.6 kg  02/15/21 44.9 kg  02/01/21 47.6 kg   Pt with medical history significant for COPD, tobacco use disorder, HTN, HLD and nonobstructive CAD, treated for the flu a couple months prior who presents with a 1 week history of cough and shortness of breath not responding to home albuterol treatment   Pt admitted with pneumonia and COVID-19.   Spoke with pt at bedside, who was pleasant and in good spirits today. She reports feeling better today. Pt shares that she has a decreased appetite over the past few days due to taste changes, which she attributes ot thrush and side effects from inhalers. Per pt, she grazes throughout the days and has not seen much changes in her appetite PTA. She denies any weight loss.   Pt has active discharge orders and is eager to go home.   Nutrition-Focused physical exam completed. Findings are no fat depletion, mild muscle depletion, and no edema.    Labs reviewed.   Current diet order is Heart Healthy, patient is consuming approximately n/a% of meals at this time. Labs and medications reviewed.   No nutrition interventions warranted at this time. If nutrition issues arise, please consult RD.   Loistine Chance, RD, LDN, [place] Registered Dietitian II Certified Diabetes Care and Education Specialist Please refer to [facility] for RD and/or RD on-call/weekend/after hours pager

## 2022-08-03 NOTE — Discharge Instructions (Addendum)
Patient advised smoking cessation use inhalers as instructed patient advised to follow-up pulmonology on her appointment

## 2022-08-03 NOTE — Care Management CC44 (Signed)
Condition Code 44 Documentation Completed  Patient Details  Name: Sierra Brown MRN: [id number] Date of Birth: 03-17-1951   Condition Code 44 given:  Yes Patient signature on Condition Code 44 notice:  Yes Documentation of 2 MD's agreement:  Yes Code 44 added to claim:  Yes    Gerilyn Pilgrim, [place] 08/03/2022, 12:08 PM

## 2022-08-05 ENCOUNTER — Institutional Professional Consult (permissible substitution): Payer: Medicare HMO | Admitting: Internal Medicine

## 2022-08-08 ENCOUNTER — Ambulatory Visit (INDEPENDENT_AMBULATORY_CARE_PROVIDER_SITE_OTHER): Payer: Medicare HMO | Admitting: Family Medicine

## 2022-08-08 ENCOUNTER — Encounter: Payer: Self-pay | Admitting: Family Medicine

## 2022-08-08 VITALS — BP 134/56 | HR 86 | Temp 98.5°F | Ht 64.5 in | Wt 99.3 lb

## 2022-08-08 DIAGNOSIS — R0602 Shortness of breath: Secondary | ICD-10-CM

## 2022-08-08 DIAGNOSIS — Z09 Encounter for follow-up examination after completed treatment for conditions other than malignant neoplasm: Secondary | ICD-10-CM | POA: Diagnosis not present

## 2022-08-08 NOTE — Assessment & Plan Note (Signed)
COVID/COPD flare Seen in hospital Doing better Has pulm appt Has home pulse ox Encouraged proning if winded Samples of Trelegy provided d/t cost concerns with Anoro Continue nebs PRN

## 2022-08-08 NOTE — Progress Notes (Signed)
Established patient visit   Patient: Sierra Brown   DOB: September 05, 1950   72 y.o. Female  MRN: [id number] Visit Date: 08/08/2022  Today's healthcare provider: Gwyneth Sprout, FNP   Subjective    HPI   Medications: Outpatient Medications Prior to Visit  Medication Sig   albuterol (VENTOLIN HFA) 108 (90 Base) MCG/ACT inhaler Inhale 2-4 puffs into the lungs every 4 (four) hours as needed for wheezing or shortness of breath.   aspirin EC 81 MG tablet Take 1 tablet (81 mg total) by mouth daily. Swallow whole.   guaiFENesin-dextromethorphan (ROBITUSSIN DM) 100-10 MG/5ML syrup Take 10 mLs by mouth every 4 (four) hours as needed for cough.   ipratropium-albuterol (DUONEB) 0.5-2.5 (3) MG/3ML SOLN Take 3 mLs by nebulization every 6 (six) hours as needed.   Misc. Devices (FINGERTIP PULSE OXIMETER) MISC 1 Device by Does not apply route daily.   nicotine (NICODERM CQ - DOSED IN MG/24 HOURS) 21 mg/24hr patch Place 1 patch (21 mg total) onto the skin daily.   sodium chloride (OCEAN) 0.65 % SOLN nasal spray Place 1 spray into both nostrils as needed for congestion (nose irritation).   umeclidinium-vilanterol (ANORO ELLIPTA) 62.5-25 MCG/ACT AEPB Inhale 1 puff into the lungs daily.   [DISCONTINUED] nirmatrelvir/ritonavir, renal dosing, (PAXLOVID) 10 x 150 MG & 10 x 100MG TABS Take 2 tablets by mouth 2 (two) times daily for 5 days. Patient GFR is  Take nirmatrelvir (150 mg) one tablet twice daily for 5 days and ritonavir (100 mg) one tablet twice daily for 5 days. (Patient not taking: Reported on 08/08/2022)   No facility-administered medications prior to visit.    Review of Systems    Objective    BP (!) 134/56   Pulse 86   Temp 98.5 F (36.9 C)   Ht 5' 4.5" (1.638 m)   Wt 99 lb 4.8 oz (45 kg)   SpO2 100%   BMI 16.78 kg/m   Physical Exam Vitals and nursing note reviewed.  Constitutional:      General: She is not in acute distress.    Appearance: Normal appearance. She is  underweight. She is not ill-appearing, toxic-appearing or diaphoretic.  HENT:     Head: Normocephalic and atraumatic.  Cardiovascular:     Rate and Rhythm: Normal rate and regular rhythm.     Pulses: Normal pulses.     Heart sounds: Normal heart sounds. No murmur heard.    No friction rub. No gallop.  Pulmonary:     Effort: Pulmonary effort is normal. No respiratory distress.     Breath sounds: No stridor. Examination of the left-middle field reveals decreased breath sounds. Examination of the right-lower field reveals decreased breath sounds. Examination of the left-lower field reveals decreased breath sounds. Decreased breath sounds present. No wheezing, rhonchi or rales.  Chest:     Chest wall: No tenderness.  Abdominal:     General: Bowel sounds are normal.     Palpations: Abdomen is soft.  Musculoskeletal:        General: No swelling, tenderness, deformity or signs of injury. Normal range of motion.     Right lower leg: No edema.     Left lower leg: No edema.  Skin:    General: Skin is warm and dry.     Capillary Refill: Capillary refill takes less than 2 seconds.     Coloration: Skin is not jaundiced or pale.     Findings: No bruising, erythema, lesion or rash.  Neurological:     General: No focal deficit present.     Mental Status: She is alert and oriented to person, place, and time. Mental status is at baseline.     Cranial Nerves: No cranial nerve deficit.     Sensory: No sensory deficit.     Motor: No weakness.     Coordination: Coordination normal.  Psychiatric:        Mood and Affect: Mood normal.        Behavior: Behavior normal.        Thought Content: Thought content normal.        Judgment: Judgment normal.     No results found for any visits on 08/08/22.  Assessment & Plan     Problem List Items Addressed This Visit       Other   Hospital discharge follow-up    COVID/COPD flare Seen in hospital Doing better Has pulm appt Has home pulse  ox Encouraged proning if winded Samples of Trelegy provided d/t cost concerns with Anoro Continue nebs PRN       Shortness of breath - Primary    Acute, improving Finished paxlovid Continues to note some variability in breathing SPO2 >95% Continue to monitor       Relevant Orders   Basic Metabolic Panel (BMET)   CBC   Return if symptoms worsen or fail to improve.     Vonna Kotyk, FNP, have reviewed all documentation for this visit. The documentation on 08/08/22 for the exam, diagnosis, procedures, and orders are all accurate and complete.  Gwyneth Sprout, [place] [phone] (phone) [phone] (fax)  [place]

## 2022-08-08 NOTE — Assessment & Plan Note (Signed)
Acute, improving Finished paxlovid Continues to note some variability in breathing SPO2 >95% Continue to monitor

## 2022-08-09 LAB — BASIC METABOLIC PANEL
BUN/Creatinine Ratio: 21 (ref 12–28)
BUN: 16 mg/dL (ref 8–27)
CO2: 24 mmol/L (ref 20–29)
Calcium: 9.4 mg/dL (ref 8.7–10.3)
Chloride: 104 mmol/L (ref 96–106)
Creatinine, Ser: 0.76 mg/dL (ref 0.57–1.00)
Glucose: 68 mg/dL — ABNORMAL LOW (ref 70–99)
Potassium: 4.4 mmol/L (ref 3.5–5.2)
Sodium: 142 mmol/L (ref 134–144)
eGFR: 85 mL/min/{1.73_m2} (ref >59)

## 2022-08-09 LAB — CBC
Hematocrit: 39.2 % (ref 34.0–46.6)
Hemoglobin: 13.4 g/dL (ref 11.1–15.9)
MCH: 32.4 pg (ref 26.6–33.0)
MCHC: 34.2 g/dL (ref 31.5–35.7)
MCV: 95 fL (ref 79–97)
Platelets: 280 10*3/uL (ref 150–450)
RBC: 4.14 x10E6/uL (ref 3.77–5.28)
RDW: 12.7 % (ref 11.7–15.4)
WBC: 11.9 10*3/uL — ABNORMAL HIGH (ref 3.4–10.8)

## 2022-08-09 NOTE — Progress Notes (Signed)
Labs are improving; this is reassuring.

## 2022-09-02 ENCOUNTER — Ambulatory Visit: Payer: Medicare HMO | Admitting: Internal Medicine

## 2022-09-02 ENCOUNTER — Encounter: Payer: Self-pay | Admitting: Internal Medicine

## 2022-09-02 VITALS — BP 124/70 | HR 88 | Temp 97.3°F | Ht 64.5 in | Wt 101.6 lb

## 2022-09-02 DIAGNOSIS — F17210 Nicotine dependence, cigarettes, uncomplicated: Secondary | ICD-10-CM

## 2022-09-02 DIAGNOSIS — R0602 Shortness of breath: Secondary | ICD-10-CM

## 2022-09-02 DIAGNOSIS — R69 Illness, unspecified: Secondary | ICD-10-CM | POA: Diagnosis not present

## 2022-09-02 DIAGNOSIS — J449 Chronic obstructive pulmonary disease, unspecified: Secondary | ICD-10-CM

## 2022-09-02 NOTE — Assessment & Plan Note (Signed)
Counseled re importance of smoking cessation but did not meet time criteria for separate billing    I did review her CT showing emphysema, the structural equivalent of copd and the most irreversible of its components with the only way to stop progression = stop smoking          Each maintenance medication was reviewed in detail including emphasizing most importantly the difference between maintenance and prns and under what circumstances the prns are to be triggered using an action plan format where appropriate.  Total time for H and P, chart review, counseling, reviewing hfa/dpi/neb  device(s) and generating customized AVS unique to this office visit / same day charting = 45 min with pt new to me

## 2022-09-02 NOTE — Progress Notes (Addendum)
Sierra Brown, female    DOB: [birth year]   MRN: [id number]   Brief patient profile:  49  yowf  active smoker referred to pulmonary clinic in [facility]  09/02/2022 by Tally Joe  for copd dval        Admit date:     08/02/2022  Discharge date: 08/03/22    Discharge Diagnoses: COVID infection COPD exacerbation--mild   Hospital Course:  Sierra Brown is a 72 y.o. female with medical history significant for COPD, tobacco use disorder, HTN, HLD and nonobstructive CAD, treated for the flu a couple months prior who presents to the ED with a 1 week history of cough and shortness of breath not responding to home albuterol treatment.  She arrived by EMS and had increased work of breathing, tachypneic to 24 but not hypoxic.   Chest x-ray showed chronic COPD changes and no active disease. Patient was treated with an additional DuoNeb as well as methylprednisolone and magnesium in the ED.    COPD with mild exacerbation ([place]) COVID-19 infection Patient started on Paxlovid from the ED Continue bronchodilators Antitussives and supportive care Airborne precautions Leucocytosis suspected reactive due to steroids. No fever. CXR no PNA Overall sats >92% on RA   Tobacco dependence due to cigarettes Nicotine patch    Hypertension Not currently on BP meds BP stable during hospital stay   Coronary artery calcification seen on CT scan Continue aspirin  Pt not taking statins per med rec--defer to PCP   Hemodynamically stable. Overall patient feels better. Will discharge to home with outpatient follow-up pulmonary and PCP.    History of Present Illness  09/02/2022  Pulmonary/ 1st office eval/ Sierra Brown / [employer] on trelegy  Chief Complaint  Patient presents with   Consult    Started with the Flu before Christmas. Ended up at the ED for SOB. SOB with exertion. Cough at night with clear sputum.  No wheezing.   Dyspnea:  can go anywhere just slowly  Cough: rattling mucus clear esp in  am  Sleep: better prone level bed 2 pillows  SABA use: only uses in am before trelegy Covid 19 never vax   No obvious day to day or daytime pattern/variability or assoc excess/ purulent sputum or mucus plugs or hemoptysis or cp or chest tightness, subjective wheeze or overt sinus or hb symptoms.   Sleeping  without nocturnal  or early am exacerbation  of respiratory  c/o's or need for noct saba. Also denies any obvious fluctuation of symptoms with weather or environmental changes or other aggravating or alleviating factors except as outlined above   No unusual exposure hx or h/o childhood pna/ asthma or knowledge of premature birth.  Current Allergies, Complete Past Medical History, Past Surgical History, Family History, and Social History were reviewed in [employer] record.  ROS  The following are not active complaints unless bolded Hoarseness, sore throat, dysphagia, dental problems, itching, sneezing,  nasal congestion or discharge of excess mucus or purulent secretions, ear ache,   fever, chills, sweats, unintended wt loss or wt gain, classically pleuritic or exertional cp,  orthopnea pnd or arm/hand swelling  or leg swelling, presyncope, palpitations, abdominal pain, anorexia, nausea, vomiting, diarrhea  or change in bowel habits or change in bladder habits, change in stools or change in urine, dysuria, hematuria,  rash, arthralgias, visual complaints, headache, numbness, weakness or ataxia or problems with walking or coordination,  change in mood or  memory.  Past Medical History:  Diagnosis Date   Allergy    allerlgic rhinitis   Cataract    COPD (chronic obstructive pulmonary disease) ([place])    COVID    Pneumonia    Tobacco use disorder     Outpatient Medications Prior to Visit  Medication Sig Dispense Refill   albuterol (VENTOLIN HFA) 108 (90 Base) MCG/ACT inhaler Inhale 2-4 puffs into the lungs every 4 (four) hours as needed for wheezing or  shortness of breath. 1 each 0   aspirin EC 81 MG tablet Take 1 tablet (81 mg total) by mouth daily. Swallow whole. 90 tablet 3   Fluticasone-Umeclidin-Vilant (TRELEGY ELLIPTA) 100-62.5-25 MCG/ACT AEPB Inhale 1 puff into the lungs daily.     ipratropium-albuterol (DUONEB) 0.5-2.5 (3) MG/3ML SOLN Take 3 mLs by nebulization every 6 (six) hours as needed. 360 mL 11   Misc. Devices (FINGERTIP PULSE OXIMETER) MISC 1 Device by Does not apply route daily. 1 each 0   sodium chloride (OCEAN) 0.65 % SOLN nasal spray Place 1 spray into both nostrils as needed for congestion (nose irritation). 30 mL 0   guaiFENesin-dextromethorphan (ROBITUSSIN DM) 100-10 MG/5ML syrup Take 10 mLs by mouth every 4 (four) hours as needed for cough. (Patient not taking: Reported on 09/02/2022) 118 mL 0   nicotine (NICODERM CQ - DOSED IN MG/24 HOURS) 21 mg/24hr patch Place 1 patch (21 mg total) onto the skin daily. (Patient not taking: Reported on 09/02/2022) 28 patch 0       1   No facility-administered medications prior to visit.     Objective:     BP 124/70 (BP Location: Left Arm, Cuff Size: Normal)   Pulse 88   Temp (!) 97.3 F (36.3 C)   Ht 5' 4.5" (1.638 m)   Wt 101 lb 9.6 oz (46.1 kg)   SpO2 95%   BMI 17.17 kg/m   SpO2: 95 %  HEENT : Oropharynx  clear      NECK :  without  apparent JVD/ palpable Nodes/TM    LUNGS: no acc muscle use,  Mild barrel  contour chest wall with bilateral  Distant bs s audible wheeze and  without cough on insp or exp maneuvers  and mild  Hyperresonant  to  percussion bilaterally     CV:  RRR  no s3 or murmur or increase in P2, and no edema   ABD:  soft and nontender with pos end  insp Hoover's  in the supine position.  No bruits or organomegaly appreciated   MS:  Nl gait/ ext warm without deformities Or obvious joint restrictions  calf tenderness, cyanosis or clubbing     SKIN: warm and dry without lesions    NEURO:  alert, approp, nl sensorium with  no motor or cerebellar  deficits apparent.     I personally reviewed images and agree with radiology impression as follows:  CXR:   pa and lateral 08/02/22  COPD/chronic changes.  No active disease. My comment: very vertical chest /heartshadow   I personally reviewed images and agree with radiology impression as follows:   Chest LDSCT     11/05/21  Marked centrilobular emphysema with diffuse bronchial wall thickening.         Assessment   COPD GOLD ? Active smoking at initial pulmonary eval 09/02/2022  - 09/02/2022  After extensive coaching inhaler device,  effectiveness =    50% (short Ti) with hfa and 75% with dpi so continue trelegy 100 pending pfts  Group D (now reclassified as E) in terms of symptom/risk and laba/lama/ICS  therefore appropriate rx at this point >>>  trelegy and approp saba:  Re SABA :  I spent extra time with pt today reviewing appropriate use of albuterol for prn use on exertion with the following points: 1) saba is for relief of sob that does not improve by walking a slower pace or resting but rather if the pt does not improve after trying this first. 2) If the pt is convinced, as many are, that saba helps recover from activity faster then it's easy to tell if this is the case by re-challenging : ie stop, take the inhaler, then p 5 minutes try the exact same activity (intensity of workload) that just caused the symptoms and see if they are substantially diminished or not after saba 3) if there is an activity that reproducibly causes the symptoms, try the saba 15 min before the activity on alternate days   If in fact the saba really does help, then fine to continue to use it prn but advised may need to look closer at the maintenance regimen being used to achieve better control of airways disease with exertion.   Return with pfts in 6 weeks with all meds in hand using a trust but verify approach to confirm accurate Medication  Reconciliation The principal here is that until we are certain that  the  patients are doing what we've asked, it makes no sense to ask them to do more.   >>>  Add Needs alpha one at phenotype as well on return     Cigarette smoker Counseled re importance of smoking cessation but did not meet time criteria for separate billing    I did review her CT showing emphysema, the structural equivalent of copd and the most irreversible of its components with the only way to stop progression = stop smoking  Each maintenance medication was reviewed in detail including emphasizing most importantly the difference between maintenance and prns and under what circumstances the prns are to be triggered using an action plan format where appropriate.  Total time for H and P, chart review, counseling, reviewing hfa/dpi/neb  device(s) and generating customized AVS unique to this office visit / same day charting = 45 min with pt new to me        Christinia Gully, MD 09/02/2022

## 2022-09-02 NOTE — Assessment & Plan Note (Addendum)
Active smoking at initial pulmonary eval 09/02/2022  - 09/02/2022  After extensive coaching inhaler device,  effectiveness =    50% (short Ti) with hfa and 75% with dpi so continue trelegy 100 pending pfts    Group D (now reclassified as E) in terms of symptom/risk and laba/lama/ICS  therefore appropriate rx at this point >>>  trelegy and approp saba:  Re SABA :  I spent extra time with pt today reviewing appropriate use of albuterol for prn use on exertion with the following points: 1) saba is for relief of sob that does not improve by walking a slower pace or resting but rather if the pt does not improve after trying this first. 2) If the pt is convinced, as many are, that saba helps recover from activity faster then it's easy to tell if this is the case by re-challenging : ie stop, take the inhaler, then p 5 minutes try the exact same activity (intensity of workload) that just caused the symptoms and see if they are substantially diminished or not after saba 3) if there is an activity that reproducibly causes the symptoms, try the saba 15 min before the activity on alternate days   If in fact the saba really does help, then fine to continue to use it prn but advised may need to look closer at the maintenance regimen being used to achieve better control of airways disease with exertion.   Return with pfts in 6 weeks with all meds in hand using a trust but verify approach to confirm accurate Medication  Reconciliation The principal here is that until we are certain that the  patients are doing what we've asked, it makes no sense to ask them to do more.     Add Needs alpha one at phenotype as well on return

## 2022-09-02 NOTE — Patient Instructions (Addendum)
Plan A = Automatic = Always=    Trelegy [id number] one click in am   Plan B = Backup (to supplement plan A, not to replace it) Only use your albuterol inhaler as a rescue medication to be used if you can't catch your breath by resting or doing a relaxed purse lip breathing pattern.  - The less you use it, the better it will work when you need it. - Ok to use the inhaler up to 2 puffs  every 4 hours if you must but call for appointment if use goes up over your usual need - Don't leave home without it !!  (think of it like the spare tire for your car)   Plan C = Crisis (instead of Plan B but only if Plan B stops working) - only use your albuterol nebulizer if you first try Plan B and it fails to help > ok to use the nebulizer up to every 4 hours but if start needing it regularly call for immediate appointment   Also  Ok to try albuterol 15 min before an activity (on alternating days)  that you know would usually make you short of breath and see if it makes any difference and if makes none then don't take albuterol after activity unless you can't catch your breath as this means it's the resting that helps, not the albuterol.  Make sure you check your oxygen saturation at your highest level of activity(NOT after you stop)  to be sure it stays over 90% and keep track of it at least once a week, more often if breathing getting worse, and let me know if losing ground. (Collect the dots to connect the dots approach)        Please schedule a follow up office visit in 6 weeks, call sooner if needed with pfts on return   Add Needs alpha one at phenotype as well on return

## 2022-09-18 ENCOUNTER — Ambulatory Visit (INDEPENDENT_AMBULATORY_CARE_PROVIDER_SITE_OTHER): Payer: Medicare HMO

## 2022-09-18 ENCOUNTER — Ambulatory Visit
Admission: EM | Admit: 2022-09-18 | Discharge: 2022-09-18 | Disposition: A | Discharge status: Home / Self Care | Payer: Medicare HMO | Attending: Emergency Medicine | Admitting: Emergency Medicine

## 2022-09-18 DIAGNOSIS — J069 Acute upper respiratory infection, unspecified: Secondary | ICD-10-CM | POA: Insufficient documentation

## 2022-09-18 DIAGNOSIS — J449 Chronic obstructive pulmonary disease, unspecified: Secondary | ICD-10-CM | POA: Diagnosis not present

## 2022-09-18 DIAGNOSIS — R509 Fever, unspecified: Secondary | ICD-10-CM | POA: Diagnosis not present

## 2022-09-18 DIAGNOSIS — R059 Cough, unspecified: Secondary | ICD-10-CM | POA: Diagnosis not present

## 2022-09-18 DIAGNOSIS — Z1152 Encounter for screening for COVID-19: Secondary | ICD-10-CM | POA: Diagnosis not present

## 2022-09-18 LAB — POCT INFLUENZA A/B
Influenza A, POC: NEGATIVE
Influenza B, POC: NEGATIVE

## 2022-09-18 NOTE — ED Triage Notes (Signed)
Patient to Urgent Care with complaints of fevers that started last night. Max temp 101, treated with tylenol.  Expresses concerns due to COPD, and worries she has pneumonia. Reports discomfort and aching in her back and left shoulder. Reports last night have body and muscle aches.   Using her Trilegy inhaler. Denies any new or worsening cough. Has been visiting her elderly mother who is currently in the Emergency Room.

## 2022-09-18 NOTE — Discharge Instructions (Addendum)
Your flu test is negative.  Your COVID test is pending.    Take Tylenol as needed for fever or discomfort.  Rest and keep yourself hydrated.    Follow-up with your primary care provider tomorrow.

## 2022-09-18 NOTE — ED Provider Notes (Signed)
Sierra Brown    CSN: [id number] Arrival date & time: 09/18/22  1013      History   Chief Complaint Chief Complaint  Patient presents with   Fever    HPI Sierra Brown is a 72 y.o. female.  Patient presents with fever and body aches since yesterday.  Tmax 101.  Treating with Tylenol.  Her cough is at her baseline.  She denies ear pain, sore throat, chest pain, unusual shortness of breath, or other symptoms.  Patient had pneumonia in January and is concerned for this again.  Patient was hospitalized on 08/02/2022 for COVID and COPD exacerbation.  Her medical history includes COPD, allergies, hypertension.  Current everyday smoker.   The history is provided by the patient and medical records.    Past Medical History:  Diagnosis Date   Allergy    allerlgic rhinitis   Cataract    COPD (chronic obstructive pulmonary disease) ([place])    COVID    Pneumonia    Tobacco use disorder     Patient Active Problem List   Diagnosis Date Noted   COPD GOLD ? 09/02/2022   Hospital discharge follow-up 08/08/2022   Acute bronchitis 08/03/2022   COVID-19 virus infection 08/02/2022   COPD exacerbation ([place]) 07/26/2022   Shortness of breath 06/03/2022   Impacted cerumen of both ears 04/13/2022   History of TIA (transient ischemic attack) 01/25/2022   Primary hypertension 12/01/2021   Cigarette smoker 12/01/2021   Age-related osteoporosis without current pathological fracture 12/01/2021   Mildly underweight adult 12/01/2021   Encounter for screening for lung cancer 08/25/2021   Encounter for screening for osteoporosis 08/25/2021   Colon cancer screening 08/25/2021   Screening mammogram for breast cancer 08/25/2021   Underweight 08/25/2021   Encounter for lipid screening for cardiovascular disease 08/25/2021   Need for vaccination against Streptococcus pneumoniae 08/25/2021   Annual physical exam 08/25/2021   History of hypertension 08/25/2021   Aortic atherosclerosis ([place])  08/25/2021   Hypertension 01/20/2021   Coronary artery calcification seen on CT scan 09/16/2020   History of palpitations in adulthood 08/05/2020   Tobacco abuse 05/25/2007   Seasonal allergic rhinitis 10/24/2006    Past Surgical History:  Procedure Laterality Date   CESAREAN SECTION     TONSILLECTOMY      OB History   No obstetric history on file.      Home Medications    Prior to Admission medications   Medication Sig Start Date End Date Taking? Authorizing Provider  albuterol (VENTOLIN HFA) 108 (90 Base) MCG/ACT inhaler Inhale 2-4 puffs into the lungs every 4 (four) hours as needed for wheezing or shortness of breath. 07/22/22   Ward, Delice Bison, DO  aspirin EC 81 MG tablet Take 1 tablet (81 mg total) by mouth daily. Swallow whole. 01/25/22   Gwyneth Sprout, FNP  Fluticasone-Umeclidin-Vilant (TRELEGY ELLIPTA) 100-62.5-25 MCG/ACT AEPB Inhale 1 puff into the lungs daily.    [provider]  ipratropium-albuterol (DUONEB) 0.5-2.5 (3) MG/3ML SOLN Take 3 mLs by nebulization every 6 (six) hours as needed. 07/26/22   Gwyneth Sprout, FNP  Misc. Devices (FINGERTIP PULSE OXIMETER) MISC 1 Device by Does not apply route daily. 07/23/22   Rada Hay, MD  sodium chloride (OCEAN) 0.65 % SOLN nasal spray Place 1 spray into both nostrils as needed for congestion (nose irritation). 08/03/22   Fritzi Mandes, MD    Family History Family History  Problem Relation Age of Onset   Ulcerative colitis Mother  colostomy   Hypertension Mother    Heart Problems Mother    Breast cancer Neg Hx     Social History Social History   Tobacco Use   Smoking status: Every Day    Packs/day: 0.75    Years: 49.00    Additional pack years: 0.00    Total pack years: 36.75    Types: Cigarettes   Smokeless tobacco: Never   Tobacco comments:    1-2 cigarettes daily- 09/02/2022  Vaping Use   Vaping Use: Never used  Substance Use Topics   Alcohol use: Never   Drug use: Never      Allergies   Atorvastatin, Codeine, Levofloxacin, Sulfonamide derivatives, Penicillins, and Sulfa antibiotics   Review of Systems Review of Systems  Constitutional:  Positive for chills and fever.  HENT:  Negative for ear pain and sore throat.   Respiratory:  Positive for cough. Negative for shortness of breath.   Cardiovascular:  Negative for chest pain and palpitations.  Gastrointestinal:  Negative for diarrhea and vomiting.  Skin:  Negative for rash.  All other systems reviewed and are negative.    Physical Exam Triage Vital Signs ED Triage Vitals  Enc Vitals Group     BP 09/18/22 1058 126/64     Pulse Rate 09/18/22 1055 92     Resp 09/18/22 1055 18     Temp 09/18/22 1055 98.7 F (37.1 C)     Temp src --      SpO2 09/18/22 1055 97 %     Weight --      Height --      Head Circumference --      Peak Flow --      Pain Score 09/18/22 1051 3     Pain Loc --      Pain Edu? --      Excl. in [place]? --    No data found.  Updated Vital Signs BP 126/64   Pulse 92   Temp 98.7 F (37.1 C)   Resp 18   SpO2 97%   Visual Acuity Right Eye Distance:   Left Eye Distance:   Bilateral Distance:    Right Eye Near:   Left Eye Near:    Bilateral Near:     Physical Exam Vitals and nursing note reviewed.  Constitutional:      General: She is not in acute distress.    Appearance: She is well-developed. She is not ill-appearing.  HENT:     Right Ear: Tympanic membrane normal.     Left Ear: Tympanic membrane normal.     Nose: Nose normal.     Mouth/Throat:     Mouth: Mucous membranes are moist.     Pharynx: Oropharynx is clear.  Cardiovascular:     Rate and Rhythm: Normal rate and regular rhythm.     Heart sounds: Normal heart sounds.  Pulmonary:     Effort: Pulmonary effort is normal. No respiratory distress.     Breath sounds: Normal breath sounds.  Musculoskeletal:     Cervical back: Neck supple.  Skin:    General: Skin is warm and dry.  Neurological:      Mental Status: She is alert.  Psychiatric:        Mood and Affect: Mood normal.        Behavior: Behavior normal.      UC Treatments / Results  Labs (all labs ordered are listed, but only abnormal results are displayed) Labs Reviewed  SARS CORONAVIRUS  2 (TAT 6-24 HRS)  POCT INFLUENZA A/B    EKG   Radiology DG Chest 2 View  Result Date: 09/18/2022 CLINICAL DATA:  Cough, COPD, fever EXAM: CHEST - 2 VIEW COMPARISON:  08/02/2022 FINDINGS: Hyperinflated lungs and emphysema, compatible with patient's history of COPD. Biapical and bibasilar scarring. Cardiac size is just above the upper limit of normal. Normal mediastinal contours. No focal pulmonary opacity. No pleural effusion or pneumothorax. IMPRESSION: No acute cardiopulmonary process. Electronically Signed   By: Merilyn Baba M.D.   On: 09/18/2022 11:40    Procedures Procedures (including critical care time)  Medications Ordered in UC Medications - No data to display  Initial Impression / Assessment and Plan / UC Course  I have reviewed the triage vital signs and the nursing notes.  Pertinent labs & imaging results that were available during my care of the patient were reviewed by me and considered in my medical decision making (see chart for details).    Fever, viral illness.  No respiratory distress.  Patient reports her cough and shortness of breath are at baseline.  Afebrile, VSS.  O2 sat 97% on room air.  CXR negative.  Rapid flu negative.  COVID pending.  If COVID positive, recommend treatment with Paxlovid.  Last GFR 85 on 08/08/2022.  Discussed symptomatic treatment including Tylenol, rest, hydration.  Instructed patient to follow up with her PCP tomorrow.  ED precautions given.  She agrees to plan of care.   Final Clinical Impressions(s) / UC Diagnoses   Final diagnoses:  Fever, unspecified  Viral URI     Discharge Instructions      Your flu test is negative.  Your COVID test is pending.    Take Tylenol as  needed for fever or discomfort.  Rest and keep yourself hydrated.    Follow-up with your primary care provider tomorrow.         ED Prescriptions   None    PDMP not reviewed this encounter.   Sharion Balloon, NP 09/18/22 [id number]

## 2022-09-19 LAB — SARS CORONAVIRUS 2 (TAT 6-24 HRS): SARS Coronavirus 2: NEGATIVE

## 2022-09-26 ENCOUNTER — Encounter: Payer: Self-pay | Admitting: Physician Assistant

## 2022-09-26 ENCOUNTER — Ambulatory Visit (INDEPENDENT_AMBULATORY_CARE_PROVIDER_SITE_OTHER): Payer: Medicare HMO | Admitting: Physician Assistant

## 2022-09-26 VITALS — BP 102/48 | HR 79 | Temp 98.2°F | Wt 97.0 lb

## 2022-09-26 DIAGNOSIS — J019 Acute sinusitis, unspecified: Secondary | ICD-10-CM

## 2022-09-26 MED ORDER — AZITHROMYCIN 250 MG PO TABS: ORAL_TABLET | ORAL | 0 refills | Status: AC

## 2022-09-26 NOTE — Progress Notes (Signed)
Argentina Ponder [place],acting as a Education administrator for [employer], PA-C.,have documented all relevant documentation on the behalf of Mardene Speak, PA-C,as directed by  [employer], PA-C while in the presence of [employer], PA-C.    Argentina Ponder De[place],acting as a Education administrator for [employer], PA-C.,have documented all relevant documentation on the behalf of Mardene Speak, PA-C,as directed by  [employer], PA-C while in the presence of [employer], PA-C.    Established patient visit   Patient: Sierra Brown   DOB: [birth year]   72 y.o. Female  MRN: [id number] Visit Date: 09/26/2022  Today's healthcare provider: Mardene Speak, PA-C  CC: sinus symptoms  Subjective    HPI  Patient is a 72 year old female who presents for evaluation of fever, congestion and headache.  She states that her symptoms began 8 days ago.  She was seen at a walk in clinic on that day and was tested for flu, covid and had chest x-ray.  She states she was told it was viral.  Patient reports continued low grade fever and come morning cough.    Medications: Outpatient Medications Prior to Visit  Medication Sig   albuterol (VENTOLIN HFA) 108 (90 Base) MCG/ACT inhaler Inhale 2-4 puffs into the lungs every 4 (four) hours as needed for wheezing or shortness of breath.   aspirin EC 81 MG tablet Take 1 tablet (81 mg total) by mouth daily. Swallow whole.   Fluticasone-Umeclidin-Vilant (TRELEGY ELLIPTA) 100-62.5-25 MCG/ACT AEPB Inhale 1 puff into the lungs daily.   ipratropium-albuterol (DUONEB) 0.5-2.5 (3) MG/3ML SOLN Take 3 mLs by nebulization every 6 (six) hours as needed.   Misc. Devices (FINGERTIP PULSE OXIMETER) MISC 1 Device by Does not apply route daily.   sodium chloride (OCEAN) 0.65 % SOLN nasal spray Place 1 spray into both nostrils as needed for congestion (nose irritation).   No facility-administered medications prior to visit.    Review of Systems  Constitutional:  Positive for appetite change, chills,  diaphoresis, fatigue and fever.  HENT:  Positive for congestion, ear pain (full), sinus pressure and sinus pain. Negative for ear discharge, facial swelling, nosebleeds, postnasal drip, rhinorrhea, sneezing, sore throat and tinnitus.   Respiratory:  Positive for cough and shortness of breath. Negative for wheezing.   Cardiovascular:  Negative for chest pain.  Gastrointestinal:  Negative for abdominal pain, constipation, diarrhea, nausea and vomiting.  Musculoskeletal:  Positive for myalgias.  Neurological:  Positive for headaches. Negative for dizziness and light-headedness.       Objective    BP (!) 102/48 (BP Location: Left Arm, Patient Position: Sitting, Cuff Size: Normal)   Pulse 79   Temp 98.2 F (36.8 C) (Oral)   Wt 97 lb (44 kg)   SpO2 99%   BMI 16.39 kg/m    Physical Exam Vitals reviewed.  Constitutional:      General: She is in acute distress.     Appearance: Normal appearance. She is well-developed. She is not diaphoretic.  HENT:     Head: Normocephalic and atraumatic.     Right Ear: There is impacted cerumen.     Left Ear: Tympanic membrane, ear canal and external ear normal.     Nose: Congestion and rhinorrhea present.     Mouth/Throat:     Pharynx: Posterior oropharyngeal erythema (mild) present.     Comments: Postnasal drainage present Eyes:     General: No scleral icterus.       Right eye: No discharge.  Left eye: No discharge.     Extraocular Movements: Extraocular movements intact.     Conjunctiva/sclera: Conjunctivae normal.     Pupils: Pupils are equal, round, and reactive to light.  Neck:     Thyroid: No thyromegaly.  Cardiovascular:     Rate and Rhythm: Normal rate and regular rhythm.     Pulses: Normal pulses.     Heart sounds: Normal heart sounds. No murmur heard. Pulmonary:     Effort: Pulmonary effort is normal. No respiratory distress.     Breath sounds: Normal breath sounds. No wheezing, rhonchi or rales.  Musculoskeletal:         General: Normal range of motion.     Cervical back: Normal range of motion and neck supple. No tenderness.     Right lower leg: No edema.     Left lower leg: No edema.  Lymphadenopathy:     Cervical: Cervical adenopathy present.  Skin:    General: Skin is warm and dry.     Findings: No rash.  Neurological:     Mental Status: She is alert and oriented to person, place, and time. Mental status is at baseline.  Psychiatric:        Behavior: Behavior normal.        Thought Content: Thought content normal.        Judgment: Judgment normal.      No results found for any visits on 09/26/22.  Assessment & Plan     Acute rhinosinusitis - Plan: azithromycin (ZITHROMAX) 250 MG tablet Sinusitis  symptoms and exam c/w sinusitis   - no evidence of AOM, CAP, strep pharyngitis, or other infection - given duration of symptoms, suspect viral etiology - will hold antibiotics. However, I will place an order for Zpack and send to pharmacy after receiving results of the labs. According to studies, prescribing antibiotics and instructing patients not to fill it unless their symptoms worsen or do not improve after several days can help to reduce antibiotic use  - discussed the potential negative side effects of antibiotics and that they can lead to resistance if used unnecessarily; discussed expectations for duration of symptoms. Discussed that they may feel bad for up to a week, but sometimes symptoms last longer. - explained  treatment options that will help with symptoms : symptomatic management (flonase, decongestants, etc) - discussed concerning symptoms like high fever, SOB, facial pain. The patient was advised to call back or seek an in-person evaluation if the symptoms worsen or if the condition fails to improve as anticipated/ next Monday? In the setting of COPD, needs to continue her current treatment for COPD Pt does not smoke  Advised to drink a lot of fluids, nasal saline spray, flonase once  daily. I discussed the assessment and treatment plan with the patient. The patient was provided an opportunity to ask questions and all were answered. The patient agreed with the plan and demonstrated an understanding of the instructions.  I, Mardene Speak, PA-C have reviewed all documentation for this visit. The documentation on 09/26/22  for the exam, diagnosis, procedures, and orders are all accurate and complete.  Mardene Speak, [facility], [place] [phone] (phone) [phone] (fax)   [place]

## 2022-09-28 ENCOUNTER — Other Ambulatory Visit: Payer: Self-pay | Admitting: Family Medicine

## 2022-09-28 MED ORDER — TRELEGY ELLIPTA 100-62.5-25 MCG/ACT IN AEPB: 1.0000 | INHALATION_SPRAY | Freq: Every day | RESPIRATORY_TRACT | 11 refills | Status: DC

## 2022-09-28 NOTE — Telephone Encounter (Signed)
Requested medication (s) are due for refill today:For review  Requested medication (s) are on the active medication list: yes    Last refill: 09/02/22  Amount not specified  Future visit scheduled yes 10/14/22  Notes to clinic:Historical provider, please review. Thank you.  Requested Prescriptions  Pending Prescriptions Disp Refills   Fluticasone-Umeclidin-Vilant (TRELEGY ELLIPTA) 100-62.5-25 MCG/ACT AEPB      Sig: Inhale 1 puff into the lungs daily.     Off-Protocol Failed - 09/28/2022 10:44 AM      Failed - Medication not assigned to a protocol, review manually.      Passed - Valid encounter within last 12 months    Recent Outpatient Visits           2 days ago Acute rhinosinusitis   [place], [place], PA-C   1 month ago Shortness of breath   [facility] Tally Joe T, FNP   2 months ago Chronic obstructive pulmonary disease with acute exacerbation [place])   [place] Gwyneth Sprout, FNP   2 months ago Shortness of breath   [place], [place], PA-C   3 months ago Aortic atherosclerosis [facility])   [place] Gwyneth Sprout, FNP       Future Appointments             In 2 weeks Gwyneth Sprout, [place], [place]   In 2 weeks Melvyn Novas, Christena Deem, MD [facility] [place] [facility]

## 2022-09-28 NOTE — Telephone Encounter (Signed)
Pt stated she usually gets samples but would like to request that a Rx be sent to her pharmacy  Medication Refill - Medication: Fluticasone-Umeclidin-Vilant (TRELEGY ELLIPTA) 100-62.5-25 MCG/ACT AEPB   Has the patient contacted their pharmacy? No.  Preferred Pharmacy (with phone number or street name):  [facility] [place] [place] - [address] [place] Phone: [phone]  Fax: [phone]     Has the patient been seen for an appointment in the last year OR does the patient have an upcoming appointment? Yes.    Agent: Please be advised that RX refills may take up to 3 business days. We ask that you follow-up with your pharmacy.

## 2022-09-29 ENCOUNTER — Telehealth: Payer: Self-pay | Admitting: Family Medicine

## 2022-10-06 ENCOUNTER — Ambulatory Visit: Payer: Medicare HMO | Attending: Internal Medicine

## 2022-10-06 DIAGNOSIS — R0602 Shortness of breath: Secondary | ICD-10-CM

## 2022-10-06 MED ORDER — ALBUTEROL SULFATE (2.5 MG/3ML) 0.083% IN NEBU: 2.5000 mg | INHALATION_SOLUTION | Freq: Once | RESPIRATORY_TRACT | Status: AC

## 2022-10-13 LAB — PULMONARY FUNCTION TEST ARMC ONLY
RV % pred: 102 %
RV: 2.28 L
TLC % pred: 82 %
TLC: 4.29 L

## 2022-10-13 NOTE — Progress Notes (Signed)
Sierra Brown, female    DOB: 02-04-1951   MRN: [id number]   Brief patient profile:  4  yowf active smoker referred to pulmonary clinic in [facility]  09/02/2022 by Merita Norton  for copd dval        Admit date:     08/02/2022  Discharge date: 08/03/22    Discharge Diagnoses: COVID infection COPD exacerbation--mild   Hospital Course:  Sierra Brown is a 72 y.o. female with medical history significant for COPD, tobacco use disorder, HTN, HLD and nonobstructive CAD, treated for the flu a couple months prior who presents to the ED with a 1 week history of cough and shortness of breath not responding to home albuterol treatment.  She arrived by EMS and had increased work of breathing, tachypneic to 24 but not hypoxic.   Chest x-ray showed chronic COPD changes and no active disease. Patient was treated with an additional DuoNeb as well as methylprednisolone and magnesium in the ED.    COPD with mild exacerbation (HCC) COVID-19 infection Patient started on Paxlovid from the ED Continue bronchodilators Antitussives and supportive care Airborne precautions Leucocytosis suspected reactive due to steroids. No fever. CXR no PNA Overall sats >92% on RA   Tobacco dependence due to cigarettes Nicotine patch    Hypertension Not currently on BP meds BP stable during hospital stay   Coronary artery calcification seen on CT scan Continue aspirin  Pt not taking statins per med rec--defer to PCP   Hemodynamically stable. Overall patient feels better. Will discharge to home with outpatient follow-up pulmonary and PCP.    History of Present Illness  09/02/2022  Pulmonary/ 1st office eval/ Graziella Connery / [employer] on trelegy  Chief Complaint  Patient presents with   Consult    Started with the Flu before Christmas. Ended up at the ED for SOB. SOB with exertion. Cough at night with clear sputum.  No wheezing.   Dyspnea:  can go anywhere just slowly  Cough: rattling mucus clear esp in am   Sleep: better prone level bed 2 pillows  SABA use: only uses in am before trelegy Covid 19 never vax  Rec Plan A = Automatic = Always=    Trelegy 100 one click in am  Plan B = Backup (to supplement plan A, not to replace it) Only use your albuterol inhaler as a rescue medication Plan C = Crisis (instead of Plan B but only if Plan B stops working) - only use your albuterol nebulizer if you first try Plan B  Also  Ok to try albuterol 15 min before an activity (on alternating days)  that you know would usually make you short of breath Make sure you check your oxygen saturation at your highest level of activity(NOT after you stop)  to be sure it stays over 90%  PFts 4/121/24 did SVC only = 63% got panicky    10/14/2022  f/u ov/Jeree Delc[facility]/ [facility] re: AB  maint on trelegy  100 Chief Complaint  Patient presents with   Follow-up    Cough with clear sputum in the mornings. No wheezing. SOB with exertion.   Dyspnea:  MMRC2 = can't walk a nl pace on a flat grade s sob but does fine slow and flat  Cough: mostly in am, x 15-20 min white  Sleeping: prone on a flat bed/ one-2 pillows does not wake up due to cough  SABA use: has not pretreated as rec  02: does not have it /  not checking sats at peak ex as rec  Lung cancer sreening  due q May    No obvious day to day or daytime variability or assoc  mucus plugs or hemoptysis or cp or chest tightness, subjective wheeze or overt sinus or hb symptoms.   sleeping without nocturnal  or early am exacerbation  of respiratory  c/o's or need for noct saba. Also denies any obvious fluctuation of symptoms with weather or environmental changes or other aggravating or alleviating factors except as outlined above   No unusual exposure hx or h/o childhood pna/ asthma or knowledge of premature birth.  Current Allergies, Complete Past Medical History, Past Surgical History, Family History, and Social History were reviewed in [employer]  record.  ROS  The following are not active complaints unless bolded Hoarseness, sore throat, dysphagia, dental problems, itching, sneezing,  nasal congestion or discharge of excess mucus or purulent secretions, ear ache,   fever, chills, sweats, unintended wt loss or wt gain, classically pleuritic or exertional cp,  orthopnea pnd or arm/hand swelling  or leg swelling, presyncope, palpitations, abdominal pain, anorexia, nausea, vomiting, diarrhea  or change in bowel habits or change in bladder habits, change in stools or change in urine, dysuria, hematuria,  rash, arthralgias, visual complaints, headache, numbness, weakness or ataxia or problems with walking or coordination,  change in mood/ anxious  or  memory.        Current Meds  Medication Sig   albuterol (VENTOLIN HFA) 108 (90 Base) MCG/ACT inhaler Inhale 2-4 puffs into the lungs every 4 (four) hours as needed for wheezing or shortness of breath.   aspirin EC 81 MG tablet Take 1 tablet (81 mg total) by mouth daily. Swallow whole.   Fluticasone-Umeclidin-Vilant (TRELEGY ELLIPTA) 100-62.5-25 MCG/ACT AEPB Inhale 1 puff into the lungs daily.   ipratropium-albuterol (DUONEB) 0.5-2.5 (3) MG/3ML SOLN Take 3 mLs by nebulization every 6 (six) hours as needed.   Misc. Devices (FINGERTIP PULSE OXIMETER) MISC 1 Device by Does not apply route daily.   sodium chloride (OCEAN) 0.65 % SOLN nasal spray Place 1 spray into both nostrils as needed for congestion (nose irritation).            Past Medical History:  Diagnosis Date   Allergy    allerlgic rhinitis   Cataract    COPD (chronic obstructive pulmonary disease) (HCC)    COVID    Pneumonia    Tobacco use disorder         Objective:       Wt Readings from Last 3 Encounters:  10/14/22 99 lb 6.4 oz (45.1 kg)  09/26/22 97 lb (44 kg)  09/02/22 101 lb 9.6 oz (46.1 kg)      Vital signs reviewed  10/14/2022  - Note at rest 02 sats  98% on RA    General appearance:    amb thin pleasant wf  nad       HEENT : Oropharynx  mucoid pnd  Nasal turbinates mild non-specific edema   NECK :  without  apparent JVD/ palpable Nodes/TM    LUNGS: no acc muscle use,  Min barrel  contour chest wall with bilateral  slightly decreased bs s audible wheeze and  without cough on insp or exp maneuvers and min  Hyperresonant  to  percussion bilaterally    CV:  RRR  no s3 or murmur or increase in P2, and no edema   ABD:  soft and nontender with pos end  insp  Hoover's  in the supine position.  No bruits or organomegaly appreciated   MS:  Nl gait/ ext warm without deformities Or obvious joint restrictions  calf tenderness, cyanosis - ? Very mild clubbing     SKIN: warm and dry without lesions    NEURO:  alert, approp, nl sensorium with  no motor or cerebellar deficits apparent.               Assessment

## 2022-10-14 ENCOUNTER — Ambulatory Visit: Payer: Medicare HMO | Admitting: Family Medicine

## 2022-10-14 ENCOUNTER — Ambulatory Visit: Payer: Medicare HMO | Admitting: Internal Medicine

## 2022-10-14 ENCOUNTER — Encounter: Payer: Self-pay | Admitting: Internal Medicine

## 2022-10-14 ENCOUNTER — Other Ambulatory Visit
Admission: RE | Admit: 2022-10-14 | Discharge: 2022-10-14 | Disposition: A | Discharge status: Home / Self Care | Payer: Medicare HMO | Source: Ambulatory Visit | Attending: Internal Medicine | Admitting: Internal Medicine

## 2022-10-14 VITALS — BP 122/70 | HR 77 | Temp 97.8°F | Ht 64.5 in | Wt 99.4 lb

## 2022-10-14 DIAGNOSIS — J449 Chronic obstructive pulmonary disease, unspecified: Secondary | ICD-10-CM | POA: Insufficient documentation

## 2022-10-14 DIAGNOSIS — F17210 Nicotine dependence, cigarettes, uncomplicated: Secondary | ICD-10-CM | POA: Diagnosis not present

## 2022-10-14 DIAGNOSIS — R69 Illness, unspecified: Secondary | ICD-10-CM | POA: Diagnosis not present

## 2022-10-14 LAB — CBC WITH DIFFERENTIAL/PLATELET
Abs Immature Granulocytes: 0.02 10*3/uL (ref 0.00–0.07)
Basophils Absolute: 0.1 10*3/uL (ref 0.0–0.1)
Basophils Relative: 1 %
Eosinophils Absolute: 0.5 10*3/uL (ref 0.0–0.5)
Eosinophils Relative: 7 %
HCT: 37.5 % (ref 36.0–46.0)
Hemoglobin: 12.4 g/dL (ref 12.0–15.0)
Immature Granulocytes: 0 %
Lymphocytes Relative: 18 %
Lymphs Abs: 1.3 10*3/uL (ref 0.7–4.0)
MCH: 31.6 pg (ref 26.0–34.0)
MCHC: 33.1 g/dL (ref 30.0–36.0)
MCV: 95.4 fL (ref 80.0–100.0)
Monocytes Absolute: 0.5 10*3/uL (ref 0.1–1.0)
Monocytes Relative: 7 %
Neutro Abs: 4.7 10*3/uL (ref 1.7–7.7)
Neutrophils Relative %: 67 %
Platelets: 279 10*3/uL (ref 150–400)
RBC: 3.93 MIL/uL (ref 3.87–5.11)
RDW: 12.9 % (ref 11.5–15.5)
WBC: 7 10*3/uL (ref 4.0–10.5)
nRBC: 0 % (ref 0.0–0.2)

## 2022-10-14 NOTE — Addendum Note (Signed)
Addended by: Samya Siciliano C on: 10/14/2022 10:13 AM   Modules accepted: Orders  

## 2022-10-14 NOTE — Assessment & Plan Note (Addendum)
Active smoking at initial pulmonary eval 09/02/2022  - 11/05/21  Marked centrilobular emphysema with diffuse bronchial wall thickening. - 09/02/2022  After extensive coaching inhaler device,  effectiveness =    50% (short Ti) with hfa and 75% with dpi so continue trelegy 100 pending pfts  - 10/06/22  PFTs :  only SVC done % predicted > could not  complete  spirometry Labs ordered 10/14/2022  :  Eos   alpha one AT phenotype    Unable to confirm copd but CT confirms emphysema and  Group D (now reclassified as E) in terms of symptom/risk and laba/lama/ICS  therefore appropriate rx at this point >>>  trelegy 100 one daily plus approp saba:  Re SABA :  I spent extra time with pt today reviewing appropriate use of albuterol for prn use on exertion with the following points: 1) saba is for relief of sob that does not improve by walking a slower pace or resting but rather if the pt does not improve after trying this first. 2) If the pt is convinced, as many are, that saba helps recover from activity faster then it's easy to tell if this is the case by re-challenging : ie stop, take the inhaler, then p 5 minutes try the exact same activity (intensity of workload) that just caused the symptoms and see if they are substantially diminished or not after saba 3) if there is an activity that reproducibly causes the symptoms, try the saba 15 min before the activity on alternate days   If in fact the saba really does help, then fine to continue to use it prn but advised may need to look closer at the maintenance regimen being used to achieve better control of airways disease with exertion.    Add mucinex up to 1200 mg twice daily for cough / congestion           Each maintenance medication was reviewed in detail including emphasizing most importantly the difference between maintenance and prns and under what circumstances the prns are to be triggered using an action plan format where appropriate.  Total time for  H and P, chart review, counseling, reviewing hfa/dpi/neb/pulse ox  device(s) and generating customized AVS unique to this office visit / same day charting = 30 min

## 2022-10-14 NOTE — Addendum Note (Signed)
Addended by: Lonell Face C on: 10/14/2022 10:13 AM   Modules accepted: Orders

## 2022-10-14 NOTE — Patient Instructions (Addendum)
Plan A = Automatic = Always=    Trelegy 100 one click in am   Plan B = Backup (to supplement plan A, not to replace it) Only use your albuterol inhaler as a rescue medication to be used if you can't catch your breath by resting or doing a relaxed purse lip breathing pattern.  - The less you use it, the better it will work when you need it. - Ok to use the inhaler up to 2 puffs  every 4 hours if you must but call for appointment if use goes up over your usual need - Don't leave home without it !!  (think of it like the spare tire for your car)   Plan C = Crisis (instead of Plan B but only if Plan B stops working) - only use your albuterol nebulizer if you first try Plan B and it fails to help > ok to use the nebulizer up to every 4 hours but if start needing it regularly call for immediate appointment   Also  Ok to try albuterol 15 min before an activity (on alternating days)  that you know would usually make you short of breath and see if it makes any difference and if makes none then don't take albuterol after activity unless you can't catch your breath as this means it's the resting that helps, not the albuterol.  Make sure you check your oxygen saturation at your highest level of activity(NOT after you stop)  to be sure it stays over 90% and keep track of it at least once a week, more often if breathing getting worse, and let me know if losing ground. (Collect the dots to connect the dots approach)    For cough / congestion > mucinex  up to 1200 mg every 12 hours as needed   The key is to stop smoking completely before smoking completely stops you!  Please remember to go to the lab department   for your tests - we will call you with the results when they are available.     Please schedule a follow up visit in 6  months but call sooner if needed

## 2022-10-14 NOTE — Assessment & Plan Note (Signed)
4-5 min discussion re active cigarette smoking in addition to office E&M  Ask about tobacco use:   ongoing Advise quitting   I took an extended  opportunity with this patient to outline the consequences of continued cigarette use  in airway disorders based on all the data we have from the multiple national lung health studies (perfomed over decades at millions of dollars in cost)  indicating that smoking cessation, not choice of inhalers or physicians, is the most important aspect of care.   Assess willingness:  > fully committed at this point Assist in quit attempt:  try uncoupling cigs from coffee  Arrange follow up:   Follow up per Primary Care planned

## 2022-10-17 ENCOUNTER — Encounter: Payer: Self-pay | Admitting: Family Medicine

## 2022-10-17 ENCOUNTER — Ambulatory Visit (INDEPENDENT_AMBULATORY_CARE_PROVIDER_SITE_OTHER): Payer: Medicare HMO | Admitting: Family Medicine

## 2022-10-17 VITALS — BP 138/57 | HR 65 | Temp 98.1°F | Ht 65.0 in | Wt 99.5 lb

## 2022-10-17 DIAGNOSIS — Z5320 Procedure and treatment not carried out because of patient's decision for unspecified reasons: Secondary | ICD-10-CM | POA: Diagnosis not present

## 2022-10-17 DIAGNOSIS — J441 Chronic obstructive pulmonary disease with (acute) exacerbation: Secondary | ICD-10-CM | POA: Diagnosis not present

## 2022-10-17 DIAGNOSIS — Z122 Encounter for screening for malignant neoplasm of respiratory organs: Secondary | ICD-10-CM | POA: Diagnosis not present

## 2022-10-17 DIAGNOSIS — I700 Atherosclerosis of aorta: Secondary | ICD-10-CM | POA: Diagnosis not present

## 2022-10-17 DIAGNOSIS — Z1231 Encounter for screening mammogram for malignant neoplasm of breast: Secondary | ICD-10-CM | POA: Diagnosis not present

## 2022-10-17 MED ORDER — ROSUVASTATIN CALCIUM 40 MG PO TABS: 40.0000 mg | ORAL_TABLET | Freq: Every day | ORAL | 3 refills | Status: DC

## 2022-10-17 NOTE — Assessment & Plan Note (Signed)
Previous smoker; had quit- then resumed. Due for annual in 10/2022

## 2022-10-17 NOTE — Progress Notes (Signed)
I,J'ya E Hunter,acting as a scribe for Jacky Kindle, FNP.,have documented all relevant documentation on the behalf of Jacky Kindle, FNP,as directed by  Jacky Kindle, FNP while in the presence of Jacky Kindle, FNP.  Established patient visit  Patient: Sierra Brown   DOB: 1951-02-28   72 y.o. Female  MRN: [id number] Visit Date: 10/17/2022  Today's healthcare provider: Jacky Kindle, FNP  Re Introduced to nurse practitioner role and practice setting.  All questions answered.  Discussed provider/patient relationship and expectations.  Chief Complaint  Patient presents with   Follow-up   Subjective    HPI  COPD, Follow up  She was last seen for this 3 months ago. Changes made include ipratropium-albuterol (DUONEB) 0.5-2.5 (3) MG/3ML SOLN.   She reports excellent compliance with treatment. She is not having side effects.  she uses rescue inhaler 7 per weeks. She says it depends on the week and the weather.  She IS experiencing dyspnea and cough. She is NOT experiencing wheezing, fatigue, chills, or fever. she reports breathing is Improved.  Pulmonary Functions Testing Results:  TLC  Date Value Ref Range Status  10/13/2022 4.29 L Final    ----------------------------------------------------------------------------------------- Hypertension, follow-up  BP Readings from Last 3 Encounters:  10/17/22 (!) 138/57  10/14/22 122/70  09/26/22 (!) 102/48   Wt Readings from Last 3 Encounters:  10/17/22 99 lb 8 oz (45.1 kg)  10/14/22 99 lb 6.4 oz (45.1 kg)  09/26/22 97 lb (44 kg)     She was last seen for hypertension 10 months ago.  BP at that visit was 148/56. Management since that visit includes lifestyle modifications.  She reports excellent compliance with treatment. She is not having side effects.  She is following a Regular diet. She is exercising. She does smoke, occasionally  Symptoms: No chest pain No chest pressure  No palpitations No syncope  Yes  dyspnea No orthopnea  No paroxysmal nocturnal dyspnea No lower extremity edema   Pertinent labs Lab Results  Component Value Date   CHOL 170 08/25/2021   HDL 65 08/25/2021   LDLCALC 85 08/25/2021   TRIG 112 08/25/2021   CHOLHDL 2.6 08/25/2021   Lab Results  Component Value Date   NA 142 08/08/2022   K 4.4 08/08/2022   CREATININE 0.76 08/08/2022   EGFR 85 08/08/2022   GLUCOSE 68 (L) 08/08/2022   TSH 1.770 07/12/2022     The 10-year ASCVD risk score (Arnett DK, et al., 2019) is: 14.6%  --------------------------------------------------------------------------------------------------- Patient is aware she needs mammogram, lung screening, and colon screening.  Medications: Outpatient Medications Prior to Visit  Medication Sig   albuterol (VENTOLIN HFA) 108 (90 Base) MCG/ACT inhaler Inhale 2-4 puffs into the lungs every 4 (four) hours as needed for wheezing or shortness of breath.   aspirin EC 81 MG tablet Take 1 tablet (81 mg total) by mouth daily. Swallow whole.   Fluticasone-Umeclidin-Vilant (TRELEGY ELLIPTA) 100-62.5-25 MCG/ACT AEPB Inhale 1 puff into the lungs daily.   ipratropium-albuterol (DUONEB) 0.5-2.5 (3) MG/3ML SOLN Take 3 mLs by nebulization every 6 (six) hours as needed.   Misc. Devices (FINGERTIP PULSE OXIMETER) MISC 1 Device by Does not apply route daily.   sodium chloride (OCEAN) 0.65 % SOLN nasal spray Place 1 spray into both nostrils as needed for congestion (nose irritation).   Facility-Administered Medications Prior to Visit  Medication Dose Route Frequency Provider   albuterol (PROVENTIL) (2.5 MG/3ML) 0.083% nebulizer solution 2.5 mg  2.5 mg Nebulization Once  Nyoka Cowden, MD    Review of Systems     Objective    BP (!) 138/57 (BP Location: Right Arm, Patient Position: Sitting, Cuff Size: Normal)   Pulse 65   Temp 98.1 F (36.7 C) (Oral)   Ht 5\' 5"  (1.651 m)   Wt 99 lb 8 oz (45.1 kg)   SpO2 98%   BMI 16.56 kg/m    Physical Exam Vitals and  nursing note reviewed.  Constitutional:      General: She is not in acute distress.    Appearance: Normal appearance. She is underweight. She is not ill-appearing, toxic-appearing or diaphoretic.  HENT:     Head: Normocephalic and atraumatic.  Cardiovascular:     Rate and Rhythm: Normal rate and regular rhythm.     Pulses: Normal pulses.     Heart sounds: Normal heart sounds. No murmur heard.    No friction rub. No gallop.  Pulmonary:     Effort: Pulmonary effort is normal. No respiratory distress.     Breath sounds: Normal breath sounds. No stridor. No wheezing, rhonchi or rales.  Chest:     Chest wall: No tenderness.  Musculoskeletal:        General: No swelling, tenderness, deformity or signs of injury. Normal range of motion.     Right lower leg: No edema.     Left lower leg: No edema.  Skin:    General: Skin is warm and dry.     Capillary Refill: Capillary refill takes less than 2 seconds.     Coloration: Skin is not jaundiced or pale.     Findings: No bruising, erythema, lesion or rash.  Neurological:     General: No focal deficit present.     Mental Status: She is alert and oriented to person, place, and time. Mental status is at baseline.     Cranial Nerves: No cranial nerve deficit.     Sensory: No sensory deficit.     Motor: No weakness.     Coordination: Coordination normal.  Psychiatric:        Mood and Affect: Mood normal.        Behavior: Behavior normal.        Thought Content: Thought content normal.        Judgment: Judgment normal.     No results found for any visits on 10/17/22.  Assessment & Plan     Problem List Items Addressed This Visit       Cardiovascular and Mediastinum   Aortic atherosclerosis    Chronic, had stopped crestor 40 during hospital stay; encouraged to restart given known blockages and high risk of ASCVD Last LDL >70 >1 year ago      Relevant Medications   rosuvastatin (CRESTOR) 40 MG tablet     Respiratory   Chronic  obstructive pulmonary disease with acute exacerbation - Primary    Chronic, overall improving; was unable to tolerate PFTs d/t claustrophobia Is doing well on trelegy; has picks up smoking again; noted that it is not every day and is aware of risk of further lung health\followed by Dr Sherene Sires Due for lung scan in 10/2022       Relevant Orders   Ambulatory Referral Lung Cancer Screening [facility]     Other   Colon cancer screening declined    Has kit at home for cologuard; will complete if within date      Encounter for screening for lung cancer    Previous smoker;  had quit- then resumed. Due for annual in 10/2022      Screening mammogram for breast cancer    Due for screening for mammogram, denies breast concerns, provided with phone number to call and schedule appointment for mammogram. Encouraged to repeat breast cancer screening every 1-2 years.       Relevant Orders   MM 3D SCREENING MAMMOGRAM BILATERAL BREAST   Return in about 4 months (around 02/16/2023) for annual examination.     Leilani Merl, FNP, have reviewed all documentation for this visit. The documentation on 10/17/22 for the exam, diagnosis, procedures, and orders are all accurate and complete.  Jacky Kindle, FNP  [facility] [phone] (phone) [phone] (fax)  [facility]

## 2022-10-17 NOTE — Patient Instructions (Signed)
Please call and schedule your mammogram:  [facility]  [address], [address] [facility] [place],  [place]  [address] Get Driving Directions Main: [phone]  Sunday:Closed Monday:7:20 AM - 5:00 PM Tuesday:7:20 AM - 5:00 PM Wednesday:7:20 AM - 5:00 PM Thursday:7:20 AM - 5:00 PM Friday:7:20 AM - 4:30 PM Saturday:Closed  

## 2022-10-17 NOTE — Assessment & Plan Note (Signed)
Chronic, overall improving; was unable to tolerate PFTs d/t claustrophobia Is doing well on trelegy; has picks up smoking again; noted that it is not every day and is aware of risk of further lung health\followed by Dr Sherene Sires Due for lung scan in 10/2022

## 2022-10-17 NOTE — Assessment & Plan Note (Signed)
Chronic, had stopped crestor 40 during hospital stay; encouraged to restart given known blockages and high risk of ASCVD Last LDL >70 >1 year ago

## 2022-10-17 NOTE — Assessment & Plan Note (Signed)
Has kit at home for cologuard; will complete if within date

## 2022-10-17 NOTE — Assessment & Plan Note (Signed)
Due for screening for mammogram, denies breast concerns, provided with phone number to call and schedule appointment for mammogram. Encouraged to repeat breast cancer screening every 1-2 years.  

## 2022-10-19 LAB — ALPHA-1-ANTITRYPSIN PHENOTYP: A-1 Antitrypsin, Ser: 186 mg/dL (ref 101–187)

## 2022-10-27 ENCOUNTER — Other Ambulatory Visit: Payer: Self-pay | Admitting: Family Medicine

## 2022-11-04 ENCOUNTER — Telehealth: Payer: Self-pay | Admitting: Internal Medicine

## 2022-11-04 NOTE — Telephone Encounter (Signed)
I have notified the patient of her lab results.   Noting further needed.

## 2022-11-08 ENCOUNTER — Ambulatory Visit
Admission: RE | Admit: 2022-11-08 | Discharge: 2022-11-08 | Disposition: A | Discharge status: Home / Self Care | Payer: Medicare HMO | Source: Ambulatory Visit | Attending: Family Medicine | Admitting: Family Medicine

## 2022-11-08 DIAGNOSIS — F17210 Nicotine dependence, cigarettes, uncomplicated: Secondary | ICD-10-CM | POA: Insufficient documentation

## 2022-11-08 DIAGNOSIS — Z122 Encounter for screening for malignant neoplasm of respiratory organs: Secondary | ICD-10-CM | POA: Diagnosis not present

## 2022-11-08 DIAGNOSIS — Z87891 Personal history of nicotine dependence: Secondary | ICD-10-CM

## 2022-11-14 ENCOUNTER — Other Ambulatory Visit: Payer: Self-pay

## 2022-11-14 DIAGNOSIS — F17210 Nicotine dependence, cigarettes, uncomplicated: Secondary | ICD-10-CM

## 2022-11-14 DIAGNOSIS — Z87891 Personal history of nicotine dependence: Secondary | ICD-10-CM

## 2022-11-14 DIAGNOSIS — Z122 Encounter for screening for malignant neoplasm of respiratory organs: Secondary | ICD-10-CM

## 2022-11-16 ENCOUNTER — Ambulatory Visit
Admission: RE | Admit: 2022-11-16 | Discharge: 2022-11-16 | Disposition: A | Discharge status: Home / Self Care | Payer: Medicare HMO | Source: Ambulatory Visit | Attending: Family Medicine | Admitting: Family Medicine

## 2022-11-16 DIAGNOSIS — Z1231 Encounter for screening mammogram for malignant neoplasm of breast: Secondary | ICD-10-CM | POA: Insufficient documentation

## 2022-11-17 NOTE — Progress Notes (Signed)
Hi Shlonda  Normal mammogram; repeat in 1 year.  Please let us know if you have any questions.  Thank you,  Merita Norton, FNP

## 2022-12-27 ENCOUNTER — Telehealth: Payer: Self-pay

## 2022-12-27 NOTE — Telephone Encounter (Unsigned)
Copied from CRM [id number]. Topic: General - Inquiry >> Dec 27, 2022 12:57 PM Marlow Baars wrote: Reason for CRM: The patient called in stating [facility] will be faxing over a request to get the patients 3 month prescription for Trelegy to be sent to them. This is a new pharmacy she is using through her insurance [employer]. She wants her provider to be on the lookout. Please assist patient further

## 2022-12-28 ENCOUNTER — Other Ambulatory Visit: Payer: Self-pay | Admitting: Family Medicine

## 2022-12-28 MED ORDER — TRELEGY ELLIPTA 100-62.5-25 MCG/ACT IN AEPB: 1.0000 | INHALATION_SPRAY | Freq: Every day | RESPIRATORY_TRACT | 11 refills | Status: DC

## 2023-02-20 ENCOUNTER — Ambulatory Visit (INDEPENDENT_AMBULATORY_CARE_PROVIDER_SITE_OTHER): Payer: Medicare HMO

## 2023-02-20 VITALS — Ht 65.0 in | Wt 99.8 lb

## 2023-02-20 DIAGNOSIS — Z0000 Encounter for general adult medical examination without abnormal findings: Secondary | ICD-10-CM

## 2023-02-20 NOTE — Patient Instructions (Signed)
Sierra Brown , Thank you for taking time to come for your Medicare Wellness Visit. I appreciate your ongoing commitment to your health goals. Please review the following plan we discussed and let me know if I can assist you in the future.   Referrals/Orders/Follow-Ups/Clinician Recommendations: none  This is a list of the screening recommended for you and due dates:  Health Maintenance  Topic Date Due   Colon Cancer Screening  Never done   Zoster (Shingles) Vaccine (1 of 2) Never done   DTaP/Tdap/Td vaccine (2 - Tdap) 06/28/2007   COVID-19 Vaccine (1 - 2023-24 season) Never done   Flu Shot  01/26/2023   Screening for Lung Cancer  11/08/2023   Mammogram  11/16/2023   Medicare Annual Wellness Visit  02/20/2024   Pneumonia Vaccine  Completed   DEXA scan (bone density measurement)  Completed   HPV Vaccine  Aged Out   Hepatitis C Screening  Discontinued    Advanced directives: (Declined) Advance directive discussed with you today. Even though you declined this today, please call our office should you change your mind, and we can give you the proper paperwork for you to fill out.  Next Medicare Annual Wellness Visit scheduled for next year: Yes 02/21/2024 @ 2:30pm telephone

## 2023-02-20 NOTE — Progress Notes (Signed)
Subjective:   Sierra Brown is a 72 y.o. female who presents for Medicare Annual (Subsequent) preventive examination.  Visit Complete: Virtual  I connected with  Cristan L Risser on 02/20/23 by a audio enabled telemedicine application and verified that I am speaking with the correct person using two identifiers.  Patient Location: Home  Provider Location: Home Office  I discussed the limitations of evaluation and management by telemedicine. The patient expressed understanding and agreed to proceed.  Vital Signs: Unable to obtain new vitals due to this being a telehealth visit.  Patient Medicare AWV questionnaire was completed by the patient on (not done); I have confirmed that all information answered by patient is correct and no changes since this date.  Review of Systems    Cardiac Risk Factors include: advanced age (>84men, >17 women);hypertension;smoking/ tobacco exposure    Objective:    Today's Vitals   02/20/23 1451  Weight: 99 lb 12.8 oz (45.3 kg)  Height: 5\' 5"  (1.651 m)   Body mass index is 16.61 kg/m.     02/20/2023    3:01 PM 09/18/2022   10:58 AM 08/02/2022    8:00 AM 07/23/2022    3:50 PM 07/22/2022    1:38 AM 02/17/2022    8:16 AM 02/14/2021    5:00 PM  Advanced Directives  Does Patient Have a Medical Advance Directive? No No No No No No No  Would patient like information on creating a medical advance directive?   No - Patient declined   No - Patient declined No - Patient declined    Current Medications (verified) Outpatient Encounter Medications as of 02/20/2023  Medication Sig   albuterol (VENTOLIN HFA) 108 (90 Base) MCG/ACT inhaler Inhale 2-4 puffs into the lungs every 4 (four) hours as needed for wheezing or shortness of breath.   aspirin EC 81 MG tablet Take 1 tablet (81 mg total) by mouth daily. Swallow whole.   Fluticasone-Umeclidin-Vilant (TRELEGY ELLIPTA) 100-62.5-25 MCG/ACT AEPB Inhale 1 puff into the lungs daily.   ipratropium-albuterol  (DUONEB) 0.5-2.5 (3) MG/3ML SOLN Take 3 mLs by nebulization every 6 (six) hours as needed.   Misc. Devices (FINGERTIP PULSE OXIMETER) MISC 1 Device by Does not apply route daily.   rosuvastatin (CRESTOR) 40 MG tablet Take 1 tablet (40 mg total) by mouth daily.   sodium chloride (OCEAN) 0.65 % SOLN nasal spray Place 1 spray into both nostrils as needed for congestion (nose irritation). (Patient not taking: Reported on 02/20/2023)   Facility-Administered Encounter Medications as of 02/20/2023  Medication   albuterol (PROVENTIL) (2.5 MG/3ML) 0.083% nebulizer solution 2.5 mg    Allergies (verified) Atorvastatin, Codeine, Levofloxacin, Sulfonamide derivatives, Penicillins, and Sulfa antibiotics   History: Past Medical History:  Diagnosis Date   Allergy    allerlgic rhinitis   Cataract    COPD (chronic obstructive pulmonary disease) (HCC)    COVID    Pneumonia    Tobacco use disorder    Past Surgical History:  Procedure Laterality Date   CESAREAN SECTION     TONSILLECTOMY     Family History  Problem Relation Age of Onset   Ulcerative colitis Mother        colostomy   Hypertension Mother    Heart Problems Mother    Breast cancer Neg Hx    Social History   Socioeconomic History   Marital status: Married    Spouse name: Not on file   Number of children: Not on file   Years of education: Not  on file   Highest education level: Not on file  Occupational History   Not on file  Tobacco Use   Smoking status: Every Day    Current packs/day: 0.75    Average packs/day: 0.8 packs/day for 49.0 years (36.8 ttl pk-yrs)    Types: Cigarettes   Smokeless tobacco: Never   Tobacco comments:    1-2 cigarettes daily- 10/14/2022  Vaping Use   Vaping status: Never Used  Substance and Sexual Activity   Alcohol use: Never   Drug use: Never   Sexual activity: Not on file  Other Topics Concern   Not on file  Social History Narrative   Not on file   Social Determinants of Health    Financial Resource Strain: Low Risk  (02/20/2023)   Overall Financial Resource Strain (CARDIA)    Difficulty of Paying Living Expenses: Not hard at all  Food Insecurity: No Food Insecurity (02/20/2023)   Hunger Vital Sign    Worried About Running Out of Food in the Last Year: Never true    Ran Out of Food in the Last Year: Never true  Transportation Needs: No Transportation Needs (02/20/2023)   PRAPARE - Administrator, Civil Service (Medical): No    Lack of Transportation (Non-Medical): No  Physical Activity: Sufficiently Active (02/20/2023)   Exercise Vital Sign    Days of Exercise per Week: 5 days    Minutes of Exercise per Session: 30 min  Stress: No Stress Concern Present (02/20/2023)   [employer] of Occupational Health - Occupational Stress Questionnaire    Feeling of Stress : Not at all  Social Connections: Moderately Integrated (02/20/2023)   Social Connection and Isolation Panel [NHANES]    Frequency of Communication with Friends and Family: More than three times a week    Frequency of Social Gatherings with Friends and Family: More than three times a week    Attends Religious Services: More than 4 times per year    Active Member of [employer] or Organizations: No    Attends Banker Meetings: Never    Marital Status: Married    Tobacco Counseling Ready to quit: Not Answered Counseling given: Not Answered Tobacco comments: 1-2 cigarettes daily- 10/14/2022   Clinical Intake:  Pre-visit preparation completed: Yes  Pain : No/denies pain     BMI - recorded: 16.61 Nutritional Status: BMI <19  Underweight Nutritional Risks: None Diabetes: No  How often do you need to have someone help you when you read instructions, pamphlets, or other written materials from your doctor or pharmacy?: 1 - Never  Interpreter Needed?: No  Comments: lives with husband Information entered by :: B.Zikeria Keough,LPN   Activities of Daily Living    02/20/2023     3:01 PM 10/17/2022    1:18 PM  In your present state of health, do you have any difficulty performing the following activities:  Hearing? 0 1  Vision? 0 0  Difficulty concentrating or making decisions? 0 0  Walking or climbing stairs? 0 0  Dressing or bathing? 0 0  Doing errands, shopping? 0 0  Preparing Food and eating ? N   Using the Toilet? N   In the past six months, have you accidently leaked urine? N   Do you have problems with loss of bowel control? N   Managing your Medications? N   Managing your Finances? N   Housekeeping or managing your Housekeeping? N     Patient Care Team: Jacky Kindle, FNP as  PCP - General (Family Medicine) Rennis Golden Lisette Abu, MD as PCP - Cardiology (Cardiology)  Indicate any recent Medical Services you may have received from other than [facility] providers in the past year (date may be approximate).     Assessment:   This is a routine wellness examination for Khalani.  Hearing/Vision screen Hearing Screening - Comments:: Adequate hearing Vision Screening - Comments:: Distance vision less:has appt next [facility]  Dietary issues and exercise activities discussed:     Goals Addressed             This Visit's Progress    DIET - EAT MORE FRUITS AND VEGETABLES   Not on track      Depression Screen    02/20/2023    2:59 PM 10/17/2022    1:18 PM 08/08/2022   10:22 AM 07/26/2022   10:59 AM 07/11/2022    5:21 PM 06/03/2022    8:14 AM 04/11/2022   11:15 AM  PHQ 2/9 Scores  PHQ - 2 Score 0 0 0 0 0 0 0  PHQ- 9 Score  0 2 2 2 1 3     Fall Risk    02/20/2023    2:55 PM 10/17/2022    1:18 PM 08/08/2022   10:17 AM 07/26/2022   10:59 AM 07/11/2022    5:21 PM  Fall Risk   Falls in the past year? 0 0 0 0 0  Number falls in past yr: 0 0  0 0  Injury with Fall? 0 0  0 0  Risk for fall due to : No Fall Risks No Fall Risks   No Fall Risks  Follow up Education provided;Falls prevention discussed  Falls evaluation completed  Falls evaluation  completed    MEDICARE RISK AT HOME: Medicare Risk at Home Any stairs in or around the home?: Yes If so, are there any without handrails?: Yes Home free of loose throw rugs in walkways, pet beds, electrical cords, etc?: Yes Adequate lighting in your home to reduce risk of falls?: Yes Life alert?: No Use of a cane, walker or w/c?: No Grab bars in the bathroom?: No Shower chair or bench in shower?: No Elevated toilet seat or a handicapped toilet?: No  TIMED UP AND GO:  Was the test performed?  No    Cognitive Function:        02/20/2023    3:05 PM 02/17/2022    8:18 AM  6CIT Screen  What Year? 0 points 0 points  What month? 0 points 0 points  What time? 0 points 0 points  Count back from 20 0 points 0 points  Months in reverse 0 points 0 points  Repeat phrase 0 points 0 points  Total Score 0 points 0 points    Immunizations Immunization History  Administered Date(s) Administered   Fluad Quad(high Dose 65+) 04/27/2022   Influenza Whole 03/28/2007, 04/08/2010   Influenza-Unspecified 03/27/2013, 04/27/2021   PNEUMOCOCCAL CONJUGATE-20 08/25/2021   Pneumococcal Polysaccharide-23 08/01/2007   Td 06/27/1997    TDAP status: Up to date  Flu Vaccine status: Up to date  Pneumococcal vaccine status: Up to date  Covid-19 vaccine status: Declined, Education has been provided regarding the importance of this vaccine but patient still declined. Advised may receive this vaccine at local pharmacy or Health Dept.or vaccine clinic. Aware to provide a copy of the vaccination record if obtained from local pharmacy or Health Dept. Verbalized acceptance and understanding.  Qualifies for Shingles Vaccine? Yes   Zostavax completed  No   Shingrix Completed?: No.    Education has been provided regarding the importance of this vaccine. Patient has been advised to call insurance company to determine out of pocket expense if they have not yet received this vaccine. Advised may also receive  vaccine at local pharmacy or Health Dept. Verbalized acceptance and understanding.  Screening Tests Health Maintenance  Topic Date Due   Colonoscopy  Never done   Zoster Vaccines- Shingrix (1 of 2) Never done   DTaP/Tdap/Td (2 - Tdap) 06/28/2007   COVID-19 Vaccine (1 - 2023-24 season) Never done   INFLUENZA VACCINE  01/26/2023   Lung Cancer Screening  11/08/2023   MAMMOGRAM  11/16/2023   Medicare Annual Wellness (AWV)  02/20/2024   Pneumonia Vaccine 62+ Years old  Completed   DEXA SCAN  Completed   HPV VACCINES  Aged Out   Hepatitis C Screening  Discontinued    Health Maintenance  Health Maintenance Due  Topic Date Due   Colonoscopy  Never done   Zoster Vaccines- Shingrix (1 of 2) Never done   DTaP/Tdap/Td (2 - Tdap) 06/28/2007   COVID-19 Vaccine (1 - 2023-24 season) Never done   INFLUENZA VACCINE  01/26/2023    Colorectal cancer screening: Type of screening: Colonoscopy. Completed no. Repeat every 5-10 years  Mammogram status: Completed yes. Repeat every year  Bone Density status: Completed yes. Results reflect: Bone density results: OSTEOPOROSIS. Repeat every 3 years.  Lung Cancer Screening: (Low Dose CT Chest recommended if Age 29-80 years, 20 pack-year currently smoking OR have quit w/in 15years.) does qualify.   Lung Cancer Screening Referral: already ordered  Additional Screening:  Hepatitis C Screening: does not qualify; Completed yes  Vision Screening: Recommended annual ophthalmology exams for early detection of glaucoma and other disorders of the eye. Is the patient up to date with their annual eye exam?  Yes  Who is the provider or what is the name of the office in which the patient attends annual eye exams? [facility] If pt is not established with a provider, would they like to be referred to a provider to establish care? No .   Dental Screening: Recommended annual dental exams for proper oral hygiene  Diabetic Foot Exam: n/a  Community Resource  Referral / Chronic Care Management: CRR required this visit?  No   CCM required this visit?  No    Plan:     I have personally reviewed and noted the following in the patient's chart:   Medical and social history Use of alcohol, tobacco or illicit drugs  Current medications and supplements including opioid prescriptions. Patient is not currently taking opioid prescriptions. Functional ability and status Nutritional status Physical activity Advanced directives List of other physicians Hospitalizations, surgeries, and ER visits in previous 12 months Vitals Screenings to include cognitive, depression, and falls Referrals and appointments  In addition, I have reviewed and discussed with patient certain preventive protocols, quality metrics, and best practice recommendations. A written personalized care plan for preventive services as well as general preventive health recommendations were provided to patient.    Sue Lush, LPN   [id number]/61/[id number]   After Visit Summary: (MyChart) Due to this being a telephonic visit, the after visit summary with patients personalized plan was offered to patient via MyChart   Nurse Notes: The patient states she is doing well and has no concerns or questions at this time.

## 2023-02-28 DIAGNOSIS — H2513 Age-related nuclear cataract, bilateral: Secondary | ICD-10-CM | POA: Diagnosis not present

## 2023-02-28 DIAGNOSIS — H52223 Regular astigmatism, bilateral: Secondary | ICD-10-CM | POA: Diagnosis not present

## 2023-02-28 DIAGNOSIS — H524 Presbyopia: Secondary | ICD-10-CM | POA: Diagnosis not present

## 2023-03-13 ENCOUNTER — Other Ambulatory Visit: Payer: Self-pay | Admitting: Family Medicine

## 2023-03-30 ENCOUNTER — Ambulatory Visit: Payer: Medicare HMO | Admitting: Internal Medicine

## 2023-04-10 ENCOUNTER — Ambulatory Visit: Payer: Medicare HMO | Admitting: Family Medicine

## 2023-04-10 ENCOUNTER — Other Ambulatory Visit: Payer: Self-pay | Admitting: Family Medicine

## 2023-04-10 ENCOUNTER — Encounter: Payer: Self-pay | Admitting: Family Medicine

## 2023-04-10 VITALS — BP 119/47 | HR 80 | Ht 65.0 in | Wt 99.6 lb

## 2023-04-10 DIAGNOSIS — I700 Atherosclerosis of aorta: Secondary | ICD-10-CM

## 2023-04-10 DIAGNOSIS — R636 Underweight: Secondary | ICD-10-CM | POA: Diagnosis not present

## 2023-04-10 DIAGNOSIS — I10 Essential (primary) hypertension: Secondary | ICD-10-CM | POA: Diagnosis not present

## 2023-04-10 DIAGNOSIS — F4322 Adjustment disorder with anxiety: Secondary | ICD-10-CM | POA: Diagnosis not present

## 2023-04-10 DIAGNOSIS — J418 Mixed simple and mucopurulent chronic bronchitis: Secondary | ICD-10-CM

## 2023-04-10 DIAGNOSIS — R7309 Other abnormal glucose: Secondary | ICD-10-CM | POA: Diagnosis not present

## 2023-04-10 NOTE — Assessment & Plan Note (Signed)
Chronic, well controlled Labs repeated to assist

## 2023-04-10 NOTE — Progress Notes (Signed)
Established patient visit   Patient: Sierra Brown   DOB: 1950-08-15   72 y.o. Female  MRN: [id number] Visit Date: 04/10/2023  Today's healthcare provider: Jacky Kindle, FNP  Introduced to nurse practitioner role and practice setting.  All questions answered.  Discussed provider/patient relationship and expectations.  Subjective    HPI HPI     Medical Management of Chronic Issues    Additional comments: Patient reports taking medications as prescribed and no symptoms to report.         Comments   Colonoscopy - Patient has cologuard kit at home      Last edited by Acey Lav, CMA on 04/10/2023  9:39 AM.      Medications: Outpatient Medications Prior to Visit  Medication Sig   albuterol (VENTOLIN HFA) 108 (90 Base) MCG/ACT inhaler Inhale 2-4 puffs into the lungs every 4 (four) hours as needed for wheezing or shortness of breath.   aspirin EC 81 MG tablet Take 1 tablet (81 mg total) by mouth daily. Swallow whole.   Fluticasone-Umeclidin-Vilant (TRELEGY ELLIPTA) 100-62.5-25 MCG/ACT AEPB Inhale 1 puff into the lungs daily.   ipratropium-albuterol (DUONEB) 0.5-2.5 (3) MG/3ML SOLN Take 3 mLs by nebulization every 6 (six) hours as needed.   Misc. Devices (FINGERTIP PULSE OXIMETER) MISC 1 Device by Does not apply route daily.   rosuvastatin (CRESTOR) 40 MG tablet TAKE 1 TABLET(40 MG) BY MOUTH DAILY   sodium chloride (OCEAN) 0.65 % SOLN nasal spray Place 1 spray into both nostrils as needed for congestion (nose irritation).   Facility-Administered Medications Prior to Visit  Medication Dose Route Frequency Provider   albuterol (PROVENTIL) (2.5 MG/3ML) 0.083% nebulizer solution 2.5 mg  2.5 mg Nebulization Once Nyoka Cowden, MD     Objective    BP (!) 119/47 (BP Location: Right Arm, Patient Position: Sitting, Cuff Size: Normal)   Pulse 80   Ht 5\' 5"  (1.651 m)   Wt 99 lb 9.6 oz (45.2 kg)   SpO2 100%   BMI 16.57 kg/m   Physical Exam Vitals and nursing note  reviewed.  Constitutional:      General: She is not in acute distress.    Appearance: Normal appearance. She is underweight. She is not ill-appearing, toxic-appearing or diaphoretic.  HENT:     Head: Normocephalic and atraumatic.  Cardiovascular:     Rate and Rhythm: Normal rate and regular rhythm.     Pulses: Normal pulses.     Heart sounds: Normal heart sounds. No murmur heard.    No friction rub. No gallop.  Pulmonary:     Effort: Pulmonary effort is normal. No respiratory distress.     Breath sounds: Normal breath sounds. No stridor. No wheezing, rhonchi or rales.  Chest:     Chest wall: No tenderness.  Musculoskeletal:        General: No swelling, tenderness, deformity or signs of injury. Normal range of motion.     Right lower leg: No edema.     Left lower leg: No edema.  Skin:    General: Skin is warm and dry.     Capillary Refill: Capillary refill takes less than 2 seconds.     Coloration: Skin is not jaundiced or pale.     Findings: No bruising, erythema, lesion or rash.  Neurological:     General: No focal deficit present.     Mental Status: She is alert and oriented to person, place, and time. Mental status is at baseline.  Cranial Nerves: No cranial nerve deficit.     Sensory: No sensory deficit.     Motor: No weakness.     Coordination: Coordination normal.  Psychiatric:        Mood and Affect: Mood normal.        Behavior: Behavior normal.        Thought Content: Thought content normal.        Judgment: Judgment normal.     No results found for any visits on 04/10/23.  Assessment & Plan     Problem List Items Addressed This Visit       Cardiovascular and Mediastinum   Aortic atherosclerosis (HCC) - Primary    Chronic, reported cramps on crestor 40; has reduced to 20 mg to assist  Symptoms primarily in hands and feet; unclear if traditional statin myopathy or OA Repeat LP      Relevant Orders   Lipid panel   RESOLVED: Primary hypertension    Relevant Orders   CBC with Differential/Platelet   Comprehensive Metabolic Panel (CMET)   TSH     Respiratory   Mixed simple and mucopurulent chronic bronchitis (HCC)    Chronic, stable Continues on trelegy and duonebs Has prn albuterol if needed No recent flares        Other   Abnormal glucose    Recommend A1c given variable hypoglycemia and hyperglycemia given low BMI Continue to recommend balanced, lower carb meals. Smaller meal size, adding snacks. Choosing water as drink of choice and increasing purposeful exercise.       Relevant Orders   Hemoglobin A1c   Adjustment disorder with anxious mood    Mother just moved to hospice Husband being treated for cancer with now acute ICU stay for CVA Also noted that they had to put one of her horses down d/t old age/illness earlier this year Continues to focus on positivity and denies need for medication to assist      Mildly underweight adult    Chronic, stable Body mass index is 16.57 kg/m. Continue to monitor       Return in about 6 months (around 10/09/2023) for annual examination.     Leilani Merl, FNP, have reviewed all documentation for this visit. The documentation on 04/10/23 for the exam, diagnosis, procedures, and orders are all accurate and complete.  Jacky Kindle, FNP  [facility] [phone] (phone) [phone] (fax)  [facility]

## 2023-04-10 NOTE — Assessment & Plan Note (Signed)
Recommend A1c given variable hypoglycemia and hyperglycemia given low BMI Continue to recommend balanced, lower carb meals. Smaller meal size, adding snacks. Choosing water as drink of choice and increasing purposeful exercise.

## 2023-04-10 NOTE — Assessment & Plan Note (Signed)
Mother just moved to hospice Husband being treated for cancer with now acute ICU stay for CVA Also noted that they had to put one of her horses down d/t old age/illness earlier this year Continues to focus on positivity and denies need for medication to assist

## 2023-04-10 NOTE — Assessment & Plan Note (Signed)
Chronic, stable Continues on trelegy and duonebs Has prn albuterol if needed No recent flares

## 2023-04-10 NOTE — Assessment & Plan Note (Signed)
Chronic, reported cramps on crestor 40; has reduced to 20 mg to assist  Symptoms primarily in hands and feet; unclear if traditional statin myopathy or OA Repeat LP

## 2023-04-10 NOTE — Assessment & Plan Note (Signed)
Chronic, stable Body mass index is 16.57 kg/m. Continue to monitor

## 2023-04-11 LAB — COMPREHENSIVE METABOLIC PANEL
ALT: 11 [IU]/L (ref 0–32)
AST: 14 [IU]/L (ref 0–40)
Albumin: 4.3 g/dL (ref 3.9–4.9)
Alkaline Phosphatase: 105 [IU]/L (ref 44–121)
BUN/Creatinine Ratio: 21 (ref 12–28)
BUN: 17 mg/dL (ref 8–27)
Bilirubin Total: 0.6 mg/dL (ref 0.0–1.2)
CO2: 22 mmol/L (ref 20–29)
Calcium: 9.6 mg/dL (ref 8.7–10.3)
Chloride: 104 mmol/L (ref 96–106)
Creatinine, Ser: 0.82 mg/dL (ref 0.57–1.00)
Globulin, Total: 1.8 g/dL (ref 1.5–4.5)
Glucose: 81 mg/dL (ref 70–99)
Potassium: 4.8 mmol/L (ref 3.5–5.2)
Sodium: 141 mmol/L (ref 134–144)
Total Protein: 6.1 g/dL (ref 6.0–8.5)
eGFR: 77 mL/min/{1.73_m2} (ref >59)

## 2023-04-11 LAB — CBC WITH DIFFERENTIAL/PLATELET
Basophils Absolute: 0.1 10*3/uL (ref 0.0–0.2)
Basos: 1 %
EOS (ABSOLUTE): 0.4 10*3/uL (ref 0.0–0.4)
Eos: 5 %
Hematocrit: 42.6 % (ref 34.0–46.6)
Hemoglobin: 14.3 g/dL (ref 11.1–15.9)
Immature Grans (Abs): 0 10*3/uL (ref 0.0–0.1)
Immature Granulocytes: 0 %
Lymphocytes Absolute: 1.5 10*3/uL (ref 0.7–3.1)
Lymphs: 18 %
MCH: 32.1 pg (ref 26.6–33.0)
MCHC: 33.6 g/dL (ref 31.5–35.7)
MCV: 96 fL (ref 79–97)
Monocytes Absolute: 0.7 10*3/uL (ref 0.1–0.9)
Monocytes: 9 %
Neutrophils Absolute: 5.5 10*3/uL (ref 1.4–7.0)
Neutrophils: 67 %
Platelets: 279 10*3/uL (ref 150–450)
RBC: 4.45 x10E6/uL (ref 3.77–5.28)
RDW: 11.8 % (ref 11.7–15.4)
WBC: 8.1 10*3/uL (ref 3.4–10.8)

## 2023-04-11 LAB — LIPID PANEL
Chol/HDL Ratio: 2.2 {ratio} (ref 0.0–4.4)
Cholesterol, Total: 141 mg/dL (ref 100–199)
HDL: 64 mg/dL (ref >39)
LDL Chol Calc (NIH): 57 mg/dL (ref 0–99)
Triglycerides: 113 mg/dL (ref 0–149)
VLDL Cholesterol Cal: 20 mg/dL (ref 5–40)

## 2023-04-11 LAB — TSH: TSH: 1.04 u[IU]/mL (ref 0.450–4.500)

## 2023-04-11 LAB — HEMOGLOBIN A1C
Est. average glucose Bld gHb Est-mCnc: 105 mg/dL
Hgb A1c MFr Bld: 5.3 % (ref 4.8–5.6)

## 2023-04-11 NOTE — Telephone Encounter (Signed)
Requested Prescriptions  Pending Prescriptions Disp Refills   albuterol (VENTOLIN HFA) 108 (90 Base) MCG/ACT inhaler [Pharmacy Med Name: ALBUTEROL HFA INH (200 PUFFS) 6.7GM] 6.7 g 0    Sig: INHALE 2 PUFFS INTO THE LUNGS EVERY 6 HOURS AS NEEDED FOR WHEEZING OR SHORTNESS OF BREATH     Pulmonology:  Beta Agonists 2 Failed - 04/10/2023  3:46 PM      Failed - Last BP in normal range    BP Readings from Last 1 Encounters:  04/10/23 (!) 119/47         Passed - Last Heart Rate in normal range    Pulse Readings from Last 1 Encounters:  04/10/23 80         Passed - Valid encounter within last 12 months    Recent Outpatient Visits           Yesterday Aortic atherosclerosis [facility])   [place] [facility] Merita Norton T, FNP   5 months ago Chronic obstructive pulmonary disease with acute exacerbation [facility])   [place] [facility] Merita Norton T, FNP   6 months ago Acute rhinosinusitis   [place] [facility] Nelson, [place], PA-C   8 months ago Shortness of breath   [facility] Merita Norton T, FNP   8 months ago Chronic obstructive pulmonary disease with acute exacerbation [facility])   [place] [facility] Jacky Kindle, FNP       Future Appointments             In 1 month Wert, Charlaine Dalton, MD [facility] [place] [facility] [place]   In 6 months Jacky Kindle, FNP [facility]

## 2023-04-17 ENCOUNTER — Ambulatory Visit
Admission: EM | Admit: 2023-04-17 | Discharge: 2023-04-17 | Disposition: A | Discharge status: Home / Self Care | Payer: Medicare HMO

## 2023-04-17 DIAGNOSIS — H6123 Impacted cerumen, bilateral: Secondary | ICD-10-CM

## 2023-04-17 NOTE — ED Provider Notes (Signed)
Renaldo Fiddler    CSN: [id number] Arrival date & time: 04/17/23  1130      History   Chief Complaint Chief Complaint  Patient presents with   Otalgia    HPI Sierra Brown is a 72 y.o. female.   Patient presents for evaluation of bilateral ear pain, fullness and decreased hearing, worse to the right side resin for 2 days.  Has attempted use of Tylenol which has been somewhat helpful.  Denies congestion worse from baseline, fever, ear drainage, pruritus.  Past Medical History:  Diagnosis Date   Allergy    allerlgic rhinitis   Cataract    COPD (chronic obstructive pulmonary disease) (HCC)    COVID    Pneumonia    Tobacco use disorder     Patient Active Problem List   Diagnosis Date Noted   Mixed simple and mucopurulent chronic bronchitis (HCC) 04/10/2023   Abnormal glucose 04/10/2023   Adjustment disorder with anxious mood 04/10/2023   COPD GOLD ? 09/02/2022   History of TIA (transient ischemic attack) 01/25/2022   Age-related osteoporosis without current pathological fracture 12/01/2021   Mildly underweight adult 12/01/2021   Annual physical exam 08/25/2021   Aortic atherosclerosis (HCC) 08/25/2021   Hypertension 01/20/2021   Coronary artery calcification seen on CT scan 09/16/2020    Past Surgical History:  Procedure Laterality Date   CESAREAN SECTION     TONSILLECTOMY      OB History   No obstetric history on file.      Home Medications    Prior to Admission medications   Medication Sig Start Date End Date Taking? Authorizing Provider  albuterol (VENTOLIN HFA) 108 (90 Base) MCG/ACT inhaler INHALE 2 PUFFS INTO THE LUNGS EVERY 6 HOURS AS NEEDED FOR WHEEZING OR SHORTNESS OF BREATH 04/11/23   Jacky Kindle, FNP  aspirin EC 81 MG tablet Take 1 tablet (81 mg total) by mouth daily. Swallow whole. 01/25/22   Jacky Kindle, FNP  Fluticasone-Umeclidin-Vilant (TRELEGY ELLIPTA) 100-62.5-25 MCG/ACT AEPB Inhale 1 puff into the lungs daily. 12/28/22   Jacky Kindle, FNP  ipratropium-albuterol (DUONEB) 0.5-2.5 (3) MG/3ML SOLN Take 3 mLs by nebulization every 6 (six) hours as needed. 07/26/22   Jacky Kindle, FNP  Misc. Devices (FINGERTIP PULSE OXIMETER) MISC 1 Device by Does not apply route daily. 07/23/22   Georga Hacking, MD  rosuvastatin (CRESTOR) 40 MG tablet TAKE 1 TABLET(40 MG) BY MOUTH DAILY 03/13/23   Jacky Kindle, FNP  sodium chloride (OCEAN) 0.65 % SOLN nasal spray Place 1 spray into both nostrils as needed for congestion (nose irritation). 08/03/22   Enedina Finner, MD    Family History Family History  Problem Relation Age of Onset   Ulcerative colitis Mother        colostomy   Hypertension Mother    Heart Problems Mother    Breast cancer Neg Hx     Social History Social History   Tobacco Use   Smoking status: Every Day    Current packs/day: 0.75    Average packs/day: 0.8 packs/day for 49.0 years (36.8 ttl pk-yrs)    Types: Cigarettes   Smokeless tobacco: Never   Tobacco comments:    1-2 cigarettes daily- 10/14/2022  Vaping Use   Vaping status: Never Used  Substance Use Topics   Alcohol use: Never   Drug use: Never     Allergies   Atorvastatin, Codeine, Levofloxacin, Sulfonamide derivatives, Penicillins, and Sulfa antibiotics   Review of Systems Review  of Systems   Physical Exam Triage Vital Signs ED Triage Vitals  Encounter Vitals Group     BP 04/17/23 1202 (!) 154/68     Systolic BP Percentile --      Diastolic BP Percentile --      Pulse Rate 04/17/23 1202 75     Resp 04/17/23 1202 16     Temp 04/17/23 1202 97.8 F (36.6 C)     Temp Source 04/17/23 1202 Temporal     SpO2 04/17/23 1202 98 %     Weight --      Height --      Head Circumference --      Peak Flow --      Pain Score 04/17/23 1200 5     Pain Loc --      Pain Education --      Exclude from Growth Chart --    No data found.  Updated Vital Signs BP (!) 154/68 (BP Location: Left Arm)   Pulse 75   Temp 97.8 F (36.6 C) (Temporal)    Resp 16   SpO2 98%   Visual Acuity Right Eye Distance:   Left Eye Distance:   Bilateral Distance:    Right Eye Near:   Left Eye Near:    Bilateral Near:     Physical Exam Constitutional:      Appearance: Normal appearance.  HENT:     Head: Normocephalic.     Right Ear: There is impacted cerumen.     Left Ear: There is impacted cerumen.     Nose: Nose normal.     Mouth/Throat:     Mouth: Mucous membranes are moist.     Pharynx: Oropharynx is clear.  Eyes:     Extraocular Movements: Extraocular movements intact.  Neurological:     Mental Status: She is alert.      UC Treatments / Results  Labs (all labs ordered are listed, but only abnormal results are displayed) Labs Reviewed - No data to display  EKG   Radiology No results found.  Procedures Procedures (including critical care time)  Medications Ordered in UC Medications - No data to display  Initial Impression / Assessment and Plan / UC Course  I have reviewed the triage vital signs and the nursing notes.  Pertinent labs & imaging results that were available during my care of the patient were reviewed by me and considered in my medical decision making (see chart for details).  Bilateral impacted cerumen  Impaction noted on exam, water irrigation completed by nursing staff, tolerated well, improvement seen immediately, on reevaluation no signs of infection, recommend over-the-counter Debrox drops moving forward with follow-up with urgent care as needed Final Clinical Impressions(s) / UC Diagnoses   Final diagnoses:  Bilateral impacted cerumen     Discharge Instructions      Today you were treated for ear fullness due to buildup of wax, ears have been irrigated with water  Moving forward you may use over-the-counter Debrox drops to help thin secretions making it easier to clean   may follow-up with his urgent care as needed if fullness recurs    ED Prescriptions   None    PDMP not reviewed  this encounter.   Valinda Hoar, NP 04/17/23 1244

## 2023-04-17 NOTE — Discharge Instructions (Addendum)
Today you were treated for ear fullness due to buildup of wax, ears have been irrigated with water  Moving forward you may use over-the-counter Debrox drops to help thin secretions making it easier to clean   may follow-up with his urgent care as needed if fullness recurs

## 2023-04-17 NOTE — ED Triage Notes (Signed)
Patient presents to [facility] for bilateral ear pain x 2 days. Treating with tylenol.   Denies fever or drainage.

## 2023-05-24 ENCOUNTER — Ambulatory Visit: Payer: Medicare HMO | Admitting: Internal Medicine

## 2023-06-02 ENCOUNTER — Other Ambulatory Visit: Payer: Self-pay | Admitting: Family Medicine

## 2023-07-10 ENCOUNTER — Telehealth: Payer: Self-pay | Admitting: Family Medicine

## 2023-07-10 NOTE — Telephone Encounter (Signed)
 [facility] pharmacy faxed request for the following medications:  Albuterol HFA INH (200 Puffs) 6.7GM   Please advise

## 2023-07-11 ENCOUNTER — Other Ambulatory Visit: Payer: Self-pay | Admitting: Family Medicine

## 2023-07-11 DIAGNOSIS — J441 Chronic obstructive pulmonary disease with (acute) exacerbation: Secondary | ICD-10-CM

## 2023-07-11 MED ORDER — ALBUTEROL SULFATE HFA 108 (90 BASE) MCG/ACT IN AERS
2.0000 | INHALATION_SPRAY | Freq: Four times a day (QID) | RESPIRATORY_TRACT | 1 refills | Status: DC | PRN
Start: 2023-07-11 — End: 2023-07-12

## 2023-07-12 ENCOUNTER — Other Ambulatory Visit: Payer: Self-pay | Admitting: Physician Assistant

## 2023-07-12 DIAGNOSIS — J441 Chronic obstructive pulmonary disease with (acute) exacerbation: Secondary | ICD-10-CM

## 2023-07-12 MED ORDER — ALBUTEROL SULFATE HFA 108 (90 BASE) MCG/ACT IN AERS: 2.0000 | INHALATION_SPRAY | Freq: Four times a day (QID) | RESPIRATORY_TRACT | 2 refills | Status: DC | PRN

## 2023-07-12 MED ORDER — ROSUVASTATIN CALCIUM 40 MG PO TABS: 40.0000 mg | ORAL_TABLET | Freq: Every day | ORAL | 2 refills | Status: DC

## 2023-07-12 NOTE — Telephone Encounter (Signed)
*  DID NO PICK UP THE ONE CALLED INTO [facility].   Medication Refill -  Most Recent Primary Care Visit:  Provider: Merita Norton T  Department: [facility]  Visit Type: OFFICE VISIT  Date: 04/10/2023  Medication: albuterol (VENTOLIN HFA) 108 (90 Base) MCG/ACT inhaler    AND    rosuvastatin (CRESTOR) 40 MG tablet [[id number]]   Has the patient contacted their pharmacy? Yes (Agent: If no, request that the patient contact the pharmacy for the refill. If patient does not wish to contact the pharmacy document the reason why and proceed with request.) (Agent: If yes, when and what did the pharmacy advise?)  Is this the correct pharmacy for this prescription? Yes If no, delete pharmacy and type the correct one.  This is the patient's preferred pharmacy:   [facility] [phone] [facility] [place] - [address] [place] [address] Phone: [phone] Fax: [phone]   Is the patient out of the medication? No  almost   Has the patient been seen for an appointment in the last year OR does the patient have an upcoming appointment? Yes  Can we respond through MyChart? Yes  Agent: Please be advised that Rx refills may take up to 3 business days. We ask that you follow-up with your pharmacy.

## 2023-07-12 NOTE — Telephone Encounter (Signed)
 Requested Prescriptions  Pending Prescriptions Disp Refills   albuterol  (VENTOLIN  HFA) 108 (90 Base) MCG/ACT inhaler 6.7 g 2    Sig: Inhale 2 puffs into the lungs every 6 (six) hours as needed for wheezing or shortness of breath. Given one refill - further needs needs to make appt.     Pulmonology:  Beta Agonists 2 Failed - 07/12/2023  2:47 PM      Failed - Last BP in normal range    BP Readings from Last 1 Encounters:  04/17/23 (!) 154/68         Passed - Last Heart Rate in normal range    Pulse Readings from Last 1 Encounters:  04/17/23 75         Passed - Valid encounter within last 12 months    Recent Outpatient Visits           3 months ago Aortic atherosclerosis [facility])   [place] [facility] Iona Manis T, FNP   8 months ago Chronic obstructive pulmonary disease with acute exacerbation [facility])   [place] [facility] Normie Becton, FNP   9 months ago Acute rhinosinusitis   [place] [facility] [place], [place], PA-C   11 months ago Shortness of breath   [facility] Iona Manis T, [place]   11 months ago Chronic obstructive pulmonary disease with acute exacerbation [facility])   [place] [facility] Iona Manis T, FNP       Future Appointments             In 2 months Ostwalt, Janna, PA-C [place] [facility]             rosuvastatin  (CRESTOR ) 40 MG tablet 90 tablet 2    Sig: Take 1 tablet (40 mg total) by mouth daily.     Cardiovascular:  Antilipid - Statins 2 Failed - 07/12/2023  2:47 PM      Failed - Lipid Panel in normal range within the last 12 months    Cholesterol, Total  Date Value Ref Range Status  04/10/2023 141 100 - 199 mg/dL Final   LDL Chol Calc (NIH)  Date Value Ref Range Status  04/10/2023 57 0 - 99 mg/dL Final   HDL  Date Value Ref Range Status  04/10/2023 64 >39 mg/dL Final   Triglycerides  Date Value Ref Range Status   04/10/2023 113 0 - 149 mg/dL Final         Passed - Cr in normal range and within 360 days    Creatinine, Ser  Date Value Ref Range Status  04/10/2023 0.82 0.57 - 1.00 mg/dL Final         Passed - Patient is not pregnant      Passed - Valid encounter within last 12 months    Recent Outpatient Visits           3 months ago Aortic atherosclerosis [facility])   [place] [facility] Iona Manis T, FNP   8 months ago Chronic obstructive pulmonary disease with acute exacerbation [facility])   [place] [facility] Normie Becton, FNP   9 months ago Acute rhinosinusitis   [facility] [place], [place], PA-C   11 months ago Shortness of breath   [facility] Iona Manis T, [place]   11 months ago Chronic obstructive pulmonary disease with acute exacerbation [facility])   [place] [facility]  Normie Becton, FNP       Future Appointments             In 2 months Ostwalt, Janna, PA-C [facility], [place]

## 2023-07-21 ENCOUNTER — Encounter: Payer: Self-pay | Admitting: Student in an Organized Health Care Education/Training Program

## 2023-07-21 ENCOUNTER — Ambulatory Visit: Payer: Self-pay | Admitting: Student in an Organized Health Care Education/Training Program

## 2023-07-21 VITALS — BP 126/60 | HR 76 | Temp 97.9°F | Ht 65.0 in | Wt 100.4 lb

## 2023-07-21 DIAGNOSIS — J441 Chronic obstructive pulmonary disease with (acute) exacerbation: Secondary | ICD-10-CM

## 2023-07-21 DIAGNOSIS — J439 Emphysema, unspecified: Secondary | ICD-10-CM

## 2023-07-21 MED ORDER — TRELEGY ELLIPTA 100-62.5-25 MCG/ACT IN AEPB: 1.0000 | INHALATION_SPRAY | Freq: Every day | RESPIRATORY_TRACT | 11 refills | Status: DC

## 2023-07-21 MED ORDER — IPRATROPIUM-ALBUTEROL 0.5-2.5 (3) MG/3ML IN SOLN: 3.0000 mL | Freq: Four times a day (QID) | RESPIRATORY_TRACT | 11 refills | Status: AC | PRN

## 2023-07-21 NOTE — Progress Notes (Signed)
Assessment & Plan:   #COPD - Emphysema  Pleasant 73 year old female with a past medical history of COPD/Emphysema presenting for follow up. She's maintained on ICS/LABA/LAMA with Trelegy which we will continue given good response. She's been unable to have PFT's in the past, and discussed this again with her. Will attempt spirometry as that will not trigger claustrophobia. She continues to smoke and discussed importance of smoking cessation during today's visit. She has multiple psychosocial stressors at the moment, and we will discuss pulmonary rehabilitation and potential for EBLVR on follow up given her significant emphysema. Alpha-1 checked previously with normal levels and MM genotype.  - Pulmonary Function Test [facility] Only; Future - Fluticasone-Umeclidin-Vilant (TRELEGY ELLIPTA) 100-62.5-25 MCG/ACT AEPB; Inhale 1 puff into the lungs daily.  Dispense: 60 each; Refill: 11 - ipratropium-albuterol (DUONEB) 0.5-2.5 (3) MG/3ML SOLN; Take 3 mLs by nebulization every 6 (six) hours as needed.  Dispense: 360 mL; Refill: 11   Return in about 6 months (around 01/18/2024).  I spent 42 minutes caring for this patient today, including preparing to see the patient, obtaining a medical history , reviewing a separately obtained history, performing a medically appropriate examination and/or evaluation, counseling and educating the patient/family/caregiver, ordering medications, tests, or procedures, and documenting clinical information in the electronic health record  Raechel Chute, MD [facility] 07/21/2023 12:29 PM    End of visit medications:  Meds ordered this encounter  Medications   Fluticasone-Umeclidin-Vilant (TRELEGY ELLIPTA) 100-62.5-25 MCG/ACT AEPB    Sig: Inhale 1 puff into the lungs daily.    Dispense:  60 each    Refill:  11   ipratropium-albuterol (DUONEB) 0.5-2.5 (3) MG/3ML SOLN    Sig: Take 3 mLs by nebulization every 6 (six) hours as needed.    Dispense:  360  mL    Refill:  11     Current Outpatient Medications:    albuterol (VENTOLIN HFA) 108 (90 Base) MCG/ACT inhaler, Inhale 2 puffs into the lungs every 6 (six) hours as needed for wheezing or shortness of breath. Given one refill - further needs needs to make appt., Disp: 6.7 g, Rfl: 2   aspirin EC 81 MG tablet, Take 1 tablet (81 mg total) by mouth daily. Swallow whole., Disp: 90 tablet, Rfl: 3   Misc. Devices (FINGERTIP PULSE OXIMETER) MISC, 1 Device by Does not apply route daily., Disp: 1 each, Rfl: 0   rosuvastatin (CRESTOR) 40 MG tablet, Take 1 tablet (40 mg total) by mouth daily., Disp: 90 tablet, Rfl: 2   sodium chloride (OCEAN) 0.65 % SOLN nasal spray, Place 1 spray into both nostrils as needed for congestion (nose irritation)., Disp: 30 mL, Rfl: 0   Fluticasone-Umeclidin-Vilant (TRELEGY ELLIPTA) 100-62.5-25 MCG/ACT AEPB, Inhale 1 puff into the lungs daily., Disp: 60 each, Rfl: 11   ipratropium-albuterol (DUONEB) 0.5-2.5 (3) MG/3ML SOLN, Take 3 mLs by nebulization every 6 (six) hours as needed., Disp: 360 mL, Rfl: 11 No current facility-administered medications for this visit.  Facility-Administered Medications Ordered in Other Visits:    albuterol (PROVENTIL) (2.5 MG/3ML) 0.083% nebulizer solution 2.5 mg, 2.5 mg, Nebulization, Once, Nyoka Cowden, MD   Subjective:   PATIENT ID: Sierra Brown GENDER: female DOB: 12/15/1950, MRN: [id number]  Chief Complaint  Patient presents with   Follow-up    HPI  Patient is a pleasant 73 year old female with past medical history of emphysema.  Patient has a past medical history of emphysema currently maintained on ICS/LABA/LAMA therapy with Trelegy.  She  was previously followed by Dr. Sherene Sires and has transitioned her care as she does not want to drive to [facility].  She reports symptoms of exertional dyspnea, occasional cough, and wheeze.  Otherwise, she is in her usual state of health and there is been no change to her symptoms since her last  visit with [place].  She does not have any fevers, chills, night sweats, or chest pain.  She uses her albuterol occasionally and is compliant with Trelegy.  She is enrolled in our lung cancer screening program and her last CT scan was in May 2024.  She unfortunately continues to smoke. She was unable to perform PFT's on the first attempt.  She reports personal history of smoking up to a pack a day for around 15 years.  She probably has 50 pack years of smoking history. She has two horses on her property.  Ancillary information including prior medications, full medical/surgical/family/social histories, and PFTs (when available) are listed below and have been reviewed.   Review of Systems  Constitutional:  Negative for chills, fever and weight loss.  Respiratory:  Positive for cough, sputum production and shortness of breath. Negative for hemoptysis and wheezing.   Cardiovascular:  Negative for chest pain.     Objective:   Vitals:   07/21/23 0914  BP: 126/60  Pulse: 76  Temp: 97.9 F (36.6 C)  TempSrc: Temporal  SpO2: 99%  Weight: 100 lb 6.4 oz (45.5 kg)  Height: 5\' 5"  (1.651 m)   99% on RA  BMI Readings from Last 3 Encounters:  07/21/23 16.71 kg/m  04/10/23 16.57 kg/m  02/20/23 16.61 kg/m   Wt Readings from Last 3 Encounters:  07/21/23 100 lb 6.4 oz (45.5 kg)  04/10/23 99 lb 9.6 oz (45.2 kg)  02/20/23 99 lb 12.8 oz (45.3 kg)    Physical Exam Constitutional:      General: She is not in acute distress.    Appearance: She is not ill-appearing.  Cardiovascular:     Rate and Rhythm: Normal rate and regular rhythm.     Pulses: Normal pulses.     Heart sounds: Normal heart sounds.  Pulmonary:     Breath sounds: No wheezing.     Comments: Decreased air entry bilaterally Neurological:     General: No focal deficit present.     Mental Status: She is alert and oriented to person, place, and time. Mental status is at baseline.       Ancillary Information    Past Medical  History:  Diagnosis Date   Allergy    allerlgic rhinitis   Cataract    COPD (chronic obstructive pulmonary disease) (HCC)    COVID    Pneumonia    Tobacco use disorder      Family History  Problem Relation Age of Onset   Ulcerative colitis Mother        colostomy   Hypertension Mother    Heart Problems Mother    Breast cancer Neg Hx      Past Surgical History:  Procedure Laterality Date   CESAREAN SECTION     TONSILLECTOMY      Social History   Socioeconomic History   Marital status: Married    Spouse name: Not on file   Number of children: Not on file   Years of education: Not on file   Highest education level: Not on file  Occupational History   Not on file  Tobacco Use   Smoking status: Every Day  Current packs/day: 0.75    Average packs/day: 0.8 packs/day for 49.0 years (36.8 ttl pk-yrs)    Types: Cigarettes   Smokeless tobacco: Never   Tobacco comments:    1-2 cigarettes daily- 10/14/2022  Vaping Use   Vaping status: Never Used  Substance and Sexual Activity   Alcohol use: Never   Drug use: Never   Sexual activity: Not on file  Other Topics Concern   Not on file  Social History Narrative   Not on file   Social Drivers of Health   Financial Resource Strain: Low Risk  (02/20/2023)   Overall Financial Resource Strain (CARDIA)    Difficulty of Paying Living Expenses: Not hard at all  Food Insecurity: No Food Insecurity (02/20/2023)   Hunger Vital Sign    Worried About Running Out of Food in the Last Year: Never true    Ran Out of Food in the Last Year: Never true  Transportation Needs: No Transportation Needs (02/20/2023)   PRAPARE - Administrator, Civil Service (Medical): No    Lack of Transportation (Non-Medical): No  Physical Activity: Sufficiently Active (02/20/2023)   Exercise Vital Sign    Days of Exercise per Week: 5 days    Minutes of Exercise per Session: 30 min  Stress: No Stress Concern Present (02/20/2023)   [employer] of Occupational Health - Occupational Stress Questionnaire    Feeling of Stress : Not at all  Social Connections: Moderately Integrated (02/20/2023)   Social Connection and Isolation Panel [NHANES]    Frequency of Communication with Friends and Family: More than three times a week    Frequency of Social Gatherings with Friends and Family: More than three times a week    Attends Religious Services: More than 4 times per year    Active Member of Clubs or Organizations: No    Attends Banker Meetings: Never    Marital Status: Married  Catering manager Violence: Not At Risk (02/20/2023)   Humiliation, Afraid, Rape, and Kick questionnaire    Fear of Current or Ex-Partner: No    Emotionally Abused: No    Physically Abused: No    Sexually Abused: No     Allergies  Allergen Reactions   Atorvastatin Other (See Comments)    Myalgias   Codeine Nausea And Vomiting   Levofloxacin Nausea And Vomiting   Sulfonamide Derivatives Hives   Penicillins Hives and Rash   Sulfa Antibiotics Hives and Rash     CBC    Component Value Date/Time   WBC 8.1 04/10/2023 0959   WBC 7.0 10/14/2022 1017   RBC 4.45 04/10/2023 0959   RBC 3.93 10/14/2022 1017   HGB 14.3 04/10/2023 0959   HCT 42.6 04/10/2023 0959   PLT 279 04/10/2023 0959   MCV 96 04/10/2023 0959   MCH 32.1 04/10/2023 0959   MCH 31.6 10/14/2022 1017   MCHC 33.6 04/10/2023 0959   MCHC 33.1 10/14/2022 1017   RDW 11.8 04/10/2023 0959   LYMPHSABS 1.5 04/10/2023 0959   MONOABS 0.5 10/14/2022 1017   EOSABS 0.4 04/10/2023 0959   BASOSABS 0.1 04/10/2023 0959    Pulmonary Functions Testing Results:    Latest Ref Rng & Units 10/13/2022    8:43 AM  PFT Results  TLC L 4.29   TLC % Predicted % 82   RV % Predicted % 102     Outpatient Medications Prior to Visit  Medication Sig Dispense Refill   albuterol (VENTOLIN HFA) 108 (90 Base)  MCG/ACT inhaler Inhale 2 puffs into the lungs every 6 (six) hours as needed for wheezing  or shortness of breath. Given one refill - further needs needs to make appt. 6.7 g 2   aspirin EC 81 MG tablet Take 1 tablet (81 mg total) by mouth daily. Swallow whole. 90 tablet 3   Misc. Devices (FINGERTIP PULSE OXIMETER) MISC 1 Device by Does not apply route daily. 1 each 0   rosuvastatin (CRESTOR) 40 MG tablet Take 1 tablet (40 mg total) by mouth daily. 90 tablet 2   sodium chloride (OCEAN) 0.65 % SOLN nasal spray Place 1 spray into both nostrils as needed for congestion (nose irritation). 30 mL 0   Fluticasone-Umeclidin-Vilant (TRELEGY ELLIPTA) 100-62.5-25 MCG/ACT AEPB Inhale 1 puff into the lungs daily. 60 each 11   ipratropium-albuterol (DUONEB) 0.5-2.5 (3) MG/3ML SOLN Take 3 mLs by nebulization every 6 (six) hours as needed. 360 mL 11   Facility-Administered Medications Prior to Visit  Medication Dose Route Frequency Provider Last Rate Last Admin   albuterol (PROVENTIL) (2.5 MG/3ML) 0.083% nebulizer solution 2.5 mg  2.5 mg Nebulization Once Nyoka Cowden, MD

## 2023-10-04 ENCOUNTER — Ambulatory Visit
Admission: EM | Admit: 2023-10-04 | Discharge: 2023-10-04 | Disposition: A | Discharge status: Home / Self Care | Attending: Emergency Medicine | Admitting: Emergency Medicine

## 2023-10-04 ENCOUNTER — Encounter: Payer: Self-pay | Admitting: Emergency Medicine

## 2023-10-04 DIAGNOSIS — J441 Chronic obstructive pulmonary disease with (acute) exacerbation: Secondary | ICD-10-CM | POA: Diagnosis not present

## 2023-10-04 MED ORDER — DOXYCYCLINE HYCLATE 100 MG PO CAPS
100.0000 mg | ORAL_CAPSULE | Freq: Two times a day (BID) | ORAL | 0 refills | Status: AC
Start: 2023-10-04 — End: 2023-10-11

## 2023-10-04 MED ORDER — PREDNISONE 10 MG PO TABS: 40.0000 mg | ORAL_TABLET | Freq: Every day | ORAL | 0 refills | Status: AC

## 2023-10-04 NOTE — Discharge Instructions (Addendum)
 Follow up with your primary care provider tomorrow.  Go to the emergency department if you have worsening symptoms.    Take the prednisone and doxycycline as directed.  Use your albuterol inhaler or nebulizer treatment as directed.

## 2023-10-04 NOTE — ED Provider Notes (Signed)
 Renaldo Fiddler    CSN: [id number] Arrival date & time: 10/04/23  0801      History   Chief Complaint No chief complaint on file.   HPI Sierra Brown is a 73 y.o. female.  Patient presents with 4 to 5-day history of pressure in her ears, congestion, postnasal drip, productive cough.  She states her shortness of breath is at baseline.  No fever.  She has been treating her symptoms with Tylenol, phenylephrine, albuterol nebulizer.  Her medical history includes COPD and pulmonary emphysema.  Last seen by pulmonology on 07/21/2023.  The history is provided by the patient and medical records.    Past Medical History:  Diagnosis Date   Allergy    allerlgic rhinitis   Cataract    COPD (chronic obstructive pulmonary disease) (HCC)    COVID    Pneumonia    Tobacco use disorder     Patient Active Problem List   Diagnosis Date Noted   Mixed simple and mucopurulent chronic bronchitis (HCC) 04/10/2023   Abnormal glucose 04/10/2023   Adjustment disorder with anxious mood 04/10/2023   COPD GOLD ? 09/02/2022   History of TIA (transient ischemic attack) 01/25/2022   Age-related osteoporosis without current pathological fracture 12/01/2021   Mildly underweight adult 12/01/2021   Annual physical exam 08/25/2021   Aortic atherosclerosis (HCC) 08/25/2021   Hypertension 01/20/2021   Coronary artery calcification seen on CT scan 09/16/2020    Past Surgical History:  Procedure Laterality Date   CESAREAN SECTION     TONSILLECTOMY      OB History   No obstetric history on file.      Home Medications    Prior to Admission medications   Medication Sig Start Date End Date Taking? Authorizing Provider  doxycycline (VIBRAMYCIN) 100 MG capsule Take 1 capsule (100 mg total) by mouth 2 (two) times daily for 7 days. 10/04/23 10/11/23 Yes Mickie Bail, NP  predniSONE (DELTASONE) 10 MG tablet Take 4 tablets (40 mg total) by mouth daily for 5 days. 10/04/23 10/09/23 Yes Mickie Bail, NP   albuterol (VENTOLIN HFA) 108 (90 Base) MCG/ACT inhaler Inhale 2 puffs into the lungs every 6 (six) hours as needed for wheezing or shortness of breath. Given one refill - further needs needs to make appt. 07/12/23   Sallee Provencal, FNP  aspirin EC 81 MG tablet Take 1 tablet (81 mg total) by mouth daily. Swallow whole. 01/25/22   Jacky Kindle, FNP  Fluticasone-Umeclidin-Vilant (TRELEGY ELLIPTA) 100-62.5-25 MCG/ACT AEPB Inhale 1 puff into the lungs daily. 07/21/23   Raechel Chute, MD  ipratropium-albuterol (DUONEB) 0.5-2.5 (3) MG/3ML SOLN Take 3 mLs by nebulization every 6 (six) hours as needed. 07/21/23   Raechel Chute, MD  Misc. Devices (FINGERTIP PULSE OXIMETER) MISC 1 Device by Does not apply route daily. 07/23/22   Georga Hacking, MD  rosuvastatin (CRESTOR) 40 MG tablet Take 1 tablet (40 mg total) by mouth daily. 07/12/23   Simmons-Robinson, Tawanna Cooler, MD  sodium chloride (OCEAN) 0.65 % SOLN nasal spray Place 1 spray into both nostrils as needed for congestion (nose irritation). 08/03/22   Enedina Finner, MD    Family History Family History  Problem Relation Age of Onset   Ulcerative colitis Mother        colostomy   Hypertension Mother    Heart Problems Mother    Breast cancer Neg Hx     Social History Social History   Tobacco Use   Smoking status: Every  Day    Current packs/day: 0.75    Average packs/day: 0.8 packs/day for 49.0 years (36.8 ttl pk-yrs)    Types: Cigarettes   Smokeless tobacco: Never   Tobacco comments:    1-2 cigarettes daily- 10/14/2022  Vaping Use   Vaping status: Never Used  Substance Use Topics   Alcohol use: Never   Drug use: Never     Allergies   Atorvastatin, Codeine, Levofloxacin, Sulfonamide derivatives, Penicillins, and Sulfa antibiotics   Review of Systems Review of Systems  Constitutional:  Negative for chills and fever.  HENT:  Positive for congestion, ear pain, postnasal drip, rhinorrhea and sore throat.   Respiratory:  Positive for cough  and shortness of breath.      Physical Exam Triage Vital Signs ED Triage Vitals  Encounter Vitals Group     BP      Systolic BP Percentile      Diastolic BP Percentile      Pulse      Resp      Temp      Temp src      SpO2      Weight      Height      Head Circumference      Peak Flow      Pain Score      Pain Loc      Pain Education      Exclude from Growth Chart    No data found.  Updated Vital Signs BP 134/72   Pulse 88   Temp 98.5 F (36.9 C)   Resp 18   SpO2 94%   Visual Acuity Right Eye Distance:   Left Eye Distance:   Bilateral Distance:    Right Eye Near:   Left Eye Near:    Bilateral Near:     Physical Exam Constitutional:      General: She is not in acute distress. HENT:     Right Ear: Tympanic membrane normal.     Left Ear: Tympanic membrane normal.     Nose: Congestion and rhinorrhea present.     Mouth/Throat:     Mouth: Mucous membranes are moist.     Pharynx: Oropharynx is clear.  Cardiovascular:     Rate and Rhythm: Normal rate and regular rhythm.     Heart sounds: Normal heart sounds.  Pulmonary:     Effort: Pulmonary effort is normal. No respiratory distress.     Breath sounds: Normal breath sounds. Decreased air movement present.  Neurological:     Mental Status: She is alert.      UC Treatments / Results  Labs (all labs ordered are listed, but only abnormal results are displayed) Labs Reviewed - No data to display  EKG   Radiology No results found.  Procedures Procedures (including critical care time)  Medications Ordered in UC Medications - No data to display  Initial Impression / Assessment and Plan / UC Course  I have reviewed the triage vital signs and the nursing notes.  Pertinent labs & imaging results that were available during my care of the patient were reviewed by me and considered in my medical decision making (see chart for details).   COPD exacerbation.  Afebrile and vital signs are stable.  O2 sat  94% on room air.  Instructed patient to continue albuterol nebulizer or inhaler as directed.  Treating today with prednisone and doxycycline.  Instructed her to follow-up with her PCP tomorrow.  ED precautions given.  Education provided  on COPD exacerbation.  Patient agrees to plan of care.   Final Clinical Impressions(s) / UC Diagnoses   Final diagnoses:  COPD exacerbation [facility])     Discharge Instructions      Follow up with your primary care provider tomorrow.  Go to the emergency department if you have worsening symptoms.    Take the prednisone and doxycycline as directed.  Use your albuterol inhaler or nebulizer treatment as directed.     ED Prescriptions     Medication Sig Dispense Auth. Provider   predniSONE (DELTASONE) 10 MG tablet Take 4 tablets (40 mg total) by mouth daily for 5 days. 20 tablet Mickie Bail, NP   doxycycline (VIBRAMYCIN) 100 MG capsule Take 1 capsule (100 mg total) by mouth 2 (two) times daily for 7 days. 14 capsule Mickie Bail, NP      PDMP not reviewed this encounter.   Mickie Bail, NP 10/04/23 [phone]

## 2023-10-09 ENCOUNTER — Ambulatory Visit: Payer: Self-pay | Admitting: Physician Assistant

## 2023-10-09 ENCOUNTER — Encounter: Payer: Medicare HMO | Admitting: Family Medicine

## 2023-10-09 ENCOUNTER — Encounter: Payer: Self-pay | Admitting: Physician Assistant

## 2023-10-09 VITALS — BP 139/69 | HR 83 | Resp 16 | Ht 63.0 in | Wt 92.8 lb

## 2023-10-09 DIAGNOSIS — R636 Underweight: Secondary | ICD-10-CM | POA: Diagnosis not present

## 2023-10-09 DIAGNOSIS — R7309 Other abnormal glucose: Secondary | ICD-10-CM

## 2023-10-09 DIAGNOSIS — R5383 Other fatigue: Secondary | ICD-10-CM | POA: Diagnosis not present

## 2023-10-09 DIAGNOSIS — H6123 Impacted cerumen, bilateral: Secondary | ICD-10-CM | POA: Diagnosis not present

## 2023-10-09 DIAGNOSIS — I700 Atherosclerosis of aorta: Secondary | ICD-10-CM

## 2023-10-09 DIAGNOSIS — H918X3 Other specified hearing loss, bilateral: Secondary | ICD-10-CM

## 2023-10-09 DIAGNOSIS — J441 Chronic obstructive pulmonary disease with (acute) exacerbation: Secondary | ICD-10-CM | POA: Diagnosis not present

## 2023-10-09 DIAGNOSIS — Z720 Tobacco use: Secondary | ICD-10-CM | POA: Diagnosis not present

## 2023-10-09 DIAGNOSIS — J329 Chronic sinusitis, unspecified: Secondary | ICD-10-CM

## 2023-10-09 DIAGNOSIS — J418 Mixed simple and mucopurulent chronic bronchitis: Secondary | ICD-10-CM

## 2023-10-09 MED ORDER — AZELASTINE HCL 0.1 % NA SOLN: 1.0000 | Freq: Two times a day (BID) | NASAL | 5 refills | Status: AC

## 2023-10-09 MED ORDER — MIRTAZAPINE 7.5 MG PO TABS: 7.5000 mg | ORAL_TABLET | Freq: Every day | ORAL | 1 refills | Status: DC

## 2023-10-09 NOTE — Progress Notes (Addendum)
 Established patient visit  Patient: Sierra Brown   DOB: 24-Dec-1950   73 y.o. Female  MRN: [id number] Visit Date: 10/09/2023  Today's healthcare provider: Blane Bunting, PA-C   Chief Complaint  Patient presents with        Pt has sinus infection and was given meds. Still  incycle for Antibiotics.Pt wants something that will give an appetite due wt loss.   Subjective      Discussed the use of AI scribe software for clinical note transcription with the patient, who gave verbal consent to proceed.  History of Present Illness The patient, with a history of COPD, presents with bilateral hearing loss and sinus congestion. She was seen at a walk-in clinic five days ago and was prescribed a five-day course of prednisone  and antibiotics, which she has nearly completed. Despite this, she reports persistent bilateral hearing loss and pressure in her sinuses. She has been using a saline spray, which has resulted in some bloody discharge. She also reports a lack of appetite and weight loss down to ninety pounds. She denies pain, but describes a sensation of pressure in her sinuses. She also reports hoarseness and a previously scratchy throat, which has since resolved. She has been using an albuterol  inhaler and Trelegy for her COPD. She smokes less than a pack of cigarettes a day.       10/09/2023   10:09 AM 04/10/2023    9:38 AM 02/20/2023    2:59 PM  Depression screen PHQ 2/9  Decreased Interest 0 0 0  Down, Depressed, Hopeless 1 1 0  PHQ - 2 Score 1 1 0  Altered sleeping 0    Tired, decreased energy 3    Change in appetite 3    Feeling bad or failure about yourself  0    Trouble concentrating 0    Moving slowly or fidgety/restless 0    Suicidal thoughts 0    PHQ-9 Score 7    Difficult doing work/chores Not difficult at all        10/09/2023   10:09 AM 04/10/2023    9:38 AM  GAD 7 : Generalized Anxiety Score  Nervous, Anxious, on Edge 1 1  Control/stop worrying 1 1  Worry too  much - different things 1 1  Trouble relaxing 0 1  Restless 0 0  Easily annoyed or irritable 0 0  Afraid - awful might happen 0 0  Total GAD 7 Score 3 4  Anxiety Difficulty  Not difficult at all    Medications: Outpatient Medications Prior to Visit  Medication Sig   albuterol  (VENTOLIN  HFA) 108 (90 Base) MCG/ACT inhaler Inhale 2 puffs into the lungs every 6 (six) hours as needed for wheezing or shortness of breath. Given one refill - further needs needs to make appt.   aspirin  EC 81 MG tablet Take 1 tablet (81 mg total) by mouth daily. Swallow whole.   doxycycline  (VIBRAMYCIN ) 100 MG capsule Take 1 capsule (100 mg total) by mouth 2 (two) times daily for 7 days.   Fluticasone-Umeclidin-Vilant (TRELEGY ELLIPTA ) 100-62.5-25 MCG/ACT AEPB Inhale 1 puff into the lungs daily.   ipratropium-albuterol  (DUONEB) 0.5-2.5 (3) MG/3ML SOLN Take 3 mLs by nebulization every 6 (six) hours as needed.   Misc. Devices (FINGERTIP PULSE OXIMETER) MISC 1 Device by Does not apply route daily.   [EXPIRED] predniSONE  (DELTASONE ) 10 MG tablet Take 4 tablets (40 mg total) by mouth daily for 5 days.   rosuvastatin  (CRESTOR ) 40 MG tablet Take 1 tablet (40  mg total) by mouth daily.   sodium chloride  (OCEAN) 0.65 % SOLN nasal spray Place 1 spray into both nostrils as needed for congestion (nose irritation).   Facility-Administered Medications Prior to Visit  Medication Dose Route Frequency Provider   albuterol  (PROVENTIL ) (2.5 MG/3ML) 0.083% nebulizer solution 2.5 mg  2.5 mg Nebulization Once Wert, Michael B, MD    Review of Systems All negative Except see HPI       Objective    BP 139/69 (BP Location: Right Arm, Patient Position: Sitting, Cuff Size: Normal)   Pulse 83   Resp 16   Ht 5\' 3"  (1.6 m)   Wt 92 lb 12.8 oz (42.1 kg)   SpO2 95%   BMI 16.44 kg/m     Physical Exam Vitals reviewed.  Constitutional:      General: She is not in acute distress.    Appearance: Normal appearance. She is  well-developed and normal weight. She is not diaphoretic.  HENT:     Head: Normocephalic and atraumatic.     Right Ear: Ear canal and external ear normal. There is impacted cerumen.     Left Ear: Ear canal and external ear normal. There is impacted cerumen.     Nose: Congestion and rhinorrhea present.     Mouth/Throat:     Pharynx: Posterior oropharyngeal erythema present.     Comments: Postnasal drainage noted Eyes:     General: No scleral icterus.       Right eye: No discharge.        Left eye: No discharge.     Extraocular Movements: Extraocular movements intact.     Conjunctiva/sclera: Conjunctivae normal.     Pupils: Pupils are equal, round, and reactive to light.  Neck:     Thyroid : No thyromegaly.  Cardiovascular:     Rate and Rhythm: Normal rate and regular rhythm.     Pulses: Normal pulses.     Heart sounds: Normal heart sounds. No murmur heard. Pulmonary:     Effort: Pulmonary effort is normal. No respiratory distress.     Breath sounds: Normal breath sounds. No wheezing, rhonchi or rales.  Abdominal:     General: Abdomen is flat. Bowel sounds are normal.     Palpations: Abdomen is soft.  Musculoskeletal:     Cervical back: Neck supple.     Right lower leg: No edema.     Left lower leg: No edema.  Lymphadenopathy:     Cervical: No cervical adenopathy.  Skin:    General: Skin is warm and dry.     Findings: No rash.  Neurological:     Mental Status: She is alert and oriented to person, place, and time. Mental status is at baseline.  Psychiatric:        Mood and Affect: Mood normal.        Behavior: Behavior normal.      Results for orders placed or performed in visit on 10/09/23  Hemoglobin A1c  Result Value Ref Range   Hgb A1c MFr Bld 5.5 4.8 - 5.6 %   Est. average glucose Bld gHb Est-mCnc 111 mg/dL  Lipid panel  Result Value Ref Range   Cholesterol, Total 126 100 - 199 mg/dL   Triglycerides 161 (H) 0 - 149 mg/dL   HDL 67 >09 mg/dL   VLDL Cholesterol  Cal 26 5 - 40 mg/dL   LDL Chol Calc (NIH) 33 0 - 99 mg/dL   Chol/HDL Ratio 1.9 0.0 - 4.4 ratio  Comprehensive  metabolic panel with GFR  Result Value Ref Range   Glucose 91 70 - 99 mg/dL   BUN 23 8 - 27 mg/dL   Creatinine, Ser 1.61 0.57 - 1.00 mg/dL   eGFR 75 >09 UE/AVW/0.98   BUN/Creatinine Ratio 27 12 - 28   Sodium 139 134 - 144 mmol/L   Potassium 4.6 3.5 - 5.2 mmol/L   Chloride 100 96 - 106 mmol/L   CO2 23 20 - 29 mmol/L   Calcium  10.0 8.7 - 10.3 mg/dL   Total Protein 6.5 6.0 - 8.5 g/dL   Albumin 4.4 3.9 - 4.9 g/dL   Globulin, Total 2.1 1.5 - 4.5 g/dL   Bilirubin Total 0.6 0.0 - 1.2 mg/dL   Alkaline Phosphatase 103 44 - 121 IU/L   AST 29 0 - 40 IU/L   ALT 34 (H) 0 - 32 IU/L  CBC with Differential/Platelet  Result Value Ref Range   WBC 14.9 (H) 3.4 - 10.8 x10E3/uL   RBC 4.50 3.77 - 5.28 x10E6/uL   Hemoglobin 14.6 11.1 - 15.9 g/dL   Hematocrit 11.9 14.7 - 46.6 %   MCV 94 79 - 97 fL   MCH 32.4 26.6 - 33.0 pg   MCHC 34.4 31.5 - 35.7 g/dL   RDW 82.9 56.2 - 13.0 %   Platelets 307 150 - 450 x10E3/uL   Neutrophils 79 Not Estab. %   Lymphs 11 Not Estab. %   Monocytes 9 Not Estab. %   Eos 0 Not Estab. %   Basos 0 Not Estab. %   Neutrophils Absolute 11.9 (H) 1.4 - 7.0 x10E3/uL   Lymphocytes Absolute 1.6 0.7 - 3.1 x10E3/uL   Monocytes Absolute 1.4 (H) 0.1 - 0.9 x10E3/uL   EOS (ABSOLUTE) 0.0 0.0 - 0.4 x10E3/uL   Basophils Absolute 0.0 0.0 - 0.2 x10E3/uL   Immature Granulocytes 1 Not Estab. %   Immature Grans (Abs) 0.1 0.0 - 0.1 x10E3/uL  TSH  Result Value Ref Range   TSH 1.080 0.450 - 4.500 uIU/mL  C-reactive protein  Result Value Ref Range   CRP 5 0 - 10 mg/L  Prealbumin  Result Value Ref Range   PREALBUMIN 23 10 - 36 mg/dL        Assessment & Plan Sinusitis   Persistent hearing issues and lymphadenopathy, possibly related to smoking or viral infection.   - Prescribe Flonase nasal spray.   - Recommend Claritin in the morning.   - Refer to ENT for ear wax removal  and lymph node evaluation.   - Order ultrasound of lymph nodes.    Chronic Obstructive Pulmonary Disease (COPD)   COPD managed with inhalers. Symptoms improved with antibiotics and prednisone  for sinusitis. Continued Trelegy emphasized for lung function.   - Continue Trelegy inhaler.   - Continue albuterol  inhaler as needed.   - Add nasal spray for congestion.    Decreased Appetite   Significant appetite decrease and weight loss. Potential link to depression. Mirtazapine  considered for appetite and mood.   - Prescribe mirtazapine  at bedtime for appetite and mood.   - Encourage protein shakes to supplement nutrition.    Tobacco abuse Chronic 1/2 pack per day Cessation advised Will revisit at next appointment  Other sinusitis, unspecified chronicity (Primary) - azelastine  (ASTELIN ) 0.1 % nasal spray; Place 1 spray into both nostrils 2 (two) times daily. Use in each nostril as directed  Dispense: 30 mL; Refill: 5  Other fatigue Chronic Initial workup - Hemoglobin A1c - Lipid panel - Comprehensive metabolic  panel with GFR - CBC with Differential/Platelet - TSH Will reassess after  receiving lab results  Ceruminosis, bilateral Other specified hearing loss of both ears Unsuccessful cleaning of wax at home. Needs some assistance - Ambulatory referral to ENT Will follow-up  Underweight Chronic with Body mass index is 16.44 kg/m. A trial of remeron  was ordered - mirtazapine  (REMERON ) 7.5 MG tablet; Take 1 tablet (7.5 mg total) by mouth at bedtime.  Dispense: 30 tablet; Refill: 1 - Prealbumin - C-reactive protein - C-reactive protein - Prealbumin Will reassess after  receiving lab results  General Health Maintenance   Due for routine screenings and vaccinations.   - Schedule mammogram.   - Schedule lung screening.   - Schedule colonoscopy.   - Administer shingles vaccine.    Follow-up   Advised to monitor symptoms and return if she worsens.   - Schedule follow-up  appointment in one month for physical examination.   - Advise to return sooner if symptoms worsen.  Orders Placed This Encounter  Procedures   Hemoglobin A1c   Lipid panel    Has the patient fasted?:   Yes   Comprehensive metabolic panel with GFR    Has the patient fasted?:   Yes   CBC with Differential/Platelet   TSH   Prealbumin   C-reactive protein   C-reactive protein   Prealbumin   Ambulatory referral to ENT    Referral Priority:   Routine    Referral Type:   Consultation    Referral Reason:   Specialty Services Required    Requested Specialty:   Otolaryngology    Number of Visits Requested:   1    Return in about 4 weeks (around 11/06/2023) for CPE.   The patient was advised to call back or seek an in-person evaluation if the symptoms worsen or if the condition fails to improve as anticipated.  I discussed the assessment and treatment plan with the patient. The patient was provided an opportunity to ask questions and all were answered. The patient agreed with the plan and demonstrated an understanding of the instructions.  I, Madex Seals, PA-C have reviewed all documentation for this visit. The documentation on 10/09/2023  for the exam, diagnosis, procedures, and orders are all accurate and complete.  Blane Bunting, [facility], MMS [facility] [phone] (phone) [phone] (fax)  [facility]

## 2023-10-10 DIAGNOSIS — R636 Underweight: Secondary | ICD-10-CM | POA: Insufficient documentation

## 2023-10-10 DIAGNOSIS — H6123 Impacted cerumen, bilateral: Secondary | ICD-10-CM | POA: Insufficient documentation

## 2023-10-10 DIAGNOSIS — R5383 Other fatigue: Secondary | ICD-10-CM | POA: Insufficient documentation

## 2023-10-10 DIAGNOSIS — Z720 Tobacco use: Secondary | ICD-10-CM | POA: Insufficient documentation

## 2023-10-10 DIAGNOSIS — J441 Chronic obstructive pulmonary disease with (acute) exacerbation: Secondary | ICD-10-CM | POA: Insufficient documentation

## 2023-10-10 DIAGNOSIS — H9190 Unspecified hearing loss, unspecified ear: Secondary | ICD-10-CM | POA: Insufficient documentation

## 2023-10-10 LAB — PREALBUMIN: PREALBUMIN: 23 mg/dL (ref 10–36)

## 2023-10-10 LAB — COMPREHENSIVE METABOLIC PANEL WITH GFR
ALT: 34 IU/L — ABNORMAL HIGH (ref 0–32)
AST: 29 IU/L (ref 0–40)
Albumin: 4.4 g/dL (ref 3.9–4.9)
Alkaline Phosphatase: 103 IU/L (ref 44–121)
BUN/Creatinine Ratio: 27 (ref 12–28)
BUN: 23 mg/dL (ref 8–27)
Bilirubin Total: 0.6 mg/dL (ref 0.0–1.2)
CO2: 23 mmol/L (ref 20–29)
Calcium: 10 mg/dL (ref 8.7–10.3)
Chloride: 100 mmol/L (ref 96–106)
Creatinine, Ser: 0.84 mg/dL (ref 0.57–1.00)
Globulin, Total: 2.1 g/dL (ref 1.5–4.5)
Glucose: 91 mg/dL (ref 70–99)
Potassium: 4.6 mmol/L (ref 3.5–5.2)
Sodium: 139 mmol/L (ref 134–144)
Total Protein: 6.5 g/dL (ref 6.0–8.5)
eGFR: 75 mL/min/{1.73_m2} (ref >59)

## 2023-10-10 LAB — LIPID PANEL
Chol/HDL Ratio: 1.9 ratio (ref 0.0–4.4)
Cholesterol, Total: 126 mg/dL (ref 100–199)
HDL: 67 mg/dL (ref >39)
LDL Chol Calc (NIH): 33 mg/dL (ref 0–99)
Triglycerides: 159 mg/dL — ABNORMAL HIGH (ref 0–149)
VLDL Cholesterol Cal: 26 mg/dL (ref 5–40)

## 2023-10-10 LAB — CBC WITH DIFFERENTIAL/PLATELET
Basophils Absolute: 0 10*3/uL (ref 0.0–0.2)
Basos: 0 %
EOS (ABSOLUTE): 0 10*3/uL (ref 0.0–0.4)
Eos: 0 %
Hematocrit: 42.4 % (ref 34.0–46.6)
Hemoglobin: 14.6 g/dL (ref 11.1–15.9)
Immature Grans (Abs): 0.1 10*3/uL (ref 0.0–0.1)
Immature Granulocytes: 1 %
Lymphocytes Absolute: 1.6 10*3/uL (ref 0.7–3.1)
Lymphs: 11 %
MCH: 32.4 pg (ref 26.6–33.0)
MCHC: 34.4 g/dL (ref 31.5–35.7)
MCV: 94 fL (ref 79–97)
Monocytes Absolute: 1.4 10*3/uL — ABNORMAL HIGH (ref 0.1–0.9)
Monocytes: 9 %
Neutrophils Absolute: 11.9 10*3/uL — ABNORMAL HIGH (ref 1.4–7.0)
Neutrophils: 79 %
Platelets: 307 10*3/uL (ref 150–450)
RBC: 4.5 x10E6/uL (ref 3.77–5.28)
RDW: 12 % (ref 11.7–15.4)
WBC: 14.9 10*3/uL — ABNORMAL HIGH (ref 3.4–10.8)

## 2023-10-10 LAB — C-REACTIVE PROTEIN: CRP: 5 mg/L (ref 0–10)

## 2023-10-10 LAB — TSH: TSH: 1.08 u[IU]/mL (ref 0.450–4.500)

## 2023-10-10 LAB — HEMOGLOBIN A1C
Est. average glucose Bld gHb Est-mCnc: 111 mg/dL
Hgb A1c MFr Bld: 5.5 % (ref 4.8–5.6)

## 2023-10-17 ENCOUNTER — Encounter: Payer: Self-pay | Admitting: Physician Assistant

## 2023-10-31 DIAGNOSIS — H903 Sensorineural hearing loss, bilateral: Secondary | ICD-10-CM | POA: Diagnosis not present

## 2023-10-31 DIAGNOSIS — H6123 Impacted cerumen, bilateral: Secondary | ICD-10-CM | POA: Diagnosis not present

## 2023-11-16 ENCOUNTER — Other Ambulatory Visit: Payer: Self-pay | Admitting: Family Medicine

## 2023-11-16 DIAGNOSIS — J441 Chronic obstructive pulmonary disease with (acute) exacerbation: Secondary | ICD-10-CM

## 2023-11-17 ENCOUNTER — Encounter: Admitting: Physician Assistant

## 2023-11-17 NOTE — Telephone Encounter (Signed)
 Requested Prescriptions  Pending Prescriptions Disp Refills   albuterol  (VENTOLIN  HFA) 108 (90 Base) MCG/ACT inhaler [Pharmacy Med Name: ALBUTEROL  HFA (PROVENTIL ) INH] 6.7 each 1    Sig: INHALE 2 PUFFS INTO THE LUNGS EVERY 6 (SIX) HOURS AS NEEDED FOR WHEEZING OR SHORTNESS OF BREATH. GIVEN ONE REFILL - FURTHER NEEDS NEEDS TO MAKE APPT.     Pulmonology:  [facility] 2 Passed - 11/17/2023  8:58 AM      Passed - Last BP in normal range    BP Readings from Last 1 Encounters:  10/09/23 139/69         Passed - Last Heart Rate in normal range    Pulse Readings from Last 1 Encounters:  10/09/23 83         Passed - Valid encounter within last 12 months    Recent Outpatient Visits           1 month ago Other sinusitis, unspecified chronicity   [facility] [place], [place], PA-C       Future Appointments             In 2 weeks Ostwalt, Janna, PA-C [facility], [facility]

## 2023-12-04 ENCOUNTER — Ambulatory Visit (INDEPENDENT_AMBULATORY_CARE_PROVIDER_SITE_OTHER): Admitting: Physician Assistant

## 2023-12-04 ENCOUNTER — Encounter: Payer: Self-pay | Admitting: Physician Assistant

## 2023-12-04 VITALS — BP 123/65 | HR 74 | Resp 16 | Ht 65.0 in | Wt 94.0 lb

## 2023-12-04 DIAGNOSIS — J329 Chronic sinusitis, unspecified: Secondary | ICD-10-CM

## 2023-12-04 DIAGNOSIS — Z0001 Encounter for general adult medical examination with abnormal findings: Secondary | ICD-10-CM

## 2023-12-04 DIAGNOSIS — Z720 Tobacco use: Secondary | ICD-10-CM

## 2023-12-04 DIAGNOSIS — R636 Underweight: Secondary | ICD-10-CM | POA: Diagnosis not present

## 2023-12-04 DIAGNOSIS — J441 Chronic obstructive pulmonary disease with (acute) exacerbation: Secondary | ICD-10-CM | POA: Diagnosis not present

## 2023-12-04 DIAGNOSIS — I8393 Asymptomatic varicose veins of bilateral lower extremities: Secondary | ICD-10-CM | POA: Diagnosis not present

## 2023-12-04 DIAGNOSIS — I10 Essential (primary) hypertension: Secondary | ICD-10-CM

## 2023-12-04 DIAGNOSIS — R252 Cramp and spasm: Secondary | ICD-10-CM | POA: Diagnosis not present

## 2023-12-04 DIAGNOSIS — Z0000 Encounter for general adult medical examination without abnormal findings: Secondary | ICD-10-CM

## 2023-12-04 NOTE — Progress Notes (Signed)
 Complete physical exam  Patient: Sierra Brown   DOB: 09-12-1950   73 y.o. Female  MRN: [id number] Visit Date: 12/04/2023  Today's healthcare provider: Blane Bunting, PA-C   Chief Complaint  Patient presents with   Follow-up    4 wk f/u    Annual Exam    CPE( Lung Screening/ Mammo)    Subjective    Sierra Brown is a 73 y.o. female who presents today for a complete physical exam.   Discussed the use of AI scribe software for clinical note transcription with the patient, who gave verbal consent to proceed.  History of Present Illness Sierra Brown is a 73 year old female with COPD who presents with sinus issues and medication side effects.  She has persistent sinus congestion, particularly on one side, despite previous treatment. She uses azelastine  nasal spray twice daily. No new medications have been prescribed for this issue.  She experiences leg and foot cramps, especially at night, which improved after discontinuing lovastatin. She is considering reducing the dose from 40 mg to 20 mg to monitor for recurrence of cramps.  She has COPD and is concerned about potential interactions with antihistamines. She denies chest pain, shortness of breath, or palpitations.  She smokes approximately ten cigarettes a day and is trying to gain weight by consuming protein shakes in addition to regular meals.    Last depression screening scores    10/09/2023   10:09 AM 04/10/2023    9:38 AM 02/20/2023    2:59 PM  PHQ 2/9 Scores  PHQ - 2 Score 1 1 0  PHQ- 9 Score 7     Last fall risk screening    04/10/2023    9:37 AM  Fall Risk   Falls in the past year? 0  Injury with Fall? 0  Risk for fall due to : No Fall Risks  Follow up Falls evaluation completed   Last Audit-C alcohol use screening    02/20/2023    2:59 PM  Alcohol Use Disorder Test (AUDIT)  1. How often do you have a drink containing alcohol? 0  2. How many drinks containing alcohol do you have on a  typical day when you are drinking? 0  3. How often do you have six or more drinks on one occasion? 0  AUDIT-C Score 0   A score of 3 or more in women, and 4 or more in men indicates increased risk for alcohol abuse, EXCEPT if all of the points are from question 1   Past Medical History:  Diagnosis Date   Allergy    allerlgic rhinitis   Cataract    COPD (chronic obstructive pulmonary disease) (HCC)    COVID    Pneumonia    Tobacco use disorder    Past Surgical History:  Procedure Laterality Date   CESAREAN SECTION     TONSILLECTOMY     Social History   Socioeconomic History   Marital status: Married    Spouse name: Not on file   Number of children: Not on file   Years of education: Not on file   Highest education level: Not on file  Occupational History   Not on file  Tobacco Use   Smoking status: Every Day    Current packs/day: 0.75    Average packs/day: 0.8 packs/day for 49.0 years (36.8 ttl pk-yrs)    Types: Cigarettes   Smokeless tobacco: Never   Tobacco comments:    1-2 cigarettes daily-  10/14/2022  Vaping Use   Vaping status: Never Used  Substance and Sexual Activity   Alcohol use: Never   Drug use: Never   Sexual activity: Not on file  Other Topics Concern   Not on file  Social History Narrative   Not on file   Social Drivers of Health   Financial Resource Strain: Low Risk  (02/20/2023)   Overall Financial Resource Strain (CARDIA)    Difficulty of Paying Living Expenses: Not hard at all  Food Insecurity: No Food Insecurity (02/20/2023)   Hunger Vital Sign    Worried About Running Out of Food in the Last Year: Never true    Ran Out of Food in the Last Year: Never true  Transportation Needs: No Transportation Needs (02/20/2023)   PRAPARE - Administrator, Civil Service (Medical): No    Lack of Transportation (Non-Medical): No  Physical Activity: Sufficiently Active (02/20/2023)   Exercise Vital Sign    Days of Exercise per Week: 5 days     Minutes of Exercise per Session: 30 min  Stress: No Stress Concern Present (02/20/2023)   [employer] of Occupational Health - Occupational Stress Questionnaire    Feeling of Stress : Not at all  Social Connections: Moderately Integrated (02/20/2023)   Social Connection and Isolation Panel [NHANES]    Frequency of Communication with Friends and Family: More than three times a week    Frequency of Social Gatherings with Friends and Family: More than three times a week    Attends Religious Services: More than 4 times per year    Active Member of [employer] or Organizations: No    Attends Banker Meetings: Never    Marital Status: Married  Catering manager Violence: Not At Risk (02/20/2023)   Humiliation, Afraid, Rape, and Kick questionnaire    Fear of Current or Ex-Partner: No    Emotionally Abused: No    Physically Abused: No    Sexually Abused: No   Family Status  Relation Name Status   Mother  (Not Specified)       colostomy   Neg Hx  (Not Specified)  No partnership data on file   Family History  Problem Relation Age of Onset   Ulcerative colitis Mother        colostomy   Hypertension Mother    Heart Problems Mother    Breast cancer Neg Hx    Allergies  Allergen Reactions   Atorvastatin  Other (See Comments)    Myalgias   Codeine Nausea And Vomiting   Levofloxacin Nausea And Vomiting   Sulfonamide Derivatives Hives   Penicillins Hives and Rash   Sulfa Antibiotics Hives and Rash    Patient Care Team: Cambree Hendrix, PA-C as PCP - General (Physician Assistant) Hazle Lites, MD as PCP - Cardiology (Cardiology)   Medications: Outpatient Medications Prior to Visit  Medication Sig   albuterol  (VENTOLIN  HFA) 108 (90 Base) MCG/ACT inhaler INHALE 2 PUFFS INTO THE LUNGS EVERY 6 (SIX) HOURS AS NEEDED FOR WHEEZING OR SHORTNESS OF BREATH. GIVEN ONE REFILL - FURTHER NEEDS NEEDS TO MAKE APPT.   aspirin  EC 81 MG tablet Take 1 tablet (81 mg total) by mouth daily.  Swallow whole.   azelastine  (ASTELIN ) 0.1 % nasal spray Place 1 spray into both nostrils 2 (two) times daily. Use in each nostril as directed   Fluticasone-Umeclidin-Vilant (TRELEGY ELLIPTA ) 100-62.5-25 MCG/ACT AEPB Inhale 1 puff into the lungs daily.   ipratropium-albuterol  (DUONEB) 0.5-2.5 (3) MG/3ML SOLN Take  3 mLs by nebulization every 6 (six) hours as needed.   mirtazapine  (REMERON ) 7.5 MG tablet Take 1 tablet (7.5 mg total) by mouth at bedtime.   Misc. Devices (FINGERTIP PULSE OXIMETER) MISC 1 Device by Does not apply route daily.   rosuvastatin  (CRESTOR ) 40 MG tablet Take 1 tablet (40 mg total) by mouth daily.   sodium chloride  (OCEAN) 0.65 % SOLN nasal spray Place 1 spray into both nostrils as needed for congestion (nose irritation).   Facility-Administered Medications Prior to Visit  Medication Dose Route Frequency Provider   albuterol  (PROVENTIL ) (2.5 MG/3ML) 0.083% nebulizer solution 2.5 mg  2.5 mg Nebulization Once Wert, Michael B, MD    Review of Systems  All other systems reviewed and are negative.  Except see HPI     Objective    BP 123/65 (BP Location: Right Arm, Patient Position: Sitting)   Pulse 74   Resp 16   Ht 5\' 5"  (1.651 m)   Wt 94 lb (42.6 kg)   BMI 15.64 kg/m      Physical Exam Vitals reviewed.  Constitutional:      General: She is not in acute distress.    Appearance: Normal appearance. She is well-developed. She is not ill-appearing, toxic-appearing or diaphoretic.  HENT:     Head: Normocephalic and atraumatic.     Right Ear: Tympanic membrane, ear canal and external ear normal.     Left Ear: Tympanic membrane, ear canal and external ear normal.     Nose: Nose normal. No congestion or rhinorrhea.     Mouth/Throat:     Mouth: Mucous membranes are moist.     Pharynx: Oropharynx is clear. No oropharyngeal exudate.  Eyes:     General: No scleral icterus.       Right eye: No discharge.        Left eye: No discharge.     Conjunctiva/sclera:  Conjunctivae normal.     Pupils: Pupils are equal, round, and reactive to light.  Neck:     Thyroid : No thyromegaly.     Vascular: No carotid bruit.  Cardiovascular:     Rate and Rhythm: Normal rate and regular rhythm.     Pulses: Normal pulses.     Heart sounds: Normal heart sounds. No murmur heard.    No friction rub. No gallop.  Pulmonary:     Effort: Pulmonary effort is normal. No respiratory distress.     Breath sounds: Normal breath sounds. No wheezing or rales.  Abdominal:     General: Abdomen is flat. Bowel sounds are normal. There is no distension.     Palpations: Abdomen is soft. There is no mass.     Tenderness: There is no abdominal tenderness. There is no right CVA tenderness, left CVA tenderness, guarding or rebound.     Hernia: No hernia is present.  Musculoskeletal:        General: No swelling, tenderness, deformity or signs of injury. Normal range of motion.     Cervical back: Normal range of motion and neck supple. No rigidity or tenderness.     Right lower leg: No edema.     Left lower leg: No edema.  Lymphadenopathy:     Cervical: No cervical adenopathy.  Skin:    General: Skin is warm and dry.     Coloration: Skin is not jaundiced or pale.     Findings: No bruising, erythema, lesion or rash.  Neurological:     Mental Status: She is alert and oriented  to person, place, and time. Mental status is at baseline.     Gait: Gait normal.  Psychiatric:        Mood and Affect: Mood normal.        Behavior: Behavior normal.        Thought Content: Thought content normal.        Judgment: Judgment normal.      No results found for any visits on 12/04/23.  Assessment & Plan    Routine Health Maintenance and Physical Exam  Exercise Activities and Dietary recommendations  Goals      DIET - EAT MORE FRUITS AND VEGETABLES        Immunization History  Administered Date(s) Administered   Fluad Quad(high Dose 65+) 04/27/2022   Influenza Whole 03/28/2007,  04/08/2010   Influenza-Unspecified 03/27/2013, 04/27/2021, 05/01/2023   PNEUMOCOCCAL CONJUGATE-20 08/25/2021   Pneumococcal Polysaccharide-23 08/01/2007   Td 06/27/1997    Health Maintenance  Topic Date Due   Colon Cancer Screening  Never done   Zoster (Shingles) Vaccine (1 of 2) Never done   DTaP/Tdap/Td vaccine (2 - Tdap) 06/28/2007   COVID-19 Vaccine (1 - 2024-25 season) Never done   Screening for Lung Cancer  11/08/2023   Mammogram  11/16/2023   Flu Shot  01/26/2024   Medicare Annual Wellness Visit  02/20/2024   Pneumonia Vaccine  Completed   DEXA scan (bone density measurement)  Completed   HPV Vaccine  Aged Out   Meningitis B Vaccine  Aged Out   Hepatitis C Screening  Discontinued    Discussed health benefits of physical activity, and encouraged her to engage in regular exercise appropriate for her age and condition.  Assessment & Plan Hearing Loss Scheduled to receive hearing aids next week. - Proceed with obtaining hearing aids next week.  Chronic Sinusitis Persistent symptoms despite previous treatment. ENT consultation did not result in additional treatment beyond nasal spray. - Continue azelastine  nasal spray twice daily. - Add saline nasal spray. - Start Claritin, preferably in the morning.  Chronic Obstructive Pulmonary Disease (COPD) No current exacerbation or respiratory distress.  Continue current inhalers.  Hyperlipidemia Chronic Elevated liver enzymes and leg cramps potentially related to atorvastatin . Stopped lovastatin with resolution of leg cramps. Plan to reduce atorvastatin  dose and monitor lipid levels. - Reduce atorvastatin  dose to 20 mg. - Recheck lipid levels. - Consider alternative medication if symptoms persist. - Consider Coenzyme Q10 supplementation.  Varicose Veins Chronic Reports discomfort with compression stockings in the summer. - Advise use of compression stockings. - Encourage leg elevation when sitting or resting. Consider  vascular consultation  General Health Maintenance Due for eye exam. No recent dental visit. Smoker. Discussed weight management and protein shake use. - Schedule eye exam in August or September. - Encourage dental visit. - Discuss smoking cessation. - Monitor weight and nutritional intake.  Follow-up To follow up in one month for chronic conditions and in one year for annual physical. - Schedule follow-up appointment in one month for chronic conditions. - Schedule annual physical in one year.   Primary hypertension Chronic and stable Continue current regimen Will follow-up   Annual physical exam (Primary) uTD on dental/eye Things to do to keep yourself healthy  - Exercise at least 30-45 minutes a day, 3-4 days a week.  - Eat a low-fat diet with lots of fruits and vegetables, up to 7-9 servings per day.  - Seatbelts can save your life. Wear them always.  - Smoke detectors on every level of your  home, check batteries every year.  - Eye Doctor - have an eye exam every 1-2 years  - Safe sex - if you may be exposed to STDs, use a condom.  - Alcohol -  If you drink, do it moderately, less than 2 drinks per day.  - Health Care Power of Attorney. Choose someone to speak for you if you are not able.  - Depression is common in our stressful world.If you're feeling down or losing interest in things you normally enjoy, please come in for a visit.  - Violence - If anyone is threatening or hurting you, please call immediately.   Muscle cramps  - CK (Creatine Kinase) - Basic metabolic panel with GFR   Return for chronic disease f/u in 4 weeks and CPE in a year.    The patient was advised to call back or seek an in-person evaluation if the symptoms worsen or if the condition fails to improve as anticipated.  I discussed the assessment and treatment plan with the patient. The patient was provided an opportunity to ask questions and all were answered. The patient agreed with the plan and  demonstrated an understanding of the instructions.  I, Jakobee Brackins, PA-C have reviewed all documentation for this visit. The documentation on 12/04/2023  for the exam, diagnosis, procedures, and orders are all accurate and complete.  Blane Bunting, [facility], MMS [facility] [phone] (phone) [phone] (fax)  [facility]

## 2023-12-05 ENCOUNTER — Ambulatory Visit: Payer: Self-pay | Admitting: Physician Assistant

## 2023-12-05 LAB — BASIC METABOLIC PANEL WITH GFR
BUN/Creatinine Ratio: 28 (ref 12–28)
BUN: 21 mg/dL (ref 8–27)
CO2: 20 mmol/L (ref 20–29)
Calcium: 9.7 mg/dL (ref 8.7–10.3)
Chloride: 104 mmol/L (ref 96–106)
Creatinine, Ser: 0.75 mg/dL (ref 0.57–1.00)
Glucose: 91 mg/dL (ref 70–99)
Potassium: 4.4 mmol/L (ref 3.5–5.2)
Sodium: 140 mmol/L (ref 134–144)
eGFR: 86 mL/min/{1.73_m2} (ref >59)

## 2023-12-05 LAB — CK: Total CK: 73 U/L (ref 32–182)

## 2023-12-20 ENCOUNTER — Encounter: Payer: Self-pay | Admitting: Emergency Medicine

## 2023-12-20 ENCOUNTER — Ambulatory Visit: Payer: Self-pay

## 2023-12-20 ENCOUNTER — Ambulatory Visit
Admission: EM | Admit: 2023-12-20 | Discharge: 2023-12-20 | Disposition: A | Discharge status: Home / Self Care | Attending: Emergency Medicine | Admitting: Emergency Medicine

## 2023-12-20 DIAGNOSIS — J019 Acute sinusitis, unspecified: Secondary | ICD-10-CM | POA: Diagnosis not present

## 2023-12-20 MED ORDER — DOXYCYCLINE HYCLATE 100 MG PO CAPS: 100.0000 mg | ORAL_CAPSULE | Freq: Two times a day (BID) | ORAL | 0 refills | Status: DC

## 2023-12-20 MED ORDER — PREDNISONE 10 MG (21) PO TBPK: ORAL_TABLET | Freq: Every day | ORAL | 0 refills | Status: DC

## 2023-12-20 NOTE — Discharge Instructions (Signed)
 Today you are being treated for sinus infection  Take doxycycline  twice daily for 10 days  Begin prednisone  every morning with food as directed to reduce sinus pressure  Continue use of Claritin and nasal spray for further cough   For cough: honey 1/2 to 1 teaspoon (you can dilute the honey in water or another fluid).  You can also use guaifenesin  and dextromethorphan  for cough. You can use a humidifier for chest congestion and cough.  If you don't have a humidifier, you can sit in the bathroom with the hot shower running.      For sore throat: try warm salt water gargles, cepacol lozenges, throat spray, warm tea or water with lemon/honey, popsicles or ice, or OTC cold relief medicine for throat discomfort.   For congestion: take a daily anti-histamine like Zyrtec , Claritin, and a oral decongestant, such as pseudoephedrine.  You can also use Flonase 1-2 sprays in each nostril daily.   It is important to stay hydrated: drink plenty of fluids (water, gatorade/powerade/pedialyte, juices, or teas) to keep your throat moisturized and help further relieve irritation/discomfort.

## 2023-12-20 NOTE — ED Provider Notes (Signed)
 Sierra Brown    CSN: [id number] Arrival date & time: 12/20/23  9063      History   Chief Complaint Chief Complaint  Patient presents with   Facial Pain    HPI ROSIO WEISS is a 73 y.o. female.   Patient presents for evaluation of sinus drainage, nasal congestion, sinus pressure to the cheeks and behind the eyes, intermittent headaches, bilateral ear fullness and pressure, a scratchy throat and intermittent swollen lymph nodes present for 2 weeks.  Has been evaluated by PCP and ear nose and throat, instructed to use antihistamines and nasal spray which she has been doing however ineffective.  Denies cough, shortness of breath or wheeze.  Denies fever.  Past Medical History:  Diagnosis Date   Allergy    allerlgic rhinitis   Cataract    COPD (chronic obstructive pulmonary disease) (HCC)    COVID    Pneumonia    Tobacco use disorder     Patient Active Problem List   Diagnosis Date Noted   Varicose veins of both lower extremities 12/04/2023   Muscle cramps 12/04/2023   Ceruminosis, bilateral 10/10/2023   Hearing loss 10/10/2023   Underweight 10/10/2023   Tobacco abuse 10/10/2023   Chronic obstructive pulmonary disease with acute exacerbation (HCC) 10/10/2023   Other fatigue 10/10/2023   Mixed simple and mucopurulent chronic bronchitis (HCC) 04/10/2023   Abnormal glucose 04/10/2023   Adjustment disorder with anxious mood 04/10/2023   COPD GOLD ? 09/02/2022   History of TIA (transient ischemic attack) 01/25/2022   Age-related osteoporosis without current pathological fracture 12/01/2021   Mildly underweight adult 12/01/2021   Annual physical exam 08/25/2021   Aortic atherosclerosis (HCC) 08/25/2021   Hypertension 01/20/2021   Coronary artery calcification seen on CT scan 09/16/2020    Past Surgical History:  Procedure Laterality Date   CESAREAN SECTION     TONSILLECTOMY      OB History   No obstetric history on file.      Home Medications     Prior to Admission medications   Medication Sig Start Date End Date Taking? Authorizing Provider  doxycycline  (VIBRAMYCIN ) 100 MG capsule Take 1 capsule (100 mg total) by mouth 2 (two) times daily. 12/20/23  Yes Klynn Linnemann R, NP  predniSONE  (STERAPRED UNI-PAK 21 TAB) 10 MG (21) TBPK tablet Take by mouth daily. Take 6 tabs by mouth daily  for 1 days, then 5 tabs for 1 days, then 4 tabs for 1 days, then 3 tabs for 1 days, 2 tabs for 1 days, then 1 tab by mouth daily for 1 days 12/20/23  Yes Zephyr Ridley R, NP  albuterol  (VENTOLIN  HFA) 108 (90 Base) MCG/ACT inhaler INHALE 2 PUFFS INTO THE LUNGS EVERY 6 (SIX) HOURS AS NEEDED FOR WHEEZING OR SHORTNESS OF BREATH. GIVEN ONE REFILL - FURTHER NEEDS NEEDS TO MAKE APPT. 11/17/23   Ostwalt, Janna, PA-C  aspirin  EC 81 MG tablet Take 1 tablet (81 mg total) by mouth daily. Swallow whole. 01/25/22   Emilio Kelly DASEN, FNP  azelastine  (ASTELIN ) 0.1 % nasal spray Place 1 spray into both nostrils 2 (two) times daily. Use in each nostril as directed 10/09/23   Ostwalt, Janna, PA-C  Fluticasone-Umeclidin-Vilant (TRELEGY ELLIPTA ) 100-62.5-25 MCG/ACT AEPB Inhale 1 puff into the lungs daily. 07/21/23   Isadora Hose, MD  ipratropium-albuterol  (DUONEB) 0.5-2.5 (3) MG/3ML SOLN Take 3 mLs by nebulization every 6 (six) hours as needed. 07/21/23   Isadora Hose, MD  mirtazapine  (REMERON ) 7.5 MG tablet Take 1  tablet (7.5 mg total) by mouth at bedtime. 10/09/23   Ostwalt, Janna, PA-C  Misc. Devices (FINGERTIP PULSE OXIMETER) MISC 1 Device by Does not apply route daily. 07/23/22   Clide Burnard Ee, MD  rosuvastatin  (CRESTOR ) 40 MG tablet Take 1 tablet (40 mg total) by mouth daily. 07/12/23   Simmons-Robinson, Rockie, MD  sodium chloride  (OCEAN) 0.65 % SOLN nasal spray Place 1 spray into both nostrils as needed for congestion (nose irritation). 08/03/22   Tobie Calix, MD    Family History Family History  Problem Relation Age of Onset   Ulcerative colitis Mother        colostomy    Hypertension Mother    Heart Problems Mother    Breast cancer Neg Hx     Social History Social History   Tobacco Use   Smoking status: Every Day    Current packs/day: 0.75    Average packs/day: 0.8 packs/day for 49.0 years (36.8 ttl pk-yrs)    Types: Cigarettes   Smokeless tobacco: Never   Tobacco comments:    1-2 cigarettes daily- 10/14/2022  Vaping Use   Vaping status: Never Used  Substance Use Topics   Alcohol use: Never   Drug use: Never     Allergies   Atorvastatin , Codeine, Levofloxacin, Sulfonamide derivatives, Penicillins, and Sulfa antibiotics   Review of Systems Review of Systems   Physical Exam Triage Vital Signs ED Triage Vitals  Encounter Vitals Group     BP 12/20/23 1010 (!) 110/59     Girls Systolic BP Percentile --      Girls Diastolic BP Percentile --      Boys Systolic BP Percentile --      Boys Diastolic BP Percentile --      Pulse Rate 12/20/23 1010 92     Resp 12/20/23 1010 18     Temp 12/20/23 1010 98.8 F (37.1 C)     Temp Source 12/20/23 1010 Oral     SpO2 12/20/23 1010 96 %     Weight --      Height --      Head Circumference --      Peak Flow --      Pain Score 12/20/23 1018 3     Pain Loc --      Pain Education --      Exclude from Growth Chart --    No data found.  Updated Vital Signs BP (!) 110/59 (BP Location: Left Arm)   Pulse 92   Temp 98.8 F (37.1 C) (Oral)   Resp 18   SpO2 96%   Visual Acuity Right Eye Distance:   Left Eye Distance:   Bilateral Distance:    Right Eye Near:   Left Eye Near:    Bilateral Near:     Physical Exam Constitutional:      Appearance: Normal appearance.  HENT:     Head: Normocephalic.     Right Ear: A middle ear effusion is present.     Left Ear: A middle ear effusion is present.     Nose:     Right Sinus: Maxillary sinus tenderness and frontal sinus tenderness present.     Left Sinus: Maxillary sinus tenderness and frontal sinus tenderness present.   Neurological:      Mental Status: She is alert.      UC Treatments / Results  Labs (all labs ordered are listed, but only abnormal results are displayed) Labs Reviewed - No data to display  EKG  Radiology No results found.  Procedures Procedures (including critical care time)  Medications Ordered in UC Medications - No data to display  Initial Impression / Assessment and Plan / UC Course  I have reviewed the triage vital signs and the nursing notes.  Pertinent labs & imaging results that were available during my care of the patient were reviewed by me and considered in my medical decision making (see chart for details).  Acute nonrecurrent sinusitis  Patient is in no signs of distress nor toxic appearing.  Vital signs are stable.  Low suspicion for pneumonia, pneumothorax or bronchitis and therefore will defer imaging.  Symptoms present for 2 weeks will empirically placed on antibiotics, prescribed doxycycline  and prednisone .May use additional over-the-counter medications as needed for supportive care.  May follow-up with urgent care as needed if symptoms persist or worsen.   Final Clinical Impressions(s) / UC Diagnoses   Final diagnoses:  Acute non-recurrent sinusitis, unspecified location     Discharge Instructions      Today you are being treated for sinus infection  Take doxycycline  twice daily for 10 days  Begin prednisone  every morning with food as directed to reduce sinus pressure  Continue use of Claritin and nasal spray for further cough   For cough: honey 1/2 to 1 teaspoon (you can dilute the honey in water or another fluid).  You can also use guaifenesin  and dextromethorphan  for cough. You can use a humidifier for chest congestion and cough.  If you don't have a humidifier, you can sit in the bathroom with the hot shower running.      For sore throat: try warm salt water gargles, cepacol lozenges, throat spray, warm tea or water with lemon/honey, popsicles or ice, or OTC  cold relief medicine for throat discomfort.   For congestion: take a daily anti-histamine like Zyrtec , Claritin, and a oral decongestant, such as pseudoephedrine.  You can also use Flonase 1-2 sprays in each nostril daily.   It is important to stay hydrated: drink plenty of fluids (water, gatorade/powerade/pedialyte, juices, or teas) to keep your throat moisturized and help further relieve irritation/discomfort.    ED Prescriptions     Medication Sig Dispense Auth. Provider   doxycycline  (VIBRAMYCIN ) 100 MG capsule Take 1 capsule (100 mg total) by mouth 2 (two) times daily. 20 capsule Sritha Chauncey R, NP   predniSONE  (STERAPRED UNI-PAK 21 TAB) 10 MG (21) TBPK tablet Take by mouth daily. Take 6 tabs by mouth daily  for 1 days, then 5 tabs for 1 days, then 4 tabs for 1 days, then 3 tabs for 1 days, 2 tabs for 1 days, then 1 tab by mouth daily for 1 days 21 tablet William Laske, Shelba SAUNDERS, NP      PDMP not reviewed this encounter.   Teresa Shelba SAUNDERS, NP 12/20/23 1056

## 2023-12-20 NOTE — Telephone Encounter (Signed)
 FYI Only or Action Required?: FYI only for provider.  Patient was last seen in primary care on 04/10/2023 by Emilio Kelly DASEN, FNP. Called Nurse Triage reporting Sinus Problem. Symptoms began today. Interventions attempted: Other: Patient currently at Urgent Care. Symptoms are: stable.  Triage Disposition: Call PCP When Office is Open- Patient at urgent at time of NT phone call. Advised patient to follow up if she has any further needs  Patient/caregiver understands and will follow disposition?: Yes    Copied from CRM [id number]. Topic: Appointments - Appointment Scheduling >> Dec 20, 2023  7:53 AM Dawna HERO wrote: Patient/patient representative is calling to schedule an appointment. Refer to attachments for appointment information. Unable to schedule due to decision tree stating to send a message to nurse triage. Patient stated its an issue she has seen her provider for and its now getting worse. Says she can receive a call back on her number on file , says the appointment was about her sinuses and she felt clogged up . Reason for Disposition  Requesting regular office appointment  Answer Assessment - Initial Assessment Questions 1. REASON FOR CALL or QUESTION: What is your reason for calling today? or How can I best help you? or What question do you have that I can help answer?     Patient called earlier to scheduled for sinus problems and was routed to NT for eval  Protocols used: Information Only Call - No Triage-A-AH

## 2023-12-20 NOTE — ED Triage Notes (Signed)
 Patient complains of sinus head ache and bilateral ear pain. She reports that she went to see PCP and was told she had swollen lymph nodes. Was referred to ENT and ENT told her that her that lymph nodes were not swollen. Went back to PCP on June 11 and was not given any medications. Told her to keep using nasal spray and claritin. Rate pain 3/10

## 2023-12-22 ENCOUNTER — Telehealth: Admitting: Nurse Practitioner

## 2023-12-22 ENCOUNTER — Other Ambulatory Visit: Payer: Self-pay | Admitting: Acute Care

## 2023-12-22 DIAGNOSIS — F17210 Nicotine dependence, cigarettes, uncomplicated: Secondary | ICD-10-CM

## 2023-12-22 DIAGNOSIS — J0110 Acute frontal sinusitis, unspecified: Secondary | ICD-10-CM | POA: Diagnosis not present

## 2023-12-22 DIAGNOSIS — Z122 Encounter for screening for malignant neoplasm of respiratory organs: Secondary | ICD-10-CM

## 2023-12-22 DIAGNOSIS — Z87891 Personal history of nicotine dependence: Secondary | ICD-10-CM

## 2023-12-23 NOTE — Progress Notes (Signed)
 I have spent 5 minutes in review of e-visit questionnaire, review and updating patient chart, medical decision making and response to patient.   Claiborne Rigg, NP

## 2023-12-23 NOTE — Progress Notes (Signed)
 E-Visit for Cough   We are sorry that you are not feeling well.  Here is how we plan to help!  Based on your presentation I recommend continuing the docycycline, albuterol  inhaler and prednisone  until completion.     In addition you may use A non-prescription cough medication called Mucinex  DM: take 2 tablets every 12 hours.    From your responses in the eVisit questionnaire you describe inflammation in the upper respiratory tract which is causing a significant cough.  This is commonly called Bronchitis and has four common causes:   Allergies Viral Infections Acid Reflux Bacterial Infection Allergies, viruses and acid reflux are treated by controlling symptoms or eliminating the cause. An example might be a cough caused by taking certain blood pressure medications. You stop the cough by changing the medication. Another example might be a cough caused by acid reflux. Controlling the reflux helps control the cough.  USE OF BRONCHODILATOR (RESCUE) INHALERS: There is a risk from using your bronchodilator too frequently.  The risk is that over-reliance on a medication which only relaxes the muscles surrounding the breathing tubes can reduce the effectiveness of medications prescribed to reduce swelling and congestion of the tubes themselves.  Although you feel brief relief from the bronchodilator inhaler, your asthma may actually be worsening with the tubes becoming more swollen and filled with mucus.  This can delay other crucial treatments, such as oral steroid medications. If you need to use a bronchodilator inhaler daily, several times per day, you should discuss this with your provider.  There are probably better treatments that could be used to keep your asthma under control.     HOME CARE Only take medications as instructed by your medical team. Complete the entire course of an antibiotic. Drink plenty of fluids and get plenty of rest. Avoid close contacts especially the very young and  the elderly Cover your mouth if you cough or cough into your sleeve. Always remember to wash your hands A steam or ultrasonic humidifier can help congestion.   GET HELP RIGHT AWAY IF: You develop worsening fever. You become short of breath You cough up blood. Your symptoms persist after you have completed your treatment plan MAKE SURE YOU  Understand these instructions. Will watch your condition. Will get help right away if you are not doing well or get worse.    Thank you for choosing an e-visit.  Your e-visit answers were reviewed by a board certified advanced clinical practitioner to complete your personal care plan. Depending upon the condition, your plan could have included both over the counter or prescription medications.  Please review your pharmacy choice. Make sure the pharmacy is open so you can pick up prescription now. If there is a problem, you may contact your provider through [employer] and have the prescription routed to another pharmacy.  Your safety is important to us . If you have drug allergies check your prescription carefully.   For the next 24 hours you can use MyChart to ask questions about today's visit, request a non-urgent call back, or ask for a work or school excuse. You will get an email in the next two days asking about your experience. I hope that your e-visit has been valuable and will speed your recovery.

## 2024-01-04 ENCOUNTER — Ambulatory Visit (INDEPENDENT_AMBULATORY_CARE_PROVIDER_SITE_OTHER): Admitting: Physician Assistant

## 2024-01-04 VITALS — BP 137/58 | HR 74 | Resp 16 | Wt 93.0 lb

## 2024-01-04 DIAGNOSIS — Z720 Tobacco use: Secondary | ICD-10-CM

## 2024-01-04 DIAGNOSIS — J441 Chronic obstructive pulmonary disease with (acute) exacerbation: Secondary | ICD-10-CM | POA: Diagnosis not present

## 2024-01-04 DIAGNOSIS — J329 Chronic sinusitis, unspecified: Secondary | ICD-10-CM | POA: Diagnosis not present

## 2024-01-04 DIAGNOSIS — E7849 Other hyperlipidemia: Secondary | ICD-10-CM | POA: Diagnosis not present

## 2024-01-04 DIAGNOSIS — Z1231 Encounter for screening mammogram for malignant neoplasm of breast: Secondary | ICD-10-CM | POA: Diagnosis not present

## 2024-01-04 DIAGNOSIS — R030 Elevated blood-pressure reading, without diagnosis of hypertension: Secondary | ICD-10-CM | POA: Diagnosis not present

## 2024-01-04 DIAGNOSIS — R636 Underweight: Secondary | ICD-10-CM | POA: Diagnosis not present

## 2024-01-04 MED ORDER — LEVOCETIRIZINE DIHYDROCHLORIDE 5 MG PO TABS: 5.0000 mg | ORAL_TABLET | Freq: Every evening | ORAL | 2 refills | Status: DC

## 2024-01-04 MED ORDER — MIRTAZAPINE 7.5 MG PO TABS: 7.5000 mg | ORAL_TABLET | Freq: Every day | ORAL | 1 refills | Status: DC

## 2024-01-04 NOTE — Progress Notes (Signed)
 Established patient visit  Patient: Sierra Brown   DOB: [birth year]   73 y.o. Female  MRN: [id number] Visit Date: 01/04/2024  Today's healthcare provider: Jolynn Spencer, PA-C   Chief Complaint  Patient presents with   Follow-up    6 wk f/u No other concerns   Subjective     HPI     Follow-up    Additional comments: 6 wk f/u No other concerns      Last edited by Marylen Odella LITTIE, CMA on 01/04/2024  8:13 AM.       Discussed the use of AI scribe software for clinical note transcription with the patient, who gave verbal consent to proceed.  History of Present Illness Sierra Brown is a 73 year old female with COPD who presents with persistent sinus problems.  She experiences ongoing sinus issues with post-nasal drainage and occasional pressure despite using nasal saline spray, Flonase, and Claritin. There is no sore throat, headache, or facial pain. She manages COPD with Trelegy every morning and albuterol  as needed, with occasional coughing and wheezing, especially in hot, humid conditions. She is reducing smoking, currently at six cigarettes a day. She is experiencing weight loss and maintains a diet of three meals a day with protein shakes. She is on mirtazapine  and requires a refill. She is dealing with stress following her husband's recent death and estate management. She monitors her blood pressure at home, with a history of slightly elevated readings, and is not on medication. No urinary problems or significant health changes since her last visit.       01/04/2024    8:38 AM 10/09/2023   10:09 AM 04/10/2023    9:38 AM  Depression screen PHQ 2/9  Decreased Interest 0 0 0  Down, Depressed, Hopeless 0 1 1  PHQ - 2 Score 0 1 1  Altered sleeping 0 0   Tired, decreased energy 0 3   Change in appetite 0 3   Feeling bad or failure about yourself  0 0   Trouble concentrating 0 0   Moving slowly or fidgety/restless 0 0   Suicidal thoughts 0 0   PHQ-9 Score 0 7    Difficult doing work/chores Not difficult at all Not difficult at all       01/04/2024    8:38 AM 10/09/2023   10:09 AM 04/10/2023    9:38 AM  GAD 7 : Generalized Anxiety Score  Nervous, Anxious, on Edge 0 1 1  Control/stop worrying 0 1 1  Worry too much - different things 0 1 1  Trouble relaxing 0 0 1  Restless 0 0 0  Easily annoyed or irritable 0 0 0  Afraid - awful might happen 0 0 0  Total GAD 7 Score 0 3 4  Anxiety Difficulty Not difficult at all  Not difficult at all    Medications: Outpatient Medications Prior to Visit  Medication Sig   albuterol  (VENTOLIN  HFA) 108 (90 Base) MCG/ACT inhaler INHALE 2 PUFFS INTO THE LUNGS EVERY 6 (SIX) HOURS AS NEEDED FOR WHEEZING OR SHORTNESS OF BREATH. GIVEN ONE REFILL - FURTHER NEEDS NEEDS TO MAKE APPT.   aspirin  EC 81 MG tablet Take 1 tablet (81 mg total) by mouth daily. Swallow whole.   azelastine  (ASTELIN ) 0.1 % nasal spray Place 1 spray into both nostrils 2 (two) times daily. Use in each nostril as directed   Fluticasone-Umeclidin-Vilant (TRELEGY ELLIPTA ) 100-62.5-25 MCG/ACT AEPB Inhale 1 puff into the lungs daily.   ipratropium-albuterol  (DUONEB) 0.5-2.5 (  3) MG/3ML SOLN Take 3 mLs by nebulization every 6 (six) hours as needed.   Misc. Devices (FINGERTIP PULSE OXIMETER) MISC 1 Device by Does not apply route daily.   sodium chloride  (OCEAN) 0.65 % SOLN nasal spray Place 1 spray into both nostrils as needed for congestion (nose irritation).   [DISCONTINUED] mirtazapine  (REMERON ) 7.5 MG tablet Take 1 tablet (7.5 mg total) by mouth at bedtime.   doxycycline  (VIBRAMYCIN ) 100 MG capsule Take 1 capsule (100 mg total) by mouth 2 (two) times daily. (Patient not taking: Reported on 01/04/2024)   predniSONE  (STERAPRED UNI-PAK 21 TAB) 10 MG (21) TBPK tablet Take by mouth daily. Take 6 tabs by mouth daily  for 1 days, then 5 tabs for 1 days, then 4 tabs for 1 days, then 3 tabs for 1 days, 2 tabs for 1 days, then 1 tab by mouth daily for 1 days (Patient  not taking: Reported on 01/04/2024)   rosuvastatin  (CRESTOR ) 40 MG tablet Take 1 tablet (40 mg total) by mouth daily. (Patient not taking: Reported on 01/04/2024)   Facility-Administered Medications Prior to Visit  Medication Dose Route Frequency Provider   albuterol  (PROVENTIL ) (2.5 MG/3ML) 0.083% nebulizer solution 2.5 mg  2.5 mg Nebulization Once Wert, Michael B, MD    Review of Systems All negative Except see HPI       Objective    BP (!) 137/58 (BP Location: Right Arm, Patient Position: Sitting, Cuff Size: Small)   Pulse 74   Resp 16   Wt 93 lb (42.2 kg)   SpO2 98%   BMI 15.48 kg/m     Physical Exam Vitals reviewed.  Constitutional:      General: She is not in acute distress.    Appearance: Normal appearance. She is well-developed. She is not diaphoretic.  HENT:     Head: Normocephalic and atraumatic.  Eyes:     General: No scleral icterus.    Conjunctiva/sclera: Conjunctivae normal.  Neck:     Thyroid : No thyromegaly.  Cardiovascular:     Rate and Rhythm: Normal rate and regular rhythm.     Pulses: Normal pulses.     Heart sounds: Normal heart sounds. No murmur heard. Pulmonary:     Effort: Pulmonary effort is normal. No respiratory distress.     Breath sounds: Normal breath sounds. No wheezing, rhonchi or rales.  Musculoskeletal:     Cervical back: Neck supple.     Right lower leg: No edema.     Left lower leg: No edema.  Lymphadenopathy:     Cervical: No cervical adenopathy.  Skin:    General: Skin is warm and dry.     Findings: No rash.  Neurological:     Mental Status: She is alert and oriented to person, place, and time. Mental status is at baseline.  Psychiatric:        Mood and Affect: Mood normal.        Behavior: Behavior normal.      No results found for any visits on 01/04/24.      Assessment & Plan Chronic Sinusitis Sinus issues likely related to allergies with post-nasal drainage and occasional pressure. - Prescribe Xyzal  for  allergy management. - Advise use of nasal saline spray and Flonase. - Encourage hydration with mild fluids and hot tea with honey. - Advise wearing a mask to prevent exacerbation of symptoms. - Instruct to report if sinusitis worsens.  COPD Symptoms exacerbated by hot and humid weather. Uses Trelegy daily and albuterol  as  needed. - Continue Trelegy daily. - Use albuterol  as needed for wheezing. - Advise monitoring symptoms and report if she worsens.  Underweight/weight Loss Chronic , slightly worsening  low BMI with reports of eating three meals a day and protein shakes in between. Concerned about weight loss. - Prescribe mirtazapine  for weight maintenance. - Encourage regular meals and protein shakes. Will review labs from 12/04/2023 were within normal limit Will follow-up  Depression On mirtazapine  for depression. Experiencing significant stress due to recent loss of her husband and other personal matters/selling house, moving. - Prescribe mirtazapine . - Monitor depression symptoms.  Elevated blood pressure reading Blood pressure slightly elevated. Not on antihypertensive medication. - Instruct to measure blood pressure at home morning and evening. - Advise reducing salt intake. - Schedule follow-up in one month to reassess blood pressure.  Hyperlipidemia Chronic , improving stopped Crestor  due to leg cramps. Plans to reassess cholesterol levels. - Order cholesterol level test. - Advise taking fish oil once a day. Advise low cholesterol diet and regular exercise as tolerated  General Health Maintenance Declined colonoscopy but agreed to lung cancer screening and mammogram. - Order mammogram. - Confirm order for lung cancer screening. - Advise scheduling appointments for screenings.   Tobacco abuse Chronic, improving 6 cigarettes/day Will reassess  Underweight  - mirtazapine  (REMERON ) 7.5 MG tablet; Take 1 tablet (7.5 mg total) by mouth at bedtime.  Dispense: 90  tablet; Refill: 1 - Lipid panel  Breast cancer screening by mammogram  - MM 3D Screening Breast Bilateral; Future  Other sinusitis, unspecified chronicity  - levocetirizine (XYZAL ) 5 MG tablet; Take 1 tablet (5 mg total) by mouth every evening.  Dispense: 30 tablet; Refill: 2  Will need to check CBC and CMP at the follow-up  Orders Placed This Encounter  Procedures   MM 3D Screening Breast Bilateral    Standing Status:   Future    Expiration Date:   04/05/2024    Reason for Exam (SYMPTOM  OR DIAGNOSIS REQUIRED):   Breast cancer screening    Preferred imaging location?:   [facility]   Lipid panel    Has the patient fasted?:   Yes    Return in about 4 weeks (around 02/01/2024) for BP f/u, chronic disease f/u.   The patient was advised to call back or seek an in-person evaluation if the symptoms worsen or if the condition fails to improve as anticipated.  I discussed the assessment and treatment plan with the patient. The patient was provided an opportunity to ask questions and all were answered. The patient agreed with the plan and demonstrated an understanding of the instructions.  I, Zeidy Tayag, PA-C have reviewed all documentation for this visit. The documentation on 01/04/2024  for the exam, diagnosis, procedures, and orders are all accurate and complete.  Jolynn Spencer, [facility], MMS [facility] [phone] (phone) [phone] (fax)  [facility]

## 2024-01-05 LAB — LIPID PANEL
Chol/HDL Ratio: 3.2 ratio (ref 0.0–4.4)
Cholesterol, Total: 160 mg/dL (ref 100–199)
HDL: 50 mg/dL (ref >39)
LDL Chol Calc (NIH): 96 mg/dL (ref 0–99)
Triglycerides: 72 mg/dL (ref 0–149)
VLDL Cholesterol Cal: 14 mg/dL (ref 5–40)

## 2024-01-07 ENCOUNTER — Ambulatory Visit: Payer: Self-pay | Admitting: Physician Assistant

## 2024-01-07 DIAGNOSIS — E7849 Other hyperlipidemia: Secondary | ICD-10-CM

## 2024-01-12 NOTE — Telephone Encounter (Signed)
 Please see the pt response below

## 2024-01-16 MED ORDER — EZETIMIBE 10 MG PO TABS: 5.0000 mg | ORAL_TABLET | Freq: Every day | ORAL | 1 refills | Status: DC

## 2024-02-21 ENCOUNTER — Ambulatory Visit: Payer: Self-pay

## 2024-02-21 ENCOUNTER — Encounter: Admitting: Physician Assistant

## 2024-02-21 DIAGNOSIS — Z0000 Encounter for general adult medical examination without abnormal findings: Secondary | ICD-10-CM | POA: Diagnosis not present

## 2024-02-21 DIAGNOSIS — Z780 Asymptomatic menopausal state: Secondary | ICD-10-CM

## 2024-02-21 NOTE — Patient Instructions (Signed)
 Sierra Brown , Thank you for taking time out of your busy schedule to complete your Annual Wellness Visit with me. I enjoyed our conversation and look forward to speaking with you again next year. I, as well as your care team,  appreciate your ongoing commitment to your health goals. Please review the following plan we discussed and let me know if I can assist you in the future.  REFERRAL SENT FOR BONE DENSITY SCAN  You have an order for:  []   2D Mammogram  []   3D Mammogram  [x]   Bone Density     Please call for appointment:  [facility]  [address] [place] [address] [phone] [facility] [address] [place], [place] [address] [phone] [facility] [address] [place], [place] [address] [phone]   Make sure to wear two-piece clothing.  No lotions, powders, or deodorants the day of the appointment. Make sure to bring picture ID and insurance card.  Bring list of medications you are currently taking including any supplements.   Schedule your Magazine screening mammogram through MyChart!   Log into your MyChart account.  Go to 'Visit' (or 'Appointments' if on mobile App) --> Schedule an Appointment  Under 'Select a Reason for Visit' choose the Mammogram Screening option.  Complete the pre-visit questions and select the time and place that best fits your schedule.   Follow up Visits: 02/26/25 @ 12:30 PM BY PHONE We will see or speak with you next year for your Next Medicare AWV with our clinical staff Have you seen your provider in the last 6 months (3 months if uncontrolled diabetes)? Yes  Clinician Recommendations:  Aim for 30 minutes of exercise or brisk walking, 6-8 glasses of water, and 5 servings of fruits and vegetables each day. TAKE CARE!      This is a list of the screenings recommended for you:  Health Maintenance   Topic Date Due   Colon Cancer Screening  Never done   COVID-19 Vaccine (1 - 2024-25 season) Never done   DEXA scan (bone density measurement)  10/15/2023   Screening for Lung Cancer  11/08/2023   Mammogram  11/16/2023   Flu Shot  01/26/2024   Zoster (Shingles) Vaccine (1 of 2) 04/05/2024*   DTaP/Tdap/Td vaccine (2 - Tdap) 01/03/2025*   Medicare Annual Wellness Visit  02/20/2025   Pneumococcal Vaccine for age over 70  Completed   HPV Vaccine  Aged Out   Meningitis B Vaccine  Aged Out   Hepatitis C Screening  Discontinued  *Topic was postponed. The date shown is not the original due date.    Advanced directives: (ACP Link)Information on Advanced Care Planning can be found at [place]  Secretary of [facility] Advance Health Care Directives Advance Health Care Directives. [email or web address]  Advance Care Planning is important because it:  [x]  Makes sure you receive the medical care that is consistent with your values, goals, and preferences  [x]  It provides guidance to your family and loved ones and reduces their decisional burden about whether or not they are making the right decisions based on your wishes.  Follow the link provided in your after visit summary or read over the paperwork we have mailed to you to help you started getting your Advance Directives in place. If you need assistance in completing these, please reach out to us  so that we can help  you!

## 2024-02-21 NOTE — Progress Notes (Signed)
 Subjective:   Sierra Brown is a 73 y.o. who presents for a Medicare Wellness preventive visit.  As a reminder, Annual Wellness Visits don't include a physical exam, and some assessments may be limited, especially if this visit is performed virtually. We may recommend an in-person follow-up visit with your provider if needed.  Visit Complete: Virtual I connected with  Adrinne L Haliburton on 02/21/24 by a audio enabled telemedicine application and verified that I am speaking with the correct person using two identifiers.  Patient Location: Home  Provider Location: Home Office  I discussed the limitations of evaluation and management by telemedicine. The patient expressed understanding and agreed to proceed.  Vital Signs: Because this visit was a virtual/telehealth visit, some criteria may be missing or patient reported. Any vitals not documented were not able to be obtained and vitals that have been documented are patient reported.  VideoDeclined- This patient declined Librarian, academic. Therefore the visit was completed with audio only.  Persons Participating in Visit: Patient.  AWV Questionnaire: No: Patient Medicare AWV questionnaire was not completed prior to this visit.  Cardiac Risk Factors include: advanced age (>5men, >66 women);hypertension;smoking/ tobacco exposure     Objective:    There were no vitals filed for this visit. There is no height or weight on file to calculate BMI.     02/21/2024    2:40 PM 02/20/2023    3:01 PM 09/18/2022   10:58 AM 08/02/2022    8:00 AM 07/23/2022    3:50 PM 07/22/2022    1:38 AM 02/17/2022    8:16 AM  Advanced Directives  Does Patient Have a Medical Advance Directive? No No No No No No No  Would patient like information on creating a medical advance directive? No - Patient declined   No - Patient declined   No - Patient declined    Current Medications (verified) Outpatient Encounter Medications as of  02/21/2024  Medication Sig   albuterol  (VENTOLIN  HFA) 108 (90 Base) MCG/ACT inhaler INHALE 2 PUFFS INTO THE LUNGS EVERY 6 (SIX) HOURS AS NEEDED FOR WHEEZING OR SHORTNESS OF BREATH. GIVEN ONE REFILL - FURTHER NEEDS NEEDS TO MAKE APPT.   aspirin  EC 81 MG tablet Take 1 tablet (81 mg total) by mouth daily. Swallow whole.   azelastine  (ASTELIN ) 0.1 % nasal spray Place 1 spray into both nostrils 2 (two) times daily. Use in each nostril as directed   ezetimibe  (ZETIA ) 10 MG tablet Take 0.5 tablets (5 mg total) by mouth daily.   Fluticasone-Umeclidin-Vilant (TRELEGY ELLIPTA ) 100-62.5-25 MCG/ACT AEPB Inhale 1 puff into the lungs daily.   ipratropium-albuterol  (DUONEB) 0.5-2.5 (3) MG/3ML SOLN Take 3 mLs by nebulization every 6 (six) hours as needed.   levocetirizine (XYZAL ) 5 MG tablet Take 1 tablet (5 mg total) by mouth every evening.   mirtazapine  (REMERON ) 7.5 MG tablet Take 1 tablet (7.5 mg total) by mouth at bedtime.   Misc. Devices (FINGERTIP PULSE OXIMETER) MISC 1 Device by Does not apply route daily.   sodium chloride  (OCEAN) 0.65 % SOLN nasal spray Place 1 spray into both nostrils as needed for congestion (nose irritation).   Facility-Administered Encounter Medications as of 02/21/2024  Medication   albuterol  (PROVENTIL ) (2.5 MG/3ML) 0.083% nebulizer solution 2.5 mg    Allergies (verified) Atorvastatin , Codeine, Levofloxacin, Sulfonamide derivatives, Penicillins, and Sulfa antibiotics   History: Past Medical History:  Diagnosis Date   Allergy    allerlgic rhinitis   Cataract    COPD (chronic obstructive  pulmonary disease) (HCC)    COVID    Pneumonia    Tobacco use disorder    Past Surgical History:  Procedure Laterality Date   CESAREAN SECTION     TONSILLECTOMY     Family History  Problem Relation Age of Onset   Ulcerative colitis Mother        colostomy   Hypertension Mother    Heart Problems Mother    Breast cancer Neg Hx    Social History   Socioeconomic History    Marital status: Married    Spouse name: Not on file   Number of children: Not on file   Years of education: Not on file   Highest education level: 12th grade  Occupational History   Not on file  Tobacco Use   Smoking status: Every Day    Current packs/day: 0.75    Average packs/day: 0.8 packs/day for 49.0 years (36.8 ttl pk-yrs)    Types: Cigarettes   Smokeless tobacco: Never   Tobacco comments:    1-2 cigarettes daily- 10/14/2022  Vaping Use   Vaping status: Never Used  Substance and Sexual Activity   Alcohol use: Never   Drug use: Never   Sexual activity: Not on file  Other Topics Concern   Not on file  Social History Narrative   Not on file   Social Drivers of Health   Financial Resource Strain: Low Risk  (02/21/2024)   Overall Financial Resource Strain (CARDIA)    Difficulty of Paying Living Expenses: Not very hard  Recent Concern: Financial Resource Strain - Medium Risk (02/19/2024)   Overall Financial Resource Strain (CARDIA)    Difficulty of Paying Living Expenses: Somewhat hard  Food Insecurity: No Food Insecurity (02/21/2024)   Hunger Vital Sign    Worried About Running Out of Food in the Last Year: Never true    Ran Out of Food in the Last Year: Never true  Recent Concern: Food Insecurity - Food Insecurity Present (02/19/2024)   Hunger Vital Sign    Worried About Running Out of Food in the Last Year: Sometimes true    Ran Out of Food in the Last Year: Sometimes true  Transportation Needs: No Transportation Needs (02/21/2024)   PRAPARE - Administrator, Civil Service (Medical): No    Lack of Transportation (Non-Medical): No  Physical Activity: Sufficiently Active (02/21/2024)   Exercise Vital Sign    Days of Exercise per Week: 7 days    Minutes of Exercise per Session: 30 min  Recent Concern: Physical Activity - Insufficiently Active (02/19/2024)   Exercise Vital Sign    Days of Exercise per Week: 3 days    Minutes of Exercise per Session: 10 min   Stress: Stress Concern Present (02/21/2024)   [employer] of Occupational Health - Occupational Stress Questionnaire    Feeling of Stress: To some extent  Social Connections: Moderately Integrated (02/21/2024)   Social Connection and Isolation Panel    Frequency of Communication with Friends and Family: More than three times a week    Frequency of Social Gatherings with Friends and Family: More than three times a week    Attends Religious Services: More than 4 times per year    Active Member of [employer] or Organizations: Yes    Attends Banker Meetings: More than 4 times per year    Marital Status: Widowed    Tobacco Counseling Ready to quit: Not Answered Counseling given: Not Answered Tobacco comments: 1-2 cigarettes daily-  10/14/2022    Clinical Intake:  Pre-visit preparation completed: Yes  Pain : No/denies pain     BMI - recorded: 15.5 Nutritional Status: BMI <19  Underweight Nutritional Risks: None Diabetes: No  Lab Results  Component Value Date   HGBA1C 5.5 10/09/2023   HGBA1C 5.3 04/10/2023     How often do you need to have someone help you when you read instructions, pamphlets, or other written materials from your doctor or pharmacy?: 1 - Never  Interpreter Needed?: No  Information entered by :: JHONNIE DAS, LPN   Activities of Daily Living    02/21/2024    2:42 PM 02/19/2024    6:43 PM  In your present state of health, do you have any difficulty performing the following activities:  Hearing? 1 1  Vision? 1 1  Difficulty concentrating or making decisions? 0 0  Walking or climbing stairs? 0 0  Dressing or bathing? 0 0  Doing errands, shopping? 0 0  Preparing Food and eating ? N N  Using the Toilet? N N  In the past six months, have you accidently leaked urine? N N  Do you have problems with loss of bowel control? N N  Managing your Medications? N N  Managing your Finances? N N  Housekeeping or managing your Housekeeping? N N     Patient Care Team: Ostwalt, Janna, PA-C as PCP - General (Physician Assistant) Mona Vinie BROCKS, MD as PCP - Cardiology (Cardiology) Center, [facility]  I have updated your Care Teams any recent Medical Services you may have received from other providers in the past year.     Assessment:   This is a routine wellness examination for Tabetha.  Hearing/Vision screen Hearing Screening - Comments:: WEARS AIDS, BOTH EARS Vision Screening - Comments:: WEARS GLASSES ALL DAY- [place]- HAS APPT 9/5   Goals Addressed             This Visit's Progress    DIET - INCREASE WATER INTAKE         Depression Screen     02/21/2024    2:38 PM 01/04/2024    8:38 AM 10/09/2023   10:09 AM 04/10/2023    9:38 AM 02/20/2023    2:59 PM 10/17/2022    1:18 PM 08/08/2022   10:22 AM  PHQ 2/9 Scores  PHQ - 2 Score 1 0 1 1 0 0 0  PHQ- 9 Score 1 0 7   0 2    Fall Risk     02/19/2024    6:43 PM 04/10/2023    9:37 AM 02/20/2023    2:55 PM 10/17/2022    1:18 PM 08/08/2022   10:17 AM  Fall Risk   Falls in the past year? 0 0 0 0 0  Number falls in past yr: 1  0 0   Injury with Fall? 0 0 0 0   Risk for fall due to : No Fall Risks No Fall Risks No Fall Risks No Fall Risks   Follow up Falls evaluation completed;Falls prevention discussed Falls evaluation completed Education provided;Falls prevention discussed  Falls evaluation completed    MEDICARE RISK AT HOME:  Medicare Risk at Home Any stairs in or around the home?: Yes If so, are there any without handrails?: No Home free of loose throw rugs in walkways, pet beds, electrical cords, etc?: No Adequate lighting in your home to reduce risk of falls?: Yes Life alert?: No Use of a cane, walker or w/c?: No Grab bars  in the bathroom?: No Shower chair or bench in shower?: No Elevated toilet seat or a handicapped toilet?: No  TIMED UP AND GO:  Was the test performed?  No  Cognitive Function: 6CIT completed        02/21/2024    2:43 PM  02/20/2023    3:05 PM 02/17/2022    8:18 AM  6CIT Screen  What Year? 0 points 0 points 0 points  What month? 0 points 0 points 0 points  What time? 0 points 0 points 0 points  Count back from 20 0 points 0 points 0 points  Months in reverse 0 points 0 points 0 points  Repeat phrase 0 points 0 points 0 points  Total Score 0 points 0 points 0 points    Immunizations Immunization History  Administered Date(s) Administered   Fluad Quad(high Dose 65+) 04/27/2022   Influenza Whole 03/28/2007, 04/08/2010   Influenza-Unspecified 03/27/2013, 04/27/2021, 05/01/2023   PNEUMOCOCCAL CONJUGATE-20 08/25/2021   Pneumococcal Polysaccharide-23 08/01/2007   Td 06/27/1997    Screening Tests Health Maintenance  Topic Date Due   Colonoscopy  Never done   COVID-19 Vaccine (1 - 2024-25 season) Never done   DEXA SCAN  10/15/2023   Lung Cancer Screening  11/08/2023   MAMMOGRAM  11/16/2023   INFLUENZA VACCINE  01/26/2024   Zoster Vaccines- Shingrix (1 of 2) 04/05/2024 (Originally 05/09/2003)   DTaP/Tdap/Td (2 - Tdap) 01/03/2025 (Originally 06/28/2007)   Medicare Annual Wellness (AWV)  02/20/2025   Pneumococcal Vaccine: 50+ Years  Completed   HPV VACCINES  Aged Out   Meningococcal B Vaccine  Aged Out   Hepatitis C Screening  Discontinued    Health Maintenance  Health Maintenance Due  Topic Date Due   Colonoscopy  Never done   COVID-19 Vaccine (1 - 2024-25 season) Never done   DEXA SCAN  10/15/2023   Lung Cancer Screening  11/08/2023   MAMMOGRAM  11/16/2023   INFLUENZA VACCINE  01/26/2024   Health Maintenance Items Addressed: HAS ORDERS FOR MAMMOGRAM, LUNG CA SCREENING; ORDERED BDS  Additional Screening:  Vision Screening: Recommended annual ophthalmology exams for early detection of glaucoma and other disorders of the eye. Would you like a referral to an eye doctor? No    Dental Screening: Recommended annual dental exams for proper oral hygiene  Community Resource Referral / Chronic  Care Management: CRR required this visit?  No   CCM required this visit?  No   Plan:    I have personally reviewed and noted the following in the patient's chart:   Medical and social history Use of alcohol, tobacco or illicit drugs  Current medications and supplements including opioid prescriptions. Patient is not currently taking opioid prescriptions. Functional ability and status Nutritional status Physical activity Advanced directives List of other physicians Hospitalizations, surgeries, and ER visits in previous 12 months Vitals Screenings to include cognitive, depression, and falls Referrals and appointments  In addition, I have reviewed and discussed with patient certain preventive protocols, quality metrics, and best practice recommendations. A written personalized care plan for preventive services as well as general preventive health recommendations were provided to patient.   Jhonnie GORMAN Das, LPN   1/72/7974   After Visit Summary: (MyChart) Due to this being a telephonic visit, the after visit summary with patients personalized plan was offered to patient via MyChart   Notes: DEXA ORDERED

## 2024-02-22 ENCOUNTER — Other Ambulatory Visit: Payer: Self-pay | Admitting: Physician Assistant

## 2024-02-22 DIAGNOSIS — J441 Chronic obstructive pulmonary disease with (acute) exacerbation: Secondary | ICD-10-CM

## 2024-03-01 DIAGNOSIS — H2513 Age-related nuclear cataract, bilateral: Secondary | ICD-10-CM | POA: Diagnosis not present

## 2024-03-01 DIAGNOSIS — H52223 Regular astigmatism, bilateral: Secondary | ICD-10-CM | POA: Diagnosis not present

## 2024-03-01 DIAGNOSIS — H524 Presbyopia: Secondary | ICD-10-CM | POA: Diagnosis not present

## 2024-03-22 ENCOUNTER — Other Ambulatory Visit: Payer: Self-pay | Admitting: Physician Assistant

## 2024-03-22 DIAGNOSIS — Z1231 Encounter for screening mammogram for malignant neoplasm of breast: Secondary | ICD-10-CM

## 2024-03-26 ENCOUNTER — Ambulatory Visit
Admission: RE | Admit: 2024-03-26 | Discharge: 2024-03-26 | Disposition: A | Discharge status: Home / Self Care | Source: Ambulatory Visit | Attending: Acute Care | Admitting: Acute Care

## 2024-03-26 DIAGNOSIS — Z87891 Personal history of nicotine dependence: Secondary | ICD-10-CM | POA: Diagnosis not present

## 2024-03-26 DIAGNOSIS — Z122 Encounter for screening for malignant neoplasm of respiratory organs: Secondary | ICD-10-CM | POA: Diagnosis not present

## 2024-03-26 DIAGNOSIS — F17210 Nicotine dependence, cigarettes, uncomplicated: Secondary | ICD-10-CM | POA: Insufficient documentation

## 2024-03-27 DIAGNOSIS — H2513 Age-related nuclear cataract, bilateral: Secondary | ICD-10-CM | POA: Diagnosis not present

## 2024-03-27 DIAGNOSIS — H25041 Posterior subcapsular polar age-related cataract, right eye: Secondary | ICD-10-CM | POA: Diagnosis not present

## 2024-03-27 DIAGNOSIS — H25043 Posterior subcapsular polar age-related cataract, bilateral: Secondary | ICD-10-CM | POA: Diagnosis not present

## 2024-03-27 DIAGNOSIS — H25013 Cortical age-related cataract, bilateral: Secondary | ICD-10-CM | POA: Diagnosis not present

## 2024-03-27 DIAGNOSIS — H2511 Age-related nuclear cataract, right eye: Secondary | ICD-10-CM | POA: Diagnosis not present

## 2024-03-27 DIAGNOSIS — H47323 Drusen of optic disc, bilateral: Secondary | ICD-10-CM | POA: Diagnosis not present

## 2024-03-30 ENCOUNTER — Other Ambulatory Visit: Payer: Self-pay | Admitting: Family Medicine

## 2024-04-01 ENCOUNTER — Other Ambulatory Visit: Payer: Self-pay

## 2024-04-01 DIAGNOSIS — Z122 Encounter for screening for malignant neoplasm of respiratory organs: Secondary | ICD-10-CM

## 2024-04-01 DIAGNOSIS — F17210 Nicotine dependence, cigarettes, uncomplicated: Secondary | ICD-10-CM

## 2024-04-01 DIAGNOSIS — Z87891 Personal history of nicotine dependence: Secondary | ICD-10-CM

## 2024-04-01 NOTE — Telephone Encounter (Signed)
 Requested Prescriptions  Refused Prescriptions Disp Refills   rosuvastatin  (CRESTOR ) 40 MG tablet [Pharmacy Med Name: ROSUVASTATIN  CALCIUM  40 MG TAB] 90 tablet 2    Sig: TAKE 1 TABLET BY MOUTH EVERY DAY     Cardiovascular:  Antilipid - Statins 2 Failed - 04/01/2024  2:44 PM      Failed - Lipid Panel in normal range within the last 12 months    Cholesterol, Total  Date Value Ref Range Status  01/04/2024 160 100 - 199 mg/dL Final   LDL Chol Calc (NIH)  Date Value Ref Range Status  01/04/2024 96 0 - 99 mg/dL Final   HDL  Date Value Ref Range Status  01/04/2024 50 >39 mg/dL Final   Triglycerides  Date Value Ref Range Status  01/04/2024 72 0 - 149 mg/dL Final         Passed - Cr in normal range and within 360 days    Creatinine, Ser  Date Value Ref Range Status  12/04/2023 0.75 0.57 - 1.00 mg/dL Final         Passed - Patient is not pregnant      Passed - Valid encounter within last 12 months    Recent Outpatient Visits           2 months ago Underweight   [place] [facility] [place], [place], PA-C   3 months ago Annual physical exam   [place] [facility] [place] [facility] [place], [place], PA-C   5 months ago Other sinusitis, unspecified chronicity   [place] [facility] [place], [place], PA-C       Future Appointments             In 8 months Ostwalt, Janna, PA-C [facility], [place]

## 2024-04-17 ENCOUNTER — Ambulatory Visit
Admission: RE | Admit: 2024-04-17 | Discharge: 2024-04-17 | Disposition: A | Discharge status: Home / Self Care | Source: Ambulatory Visit | Attending: Physician Assistant | Admitting: Physician Assistant

## 2024-04-17 DIAGNOSIS — Z1231 Encounter for screening mammogram for malignant neoplasm of breast: Secondary | ICD-10-CM | POA: Insufficient documentation

## 2024-04-17 DIAGNOSIS — Z780 Asymptomatic menopausal state: Secondary | ICD-10-CM | POA: Diagnosis not present

## 2024-04-17 DIAGNOSIS — M810 Age-related osteoporosis without current pathological fracture: Secondary | ICD-10-CM | POA: Diagnosis not present

## 2024-04-22 ENCOUNTER — Ambulatory Visit: Payer: Self-pay | Admitting: Physician Assistant

## 2024-04-23 DIAGNOSIS — H2512 Age-related nuclear cataract, left eye: Secondary | ICD-10-CM | POA: Diagnosis not present

## 2024-04-23 DIAGNOSIS — H2511 Age-related nuclear cataract, right eye: Secondary | ICD-10-CM | POA: Diagnosis not present

## 2024-04-23 DIAGNOSIS — J449 Chronic obstructive pulmonary disease, unspecified: Secondary | ICD-10-CM | POA: Diagnosis not present

## 2024-04-23 DIAGNOSIS — H25042 Posterior subcapsular polar age-related cataract, left eye: Secondary | ICD-10-CM | POA: Diagnosis not present

## 2024-04-23 DIAGNOSIS — H268 Other specified cataract: Secondary | ICD-10-CM | POA: Diagnosis not present

## 2024-04-23 DIAGNOSIS — H25012 Cortical age-related cataract, left eye: Secondary | ICD-10-CM | POA: Diagnosis not present

## 2024-04-23 HISTORY — PX: CATARACT EXTRACTION, BILATERAL: SHX1313

## 2024-04-24 ENCOUNTER — Ambulatory Visit: Payer: Self-pay | Admitting: Physician Assistant

## 2024-04-24 DIAGNOSIS — M810 Age-related osteoporosis without current pathological fracture: Secondary | ICD-10-CM

## 2024-04-24 MED ORDER — ALENDRONATE SODIUM 70 MG PO TABS
70.0000 mg | ORAL_TABLET | ORAL | 11 refills | Status: AC
Start: 2024-04-24 — End: ?

## 2024-04-26 ENCOUNTER — Other Ambulatory Visit: Payer: Self-pay | Admitting: Physician Assistant

## 2024-04-26 DIAGNOSIS — R636 Underweight: Secondary | ICD-10-CM

## 2024-04-30 DIAGNOSIS — Z961 Presence of intraocular lens: Secondary | ICD-10-CM | POA: Diagnosis not present

## 2024-04-30 DIAGNOSIS — H2511 Age-related nuclear cataract, right eye: Secondary | ICD-10-CM | POA: Diagnosis not present

## 2024-04-30 DIAGNOSIS — H2512 Age-related nuclear cataract, left eye: Secondary | ICD-10-CM | POA: Diagnosis not present

## 2024-05-15 ENCOUNTER — Other Ambulatory Visit: Payer: Self-pay | Admitting: Physician Assistant

## 2024-05-15 DIAGNOSIS — E7849 Other hyperlipidemia: Secondary | ICD-10-CM

## 2024-05-25 NOTE — Progress Notes (Unsigned)
 Established patient visit  Patient: Sierra Brown   DOB: [birth year]   73 y.o. Female  MRN: [id number] Visit Date: 05/28/2024  Today's healthcare provider: Jolynn Spencer, PA-C   No chief complaint on file.  Subjective       Discussed the use of AI scribe software for clinical note transcription with the patient, who gave verbal consent to proceed.  History of Present Illness        02/21/2024    2:38 PM 01/04/2024    8:38 AM 10/09/2023   10:09 AM  Depression screen PHQ 2/9  Decreased Interest 0 0 0  Down, Depressed, Hopeless 1 0 1  PHQ - 2 Score 1 0 1  Altered sleeping 0 0 0  Tired, decreased energy 0 0 3  Change in appetite 0 0 3  Feeling bad or failure about yourself  0 0 0  Trouble concentrating 0 0 0  Moving slowly or fidgety/restless 0 0 0  Suicidal thoughts 0 0 0  PHQ-9 Score 1  0  7   Difficult doing work/chores Not difficult at all Not difficult at all Not difficult at all     Data saved with a previous flowsheet row definition      01/04/2024    8:38 AM 10/09/2023   10:09 AM 04/10/2023    9:38 AM  GAD 7 : Generalized Anxiety Score  Nervous, Anxious, on Edge 0 1 1  Control/stop worrying 0 1 1  Worry too much - different things 0 1 1  Trouble relaxing 0 0 1  Restless 0 0 0  Easily annoyed or irritable 0 0 0  Afraid - awful might happen 0 0 0  Total GAD 7 Score 0 3 4  Anxiety Difficulty Not difficult at all  Not difficult at all    Medications: Outpatient Medications Prior to Visit  Medication Sig   albuterol  (VENTOLIN  HFA) 108 (90 Base) MCG/ACT inhaler INHALE 2 PUFFS INTO THE LUNGS EVERY 6 HOURS AS NEEDED FOR WHEEZING OR SHORTNESS OF BREATH. APPT. FOR FURTHER REFILLS   alendronate  (FOSAMAX ) 70 MG tablet Take 1 tablet (70 mg total) by mouth every 7 (seven) days. Take with a full glass of water on an empty stomach.   aspirin  EC 81 MG tablet Take 1 tablet (81 mg total) by mouth daily. Swallow whole.   azelastine  (ASTELIN ) 0.1 % nasal spray Place 1  spray into both nostrils 2 (two) times daily. Use in each nostril as directed   ezetimibe  (ZETIA ) 10 MG tablet TAKE 1/2 TABLET BY MOUTH DAILY   Fluticasone-Umeclidin-Vilant (TRELEGY ELLIPTA ) 100-62.5-25 MCG/ACT AEPB Inhale 1 puff into the lungs daily.   ipratropium-albuterol  (DUONEB) 0.5-2.5 (3) MG/3ML SOLN Take 3 mLs by nebulization every 6 (six) hours as needed.   levocetirizine (XYZAL ) 5 MG tablet Take 1 tablet (5 mg total) by mouth every evening.   mirtazapine  (REMERON ) 7.5 MG tablet Take 1 tablet (7.5 mg total) by mouth at bedtime.   Misc. Devices (FINGERTIP PULSE OXIMETER) MISC 1 Device by Does not apply route daily.   sodium chloride  (OCEAN) 0.65 % SOLN nasal spray Place 1 spray into both nostrils as needed for congestion (nose irritation).   Facility-Administered Medications Prior to Visit  Medication Dose Route Frequency Provider   albuterol  (PROVENTIL ) (2.5 MG/3ML) 0.083% nebulizer solution 2.5 mg  2.5 mg Nebulization Once Wert, Michael B, MD    Review of Systems  All other systems reviewed and are negative.  All negative Except see HPI   {Insert  previous labs (optional):23779} {See past labs  Heme  Chem  Endocrine  Serology  Results Review (optional):1}   Objective    There were no vitals taken for this visit. {Insert last BP/Wt (optional):23777}{See vitals history (optional):1}   Physical Exam Vitals reviewed.  Constitutional:      General: She is not in acute distress.    Appearance: Normal appearance. She is well-developed. She is not diaphoretic.  HENT:     Head: Normocephalic and atraumatic.  Eyes:     General: No scleral icterus.    Conjunctiva/sclera: Conjunctivae normal.  Neck:     Thyroid : No thyromegaly.  Cardiovascular:     Rate and Rhythm: Normal rate and regular rhythm.     Pulses: Normal pulses.     Heart sounds: Normal heart sounds. No murmur heard. Pulmonary:     Effort: Pulmonary effort is normal. No respiratory distress.     Breath  sounds: Normal breath sounds. No wheezing, rhonchi or rales.  Musculoskeletal:     Cervical back: Neck supple.     Right lower leg: No edema.     Left lower leg: No edema.  Lymphadenopathy:     Cervical: No cervical adenopathy.  Skin:    General: Skin is warm and dry.     Findings: No rash.  Neurological:     Mental Status: She is alert and oriented to person, place, and time. Mental status is at baseline.  Psychiatric:        Mood and Affect: Mood normal.        Behavior: Behavior normal.      No results found for any visits on 05/28/24.      Assessment and Plan Assessment & Plan     No orders of the defined types were placed in this encounter.   No follow-ups on file.   The patient was advised to call back or seek an in-person evaluation if the symptoms worsen or if the condition fails to improve as anticipated.  I discussed the assessment and treatment plan with the patient. The patient was provided an opportunity to ask questions and all were answered. The patient agreed with the plan and demonstrated an understanding of the instructions.  I, Kanyon Bunn, PA-C have reviewed all documentation for this visit. The documentation on 05/28/2024  for the exam, diagnosis, procedures, and orders are all accurate and complete.  Jolynn Spencer, [facility], MMS [facility] [phone] (phone) [phone] (fax)  [facility]

## 2024-05-28 ENCOUNTER — Encounter: Payer: Self-pay | Admitting: Physician Assistant

## 2024-05-28 ENCOUNTER — Ambulatory Visit: Admitting: Physician Assistant

## 2024-05-28 VITALS — BP 128/58 | HR 79 | Resp 14 | Ht 65.0 in | Wt 100.6 lb

## 2024-05-28 DIAGNOSIS — R636 Underweight: Secondary | ICD-10-CM | POA: Diagnosis not present

## 2024-05-28 DIAGNOSIS — I10 Essential (primary) hypertension: Secondary | ICD-10-CM

## 2024-05-28 DIAGNOSIS — R5383 Other fatigue: Secondary | ICD-10-CM

## 2024-05-28 DIAGNOSIS — R030 Elevated blood-pressure reading, without diagnosis of hypertension: Secondary | ICD-10-CM | POA: Diagnosis not present

## 2024-05-28 DIAGNOSIS — Z720 Tobacco use: Secondary | ICD-10-CM

## 2024-05-28 DIAGNOSIS — M810 Age-related osteoporosis without current pathological fracture: Secondary | ICD-10-CM

## 2024-05-28 DIAGNOSIS — J441 Chronic obstructive pulmonary disease with (acute) exacerbation: Secondary | ICD-10-CM

## 2024-05-28 DIAGNOSIS — E7849 Other hyperlipidemia: Secondary | ICD-10-CM | POA: Diagnosis not present

## 2024-05-28 DIAGNOSIS — J329 Chronic sinusitis, unspecified: Secondary | ICD-10-CM

## 2024-05-28 DIAGNOSIS — B370 Candidal stomatitis: Secondary | ICD-10-CM

## 2024-05-28 MED ORDER — NYSTATIN 100000 UNIT/ML MT SUSP: 5.0000 mL | Freq: Four times a day (QID) | OROMUCOSAL | 0 refills | Status: DC

## 2024-05-29 ENCOUNTER — Ambulatory Visit: Payer: Self-pay | Admitting: Physician Assistant

## 2024-05-29 LAB — CBC WITH DIFFERENTIAL/PLATELET
Basophils Absolute: 0.1 x10E3/uL (ref 0.0–0.2)
Basos: 1 %
EOS (ABSOLUTE): 0.4 x10E3/uL (ref 0.0–0.4)
Eos: 4 %
Hematocrit: 43.4 % (ref 34.0–46.6)
Hemoglobin: 14.3 g/dL (ref 11.1–15.9)
Immature Grans (Abs): 0 x10E3/uL (ref 0.0–0.1)
Immature Granulocytes: 0 %
Lymphocytes Absolute: 1.7 x10E3/uL (ref 0.7–3.1)
Lymphs: 20 %
MCH: 32 pg (ref 26.6–33.0)
MCHC: 32.9 g/dL (ref 31.5–35.7)
MCV: 97 fL (ref 79–97)
Monocytes Absolute: 0.7 x10E3/uL (ref 0.1–0.9)
Monocytes: 8 %
Neutrophils Absolute: 5.7 x10E3/uL (ref 1.4–7.0)
Neutrophils: 66 %
Platelets: 286 x10E3/uL (ref 150–450)
RBC: 4.47 x10E6/uL (ref 3.77–5.28)
RDW: 12.1 % (ref 11.7–15.4)
WBC: 8.6 x10E3/uL (ref 3.4–10.8)

## 2024-05-29 LAB — LIPID PANEL
Chol/HDL Ratio: 2.7 ratio (ref 0.0–4.4)
Cholesterol, Total: 179 mg/dL (ref 100–199)
HDL: 67 mg/dL (ref >39)
LDL Chol Calc (NIH): 90 mg/dL (ref 0–99)
Triglycerides: 128 mg/dL (ref 0–149)
VLDL Cholesterol Cal: 22 mg/dL (ref 5–40)

## 2024-05-29 LAB — BASIC METABOLIC PANEL WITH GFR
BUN/Creatinine Ratio: 21 (ref 12–28)
BUN: 16 mg/dL (ref 8–27)
CO2: 23 mmol/L (ref 20–29)
Calcium: 9.9 mg/dL (ref 8.7–10.3)
Chloride: 104 mmol/L (ref 96–106)
Creatinine, Ser: 0.76 mg/dL (ref 0.57–1.00)
Glucose: 78 mg/dL (ref 70–99)
Potassium: 4.8 mmol/L (ref 3.5–5.2)
Sodium: 141 mmol/L (ref 134–144)
eGFR: 84 mL/min/1.73 (ref >59)

## 2024-07-01 ENCOUNTER — Ambulatory Visit: Admitting: Student in an Organized Health Care Education/Training Program

## 2024-07-01 ENCOUNTER — Encounter: Payer: Self-pay | Admitting: Student in an Organized Health Care Education/Training Program

## 2024-07-01 VITALS — BP 112/60 | HR 81 | Temp 97.7°F | Ht 65.0 in | Wt 101.8 lb

## 2024-07-01 DIAGNOSIS — J439 Emphysema, unspecified: Secondary | ICD-10-CM

## 2024-07-01 DIAGNOSIS — F17210 Nicotine dependence, cigarettes, uncomplicated: Secondary | ICD-10-CM

## 2024-07-01 MED ORDER — TRELEGY ELLIPTA 100-62.5-25 MCG/ACT IN AEPB: 1.0000 | INHALATION_SPRAY | Freq: Every day | RESPIRATORY_TRACT | 12 refills | Status: AC

## 2024-07-01 MED ORDER — NICOTINE POLACRILEX 2 MG MT LOZG: 2.0000 mg | LOZENGE | OROMUCOSAL | 3 refills | Status: AC | PRN

## 2024-07-01 NOTE — Patient Instructions (Addendum)
" °  VISIT SUMMARY: You came in today for a follow-up visit regarding your COPD. We discussed your current symptoms, smoking habits, and overall health. Your COPD appears to be well-controlled with your current treatment, and you are doing well with your physical activities. We also talked about options for smoking cessation.  YOUR PLAN: -CHRONIC OBSTRUCTIVE PULMONARY DISEASE WITH EMPHYSEMA: COPD is a chronic lung condition that makes it hard to breathe. Your COPD is well-controlled with your current treatment, and you are experiencing mild symptoms. Continue using your Trelegy inhaler daily, stay active, and consider pulmonary rehabilitation and exercise programs in the future. We have scheduled a pulmonary function test to monitor your lung health.  -NICOTINE  DEPENDENCE, CIGARETTES: Nicotine  dependence means you are addicted to the nicotine  in cigarettes. You are currently smoking half a pack a day. We have prescribed nicotine  lozenges to help you quit smoking. Keep trying to reduce your smoking and aim to quit completely.  INSTRUCTIONS: Please continue using your Trelegy inhaler daily and stay active. We have scheduled a pulmonary function test for you. Use the prescribed nicotine  lozenges to help you quit smoking. Follow up with us  if you have any concerns or need further assistance.                      Contains text generated by Abridge.                                 Contains text generated by Abridge.   "

## 2024-07-01 NOTE — Progress Notes (Signed)
 "  Assessment & Plan:   Assessment & Plan  #Chronic obstructive pulmonary disease with emphysema  COPD (emphysema) presumed given imaging and symptoms, but patient has not had PFT's yet. Her disease is well-controlled with current treatment (LABA/LAMA/ICS with Trelegy). She reports mild symptoms, no exacerbations, and effective use of Trelegy inhaler. Personally reviewed her CT scan that shows severe emphysema but no other findings otherwise.  CAT score was 10 today. Previously checked Alpha-1 was MM and with normal levels. Will refill trelegy inhaler and attempt to obtain repeat PFT's.  - Continue Trelegy inhaler daily. - Encouraged regular physical activity. - Discussed pulmonary rehabilitation and exercise program as future options. - Fluticasone-Umeclidin-Vilant (TRELEGY ELLIPTA ) 100-62.5-25 MCG/ACT AEPB; Inhale 1 puff into the lungs daily.  Dispense: 60 each; Refill: 12 - Pulmonary Function Test; Future - Continue lung cancer screening  #Nicotine  dependence, cigarettes  Continues smoking half a pack daily. Interested in nicotine  replacement therapy. Counseled on the importance of smoking cessation.  - nicotine  polacrilex (NICOTINE  MINI) 2 MG lozenge; Take 1 lozenge (2 mg total) by mouth every 2 (two) hours as needed for smoking cessation.  Dispense: 72 lozenge; Refill: 3 - Encouraged efforts to reduce smoking and quit.   Return in about 1 year (around 07/01/2025).  Belva November, MD [facility]  I spent 32 minutes caring for this patient today, including preparing to see the patient, obtaining a medical history , reviewing a separately obtained history, performing a medically appropriate examination and/or evaluation, counseling and educating the patient/family/caregiver, ordering medications, tests, or procedures, documenting clinical information in the electronic health record, and independently interpreting results (not separately reported/billed) and  communicating results to the patient/family/caregiver  End of visit medications:  Meds ordered this encounter  Medications   Fluticasone-Umeclidin-Vilant (TRELEGY ELLIPTA ) 100-62.5-25 MCG/ACT AEPB    Sig: Inhale 1 puff into the lungs daily.    Dispense:  60 each    Refill:  12   nicotine  polacrilex (NICOTINE  MINI) 2 MG lozenge    Sig: Take 1 lozenge (2 mg total) by mouth every 2 (two) hours as needed for smoking cessation.    Dispense:  72 lozenge    Refill:  3    Current Medications[1]   Subjective:   PATIENT ID: Sierra Brown GENDER: female DOB: 1950/10/19, MRN: [id number]  Chief Complaint  Patient presents with   Medical Management of Chronic Issues    No breathing problems. Using Trelegy daily. Has not used ventolin  or duoneb, in a long time.     HPI  Discussed the use of AI scribe software for clinical note transcription with the patient, who gave verbal consent to proceed.  History of Present Illness  Sierra Brown is a 74 year old female with COPD who presents for follow-up.  Initial Visit 07/21/2023:  Patient has a past medical history of emphysema currently maintained on ICS/LABA/LAMA therapy with Trelegy.  She was previously followed by Dr. Darlean and has transitioned her care as she does not want to drive to [facility].  She reports symptoms of exertional dyspnea, occasional cough, and wheeze.  Otherwise, she is in her usual state of health and there is been no change to her symptoms since her last visit with us .  She does not have any fevers, chills, night sweats, or chest pain.  She uses her albuterol  occasionally and is compliant with Trelegy.  She is enrolled in our lung cancer screening program and her last CT scan was in May 2024.  She  unfortunately continues to smoke. She was unable to perform PFT's on the first attempt.  Return Visit 07/01/2024:  She was last seen in the clinic in January 2025 and is currently maintained on triple therapy for COPD. She is  also enrolled in a lung cancer screening program, with her most recent CT scan in September 2025 showing severe emphysema but no nodules.  In April 2025, she visited urgent care for a COPD exacerbation, though she cannot recall the specific trigger. She has not used her nebulizer in a long time and notes improvement in her symptoms, stating 'I don't do that anymore' in reference to waking up with a 'junky' feeling in her chest. She continues to use Trelegy and has used her albuterol  inhaler only a couple of times due to cold air exposure while caring for her horses.  She states, 'the more I move, the better off I am,' describing her experience with activity. She has two horses that she cares for and is active throughout the day.  She has a history of smoking, previously a pack a day for fifteen years, and currently smokes about half a pack a day. She attempts to reduce smoking by staying active and has not used nicotine  replacement therapy before.  She received her flu shot in October 2025 and is up to date on her pneumonia vaccination.   She probably has 50 pack years of smoking history. Her two horses are 47 and 72 years old.   Ancillary information including prior medications, full medical/surgical/family/social histories, and PFTs (when available) are listed below and have been reviewed.      07/01/2024   11:27 AM  CAT Score  Total CAT Score 10    Review of Systems  Constitutional:  Negative for chills, fever and weight loss.  Respiratory:  Positive for cough and shortness of breath. Negative for hemoptysis, sputum production and wheezing.   Cardiovascular:  Negative for chest pain.     Objective:   Vitals:   07/01/24 1055  BP: 112/60  Pulse: 81  Temp: 97.7 F (36.5 C)  TempSrc: Temporal  SpO2: 97%  Weight: 101 lb 12.8 oz (46.2 kg)  Height: 5' 5 (1.651 m)   97% on RA BMI Readings from Last 3 Encounters:  07/01/24 16.94 kg/m  05/28/24 16.74 kg/m  01/04/24 15.48 kg/m    Wt Readings from Last 3 Encounters:  07/01/24 101 lb 12.8 oz (46.2 kg)  05/28/24 100 lb 9.6 oz (45.6 kg)  01/04/24 93 lb (42.2 kg)    Physical Exam Constitutional:      Appearance: Normal appearance. She is not ill-appearing.  Cardiovascular:     Rate and Rhythm: Normal rate and regular rhythm.     Pulses: Normal pulses.     Heart sounds: Normal heart sounds.  Pulmonary:     Effort: Pulmonary effort is normal.     Breath sounds: Normal breath sounds. No wheezing or rales.  Neurological:     General: No focal deficit present.     Mental Status: She is alert and oriented to person, place, and time. Mental status is at baseline.       Ancillary Information    Past Medical History:  Diagnosis Date   Allergy    allerlgic rhinitis   Anxiety    Asthma    Cataract    COPD (chronic obstructive pulmonary disease) (HCC)    COVID    Emphysema of lung (HCC)    Osteoporosis    Pneumonia  Tobacco use disorder      Family History  Problem Relation Age of Onset   Ulcerative colitis Mother        colostomy   Hypertension Mother    Heart Problems Mother    Breast cancer Neg Hx      Past Surgical History:  Procedure Laterality Date   CATARACT EXTRACTION, BILATERAL Bilateral 04/23/2024   05/01/2023   CESAREAN SECTION     TONSILLECTOMY      Social History   Socioeconomic History   Marital status: Widowed    Spouse name: Not on file   Number of children: Not on file   Years of education: Not on file   Highest education level: 12th grade  Occupational History   Not on file  Tobacco Use   Smoking status: Every Day    Current packs/day: 0.50    Average packs/day: 1 pack/day for 50.0 years (49.0 ttl pk-yrs)    Types: Cigarettes    Start date: 1976   Smokeless tobacco: Never  Vaping Use   Vaping status: Never Used  Substance and Sexual Activity   Alcohol use: Never   Drug use: Never   Sexual activity: Not on file  Other Topics Concern   Not on file  Social  History Narrative   Not on file   Social Drivers of Health   Tobacco Use: High Risk (07/01/2024)   Patient History    Smoking Tobacco Use: Every Day    Smokeless Tobacco Use: Never    Passive Exposure: Not on file  Financial Resource Strain: Low Risk (05/27/2024)   Overall Financial Resource Strain (CARDIA)    Difficulty of Paying Living Expenses: Not hard at all  Food Insecurity: No Food Insecurity (05/27/2024)   Epic    Worried About Radiation Protection Practitioner of Food in the Last Year: Never true    Ran Out of Food in the Last Year: Never true  Transportation Needs: No Transportation Needs (05/27/2024)   Epic    Lack of Transportation (Medical): No    Lack of Transportation (Non-Medical): No  Physical Activity: Sufficiently Active (05/27/2024)   Exercise Vital Sign    Days of Exercise per Week: 7 days    Minutes of Exercise per Session: 30 min  Stress: Stress Concern Present (05/27/2024)   [employer] of Occupational Health - Occupational Stress Questionnaire    Feeling of Stress: To some extent  Social Connections: Moderately Integrated (05/27/2024)   Social Connection and Isolation Panel    Frequency of Communication with Friends and Family: More than three times a week    Frequency of Social Gatherings with Friends and Family: More than three times a week    Attends Religious Services: More than 4 times per year    Active Member of [employer] or Organizations: Yes    Attends Banker Meetings: Patient declined    Marital Status: Widowed  Intimate Partner Violence: Not At Risk (02/21/2024)   Epic    Fear of Current or Ex-Partner: No    Emotionally Abused: No    Physically Abused: No    Sexually Abused: No  Depression (PHQ2-9): Low Risk (05/28/2024)   Depression (PHQ2-9)    PHQ-2 Score: 0  Alcohol Screen: Low Risk (02/21/2024)   Alcohol Screen    Last Alcohol Screening Score (AUDIT): 0  Housing: Low Risk (05/27/2024)   Epic    Unable to Pay for Housing in the Last Year: No     Number of Times Moved in  the Last Year: 1    Homeless in the Last Year: No  Utilities: Not At Risk (02/21/2024)   Epic    Threatened with loss of utilities: No  Health Literacy: Adequate Health Literacy (02/21/2024)   B1300 Health Literacy    Frequency of need for help with medical instructions: Never     Allergies[2]   CBC    Component Value Date/Time   WBC 8.6 05/28/2024 1100   WBC 7.0 10/14/2022 1017   RBC 4.47 05/28/2024 1100   RBC 3.93 10/14/2022 1017   HGB 14.3 05/28/2024 1100   HCT 43.4 05/28/2024 1100   PLT 286 05/28/2024 1100   MCV 97 05/28/2024 1100   MCH 32.0 05/28/2024 1100   MCH 31.6 10/14/2022 1017   MCHC 32.9 05/28/2024 1100   MCHC 33.1 10/14/2022 1017   RDW 12.1 05/28/2024 1100   LYMPHSABS 1.7 05/28/2024 1100   MONOABS 0.5 10/14/2022 1017   EOSABS 0.4 05/28/2024 1100   BASOSABS 0.1 05/28/2024 1100    Pulmonary Functions Testing Results:    Latest Ref Rng & Units 10/13/2022    8:43 AM  PFT Results  TLC L 4.29   TLC % Predicted % 82   RV % Predicted % 102     Outpatient Medications Prior to Visit  Medication Sig Dispense Refill   albuterol  (VENTOLIN  HFA) 108 (90 Base) MCG/ACT inhaler INHALE 2 PUFFS INTO THE LUNGS EVERY 6 HOURS AS NEEDED FOR WHEEZING OR SHORTNESS OF BREATH. APPT. FOR FURTHER REFILLS 6.7 each 1   alendronate  (FOSAMAX ) 70 MG tablet Take 1 tablet (70 mg total) by mouth every 7 (seven) days. Take with a full glass of water on an empty stomach. 4 tablet 11   aspirin  EC 81 MG tablet Take 1 tablet (81 mg total) by mouth daily. Swallow whole. 90 tablet 3   azelastine  (ASTELIN ) 0.1 % nasal spray Place 1 spray into both nostrils 2 (two) times daily. Use in each nostril as directed 30 mL 5   ezetimibe  (ZETIA ) 10 MG tablet TAKE 1/2 TABLET BY MOUTH DAILY 30 tablet 1   ipratropium-albuterol  (DUONEB) 0.5-2.5 (3) MG/3ML SOLN Take 3 mLs by nebulization every 6 (six) hours as needed. 360 mL 11   mirtazapine  (REMERON ) 7.5 MG tablet Take 1 tablet (7.5 mg  total) by mouth at bedtime. 90 tablet 1   Misc. Devices (FINGERTIP PULSE OXIMETER) MISC 1 Device by Does not apply route daily. 1 each 0   sodium chloride  (OCEAN) 0.65 % SOLN nasal spray Place 1 spray into both nostrils as needed for congestion (nose irritation). 30 mL 0   Fluticasone-Umeclidin-Vilant (TRELEGY ELLIPTA ) 100-62.5-25 MCG/ACT AEPB Inhale 1 puff into the lungs daily. 60 each 11   magic mouthwash (nystatin , lidocaine, diphenhydrAMINE, alum & mag hydroxide) suspension Swish and spit 5 mLs 4 (four) times daily. 180 mL 0   Facility-Administered Medications Prior to Visit  Medication Dose Route Frequency Provider Last Rate Last Admin   albuterol  (PROVENTIL ) (2.5 MG/3ML) 0.083% nebulizer solution 2.5 mg  2.5 mg Nebulization Once Wert, Michael B, MD          [1]  Current Outpatient Medications:    albuterol  (VENTOLIN  HFA) 108 (90 Base) MCG/ACT inhaler, INHALE 2 PUFFS INTO THE LUNGS EVERY 6 HOURS AS NEEDED FOR WHEEZING OR SHORTNESS OF BREATH. APPT. FOR FURTHER REFILLS, Disp: 6.7 each, Rfl: 1   alendronate  (FOSAMAX ) 70 MG tablet, Take 1 tablet (70 mg total) by mouth every 7 (seven) days. Take with a full glass of  water on an empty stomach., Disp: 4 tablet, Rfl: 11   aspirin  EC 81 MG tablet, Take 1 tablet (81 mg total) by mouth daily. Swallow whole., Disp: 90 tablet, Rfl: 3   azelastine  (ASTELIN ) 0.1 % nasal spray, Place 1 spray into both nostrils 2 (two) times daily. Use in each nostril as directed, Disp: 30 mL, Rfl: 5   ezetimibe  (ZETIA ) 10 MG tablet, TAKE 1/2 TABLET BY MOUTH DAILY, Disp: 30 tablet, Rfl: 1   ipratropium-albuterol  (DUONEB) 0.5-2.5 (3) MG/3ML SOLN, Take 3 mLs by nebulization every 6 (six) hours as needed., Disp: 360 mL, Rfl: 11   mirtazapine  (REMERON ) 7.5 MG tablet, Take 1 tablet (7.5 mg total) by mouth at bedtime., Disp: 90 tablet, Rfl: 1   Misc. Devices (FINGERTIP PULSE OXIMETER) MISC, 1 Device by Does not apply route daily., Disp: 1 each, Rfl: 0   nicotine  polacrilex  (NICOTINE  MINI) 2 MG lozenge, Take 1 lozenge (2 mg total) by mouth every 2 (two) hours as needed for smoking cessation., Disp: 72 lozenge, Rfl: 3   sodium chloride  (OCEAN) 0.65 % SOLN nasal spray, Place 1 spray into both nostrils as needed for congestion (nose irritation)., Disp: 30 mL, Rfl: 0   Fluticasone-Umeclidin-Vilant (TRELEGY ELLIPTA ) 100-62.5-25 MCG/ACT AEPB, Inhale 1 puff into the lungs daily., Disp: 60 each, Rfl: 12 No current facility-administered medications for this visit.  Facility-Administered Medications Ordered in Other Visits:    albuterol  (PROVENTIL ) (2.5 MG/3ML) 0.083% nebulizer solution 2.5 mg, 2.5 mg, Nebulization, Once, Darlean Ozell NOVAK, MD [2]  Allergies Allergen Reactions   Atorvastatin  Other (See Comments)    Myalgias   Codeine Nausea And Vomiting   Levofloxacin Nausea And Vomiting   Sulfonamide Derivatives Hives   Penicillins Hives and Rash   Sulfa Antibiotics Hives and Rash   "

## 2024-07-25 ENCOUNTER — Other Ambulatory Visit: Payer: Self-pay | Admitting: Physician Assistant

## 2024-07-25 DIAGNOSIS — R636 Underweight: Secondary | ICD-10-CM

## 2024-07-25 NOTE — Telephone Encounter (Signed)
 LOV- 05/28/2024 NOV- 12/05/2024 [facility]- 01/04/2024 Outpatient Medication Detail   Disp Refills Start End   mirtazapine  (REMERON ) 7.5 MG tablet 90 tablet 1 01/04/2024 --   Sig - Route: Take 1 tablet (7.5 mg total) by mouth at bedtime. - Oral   Sent to pharmacy as: mirtazapine  (REMERON ) 7.5 MG tablet   E-Prescribing Status: Receipt confirmed by pharmacy (01/04/2024  8:37 AM EDT)

## 2024-12-05 ENCOUNTER — Encounter: Admitting: Physician Assistant

## 2025-02-26 ENCOUNTER — Ambulatory Visit
